# Patient Record
Sex: Female | Born: 1955 | Race: White | Hispanic: No | Marital: Married | Smoking: Never smoker
Health system: Southern US, Community
[De-identification: ages and names within clinical notes are randomized; demographics above are authoritative.]

## PROBLEM LIST (undated history)

## (undated) DIAGNOSIS — I341 Nonrheumatic mitral (valve) prolapse: Secondary | ICD-10-CM

## (undated) DIAGNOSIS — K219 Gastro-esophageal reflux disease without esophagitis: Secondary | ICD-10-CM

## (undated) DIAGNOSIS — H409 Unspecified glaucoma: Secondary | ICD-10-CM

## (undated) DIAGNOSIS — G629 Polyneuropathy, unspecified: Secondary | ICD-10-CM

## (undated) DIAGNOSIS — M810 Age-related osteoporosis without current pathological fracture: Secondary | ICD-10-CM

## (undated) DIAGNOSIS — E785 Hyperlipidemia, unspecified: Secondary | ICD-10-CM

## (undated) DIAGNOSIS — M419 Scoliosis, unspecified: Secondary | ICD-10-CM

## (undated) DIAGNOSIS — K589 Irritable bowel syndrome without diarrhea: Secondary | ICD-10-CM

## (undated) DIAGNOSIS — R011 Cardiac murmur, unspecified: Secondary | ICD-10-CM

## (undated) DIAGNOSIS — F419 Anxiety disorder, unspecified: Secondary | ICD-10-CM

## (undated) HISTORY — DX: Nonrheumatic mitral (valve) prolapse: I34.1

## (undated) HISTORY — DX: Age-related osteoporosis without current pathological fracture: M81.0

## (undated) HISTORY — DX: Scoliosis, unspecified: M41.9

## (undated) HISTORY — DX: Hyperlipidemia, unspecified: E78.5

## (undated) HISTORY — DX: Irritable bowel syndrome, unspecified: K58.9

## (undated) HISTORY — DX: Polyneuropathy, unspecified: G62.9

## (undated) HISTORY — DX: Unspecified glaucoma: H40.9

## (undated) HISTORY — PX: TONSILLECTOMY: SUR1361

---

## 1999-01-03 ENCOUNTER — Other Ambulatory Visit: Admission: RE | Admit: 1999-01-03 | Discharge: 1999-01-03 | Payer: Self-pay | Admitting: Obstetrics and Gynecology

## 2000-11-16 ENCOUNTER — Other Ambulatory Visit: Admission: RE | Admit: 2000-11-16 | Discharge: 2000-11-16 | Payer: Self-pay | Admitting: *Deleted

## 2000-11-30 ENCOUNTER — Encounter: Payer: Self-pay | Admitting: *Deleted

## 2000-11-30 ENCOUNTER — Ambulatory Visit (HOSPITAL_COMMUNITY): Admission: RE | Admit: 2000-11-30 | Discharge: 2000-11-30 | Payer: Self-pay | Admitting: *Deleted

## 2000-12-01 ENCOUNTER — Encounter: Payer: Self-pay | Admitting: *Deleted

## 2006-07-17 ENCOUNTER — Ambulatory Visit: Payer: Self-pay | Admitting: Internal Medicine

## 2006-07-29 ENCOUNTER — Ambulatory Visit: Payer: Self-pay | Admitting: Internal Medicine

## 2006-09-29 ENCOUNTER — Ambulatory Visit: Payer: Self-pay | Admitting: Internal Medicine

## 2007-03-11 DIAGNOSIS — K829 Disease of gallbladder, unspecified: Secondary | ICD-10-CM | POA: Insufficient documentation

## 2007-04-22 ENCOUNTER — Ambulatory Visit (HOSPITAL_COMMUNITY): Admission: RE | Admit: 2007-04-22 | Discharge: 2007-04-22 | Payer: Self-pay | Admitting: Internal Medicine

## 2007-04-23 ENCOUNTER — Ambulatory Visit: Payer: Self-pay | Admitting: Gastroenterology

## 2007-04-23 LAB — CONVERTED CEMR LAB
ALT: 15 units/L (ref 0–35)
AST: 17 units/L (ref 0–37)
Albumin: 4.2 g/dL (ref 3.5–5.2)
Alkaline Phosphatase: 46 units/L (ref 39–117)
BUN: 12 mg/dL (ref 6–23)
Basophils Absolute: 0.1 10*3/uL (ref 0.0–0.1)
Basophils Relative: 1 % (ref 0.0–1.0)
CO2: 32 meq/L (ref 19–32)
Calcium: 9.3 mg/dL (ref 8.4–10.5)
Chloride: 104 meq/L (ref 96–112)
Creatinine, Ser: 0.6 mg/dL (ref 0.4–1.2)
Eosinophils Absolute: 0.3 10*3/uL (ref 0.0–0.6)
Eosinophils Relative: 4.1 % (ref 0.0–5.0)
GFR calc Af Amer: 136 mL/min
GFR calc non Af Amer: 112 mL/min
Glucose, Bld: 118 mg/dL — ABNORMAL HIGH (ref 70–99)
HCT: 43.8 % (ref 36.0–46.0)
Hemoglobin: 14.9 g/dL (ref 12.0–15.0)
Lipase: 26 units/L (ref 11.0–59.0)
Lymphocytes Relative: 30 % (ref 12.0–46.0)
MCHC: 34 g/dL (ref 30.0–36.0)
MCV: 90.5 fL (ref 78.0–100.0)
Monocytes Absolute: 0.8 10*3/uL — ABNORMAL HIGH (ref 0.2–0.7)
Monocytes Relative: 9.5 % (ref 3.0–11.0)
Neutro Abs: 4.7 10*3/uL (ref 1.4–7.7)
Neutrophils Relative %: 55.4 % (ref 43.0–77.0)
Platelets: 268 10*3/uL (ref 150–400)
Potassium: 4.2 meq/L (ref 3.5–5.1)
RBC: 4.84 M/uL (ref 3.87–5.11)
RDW: 11.7 % (ref 11.5–14.6)
Sodium: 142 meq/L (ref 135–145)
Total Bilirubin: 0.6 mg/dL (ref 0.3–1.2)
Total Protein: 7.2 g/dL (ref 6.0–8.3)
WBC: 8.4 10*3/uL (ref 4.5–10.5)

## 2007-04-30 ENCOUNTER — Ambulatory Visit (HOSPITAL_COMMUNITY): Admission: RE | Admit: 2007-04-30 | Discharge: 2007-04-30 | Payer: Self-pay | Admitting: Gastroenterology

## 2007-05-03 ENCOUNTER — Ambulatory Visit: Payer: Self-pay | Admitting: Internal Medicine

## 2007-05-24 ENCOUNTER — Ambulatory Visit: Payer: Self-pay | Admitting: Internal Medicine

## 2007-05-26 DIAGNOSIS — M479 Spondylosis, unspecified: Secondary | ICD-10-CM | POA: Insufficient documentation

## 2007-05-26 DIAGNOSIS — K2900 Acute gastritis without bleeding: Secondary | ICD-10-CM | POA: Insufficient documentation

## 2007-05-26 DIAGNOSIS — D1803 Hemangioma of intra-abdominal structures: Secondary | ICD-10-CM | POA: Insufficient documentation

## 2007-06-17 ENCOUNTER — Ambulatory Visit: Payer: Self-pay | Admitting: Internal Medicine

## 2007-06-17 ENCOUNTER — Encounter: Payer: Self-pay | Admitting: Internal Medicine

## 2010-01-30 ENCOUNTER — Other Ambulatory Visit: Admission: RE | Admit: 2010-01-30 | Discharge: 2010-01-30 | Payer: Self-pay | Admitting: Family Medicine

## 2010-03-31 ENCOUNTER — Encounter: Payer: Self-pay | Admitting: Gastroenterology

## 2010-07-23 NOTE — Assessment & Plan Note (Signed)
[facility]                         GASTROENTEROLOGY OFFICE NOTE   NAME:Nevitt, MASEY SCHEIBER                       MRN:          [id number]  DATE:09/29/2006                            DOB:          [birth year]    NEW PATIENT EVALUATION:  Ms. Millar is a very nice 55 year old patient  of Dr. Seymour Bars, who had a screening colonoscopy with [place] on Jul 29, 2006  and was found to have mild diverticulosis of the left colon.  She at  that time was complaining of some epigastric or subxiphoid discomfort  and I asked her to come and see me if she continues to have problems.  It turned out that Ms. Riel has been having epigastric discomfort for  over a year and was fully evaluated by Dr. Kinnie Scales in July 2007, with  upper endoscopy showing essentially normal anatomy, only mild gastritis  which was attributed to use of NSAIDs; she was at that time taking  ibuprofen.  Ms. Nutting was put on Zegerid 40 mg daily.  She also was  asked not to take any ibuprofen.  She took the medicine for several  weeks, stopped taking ibuprofen and her symptoms subsided.  This spring  when she had an upper respiratory infection and subsequently some sinus  trouble, she resumed taking her ibuprofen and has been through at least  1 or 2 bottles.  When she wakes up in the morning, she experiences  subxiphoid discomfort anteriorly which does not radiate to the back.  She has no other symptoms such as dyspepsia; in fact, eating usually  makes the symptoms better.  She denies excessive stress.  There has been  no dysphagia or odynophagia.  Biopsies on the upper endoscopy did not  show evidence of H. pylori or Barrett's esophagus.   MEDICATIONS:  None.   PAST HISTORY:  1. Allergies.  2. Tonsillectomy at age of 46.  3. Borderline thyroid problems.  4. Gestational diabetes.   FAMILY HISTORY:  Negative for colon cancer.  Heart disease present in  father, uncle and grandmother.   SOCIAL HISTORY:   Married with 2 children.  Master's degree in [employer].  She does not smoke and does not drink.   REVIEW OF SYSTEMS:  Positive for contact lenses, allergies, swelling of  her feet, sleeping problems, night sweats, back pain and heart murmur.   PHYSICAL EXAMINATION:  Blood pressure 100/58, pulse 100 and weight 135  pounds.  She was healthy-appearing, in no distress.  She was about 15 pounds over  her usual weight.  LUNGS:  Clear to auscultation.  No rales or wheezes.  NECK:  Supple.  Thyroid was not enlarged.  Her voice was normal.  COR:  Quiet precordium, normal S1 and normal S2.  CHEST:  Pressure over the costochondral junction did not appear to cause  any pain.  ABDOMEN:  Soft with mild discomfort in the epigastrium and subxiphoid  area in the midline.  It does not radiate to left or right upper  quadrant.  Liver edge at costal margin.  No scars in the abdomen.  Lower  abdomen  was normal.  RECTAL:  Exam not done.   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  Location of the pain suggests  that she has some distal esophagitis or possibly small hiatal hernia.  Her gastroesophageal reflux which occurs at night may be causing morning  pain.  There is nothing to suggest nocturnal aspiration.  The use of  nonsteroidal anti-inflammatory drugs clearly aggravate her  symptomatology and the nonsteroidal anti-inflammatory drugs ought to be  avoided entirely.   PLAN:  I have discussed the etiology of her pain with the patient.  First of all, she will:  1. Stop taking ibuprofen and use Tylenol as needed.  2. Protonix 40 mg daily for about 4 weeks and take it continuously      until pain goes away.  If the pain does not go away, she needs to      come back.  3. Antireflux measures.  4. Patient asked if other organs could be involved in this  and I      mentioned the possibility of gallbladder disease or pancreatitis,      although her symptoms are not typical at all.  If her symptoms      continue, upper abdominal ultrasound would be the next step.  I      also advised her to lose about 10 pounds, which is going to help      her gastroesophageal reflux.     Hedwig Morton. Juanda Chance, MD  Electronically Signed    DMB/MedQ  DD: 09/29/2006  DT: 09/30/2006  Job #: [id number]   cc:   Genia Del, M.D.

## 2010-07-23 NOTE — Assessment & Plan Note (Signed)
[facility]                         GASTROENTEROLOGY OFFICE NOTE   NAME:[name], [name]                       MRN:          [id number]  DATE:05/24/2007                            DOB:          05/09/1955    [name] is a 55 year old white female with epigastric discomfort.  We  saw her with the same problem in the summer of 2008.  Then again, she  had exacerbation about a month ago when she saw [name] on an add-on basis.  The patient had increased her Protonix from 40 mg a day to b.i.d. dose,  and was given a trial on Bentyl.  Symptoms have somewhat improved, but  not significantly.  She is having almost constant discomfort in  subxiphoid area in the midline, radiating into interscapular area, but  most of the time remains anterior.  It bothers her at night.  It is  worse after a large meal.  HIDA scan done with half and half showed only  8.4% ejection fraction.  The half and half did not seem to reproduce her  symptoms.  Increasing her Protonix to 40 mg b.i.d. did not seem to help  any of her symptoms.  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  This  was confirmed on CT scan of the abdomen.  The patient has been under a  great deal of stress related to her husband's work as well as her own  work, and as well as her children.  Her weight has been stable.  She  does not smoke, does not drink alcohol.  She denies taking NSAIDs or  aspirin.   PHYSICAL EXAMINATION:  Blood pressure 100/62, pulse 72, and weight 137  pounds.  She was alert, oriented, in no distress.  LUNGS:  Clear to auscultation.  COR:  Normal S1, normal S2.  ABDOMEN:  Soft with tenderness in the midline subxiphoid area, somewhat  to the right of the midline, but not radiating to any of the upper  quadrants on the left or on the right or to the costovertebral angle.  Liver edge was at costal margin and it did not  appear to be tender.  Lower abdomen was normal.   LABORATORY DATA:  On April 22, 2002 were all normal with normal liver  function test, lipase and CBC.   IMPRESSION:  A 55 year old white female with persistent epigastric  discomfort.  Prior history of gastritis on endoscopy April 2007.  She  has a very abnormal-appearing gallbladder function, having an ejection  fraction of 8.4%, but her symptoms are not really typical of gallbladder  dysfunction.  I still feel we are most likely dealing with either non-  ulcerative dyspepsia or gastroesophageal reflux.   PLAN:  1. Upper endoscopy scheduled.  2. Add Carafate slurry 2 tsp t.i.d.  3. Stay on low-fat diet.  4. May decrease Protonix to 40 mg daily.     [name]. [name] Chance, MD  Electronically Signed    DMB/MedQ  DD: 05/24/2007  DT: 05/24/2007  Job #: [id number]

## 2010-07-23 NOTE — Assessment & Plan Note (Signed)
[facility]                         GASTROENTEROLOGY OFFICE NOTE   NAME:[name], [name] LAVIGNE                       MRN:          [id number]  DATE:04/23/2007                            DOB:          1955-12-18    PROBLEM:  Epigastric pain.   HISTORY:  The patient is a pleasant 55 year old white female known to  [name]. [name], M.D., who was last seen in our office in July of 2008.  She has had problems with persistent epigastric and subxiphoid  discomfort which has been present for at least the past year and a half.  She had undergone endoscopy in July of 2007 with [name],  M.D., which showed normal anatomy and mild gastritis.  H. Pylori was  negative.  At that time it was felt that she had gastritis related to  NSAIDS and was started on PPI therapy.  The patient says she has been  very vigilant about not taking any NSAIDS since that time and has  continued on chronic PPI therapy, currently on Protonix 40 mg daily.  She says she has become worried recently because despite taking the  medication, she continues to have pain and wants to be sure that there  is not some other problems causing her pain.  She has been tried on  Bentyl as well 10 mg twice daily which she is taking, but has not  noticed any improvement in her symptoms.  Her pain is not severe.  She  says it is tolerable, but she is worried.  She has occasional heartburn  and indigestion which she says is a distinctly different feeling from  this epigastric pain.  She describes her pain as a squeezing type  pressure in the epigastrium which is usually present everyday, worse in  the morning on arising and not as noticeable when she is busy during the  day.  She says it generally is exacerbated by eating a lot, but not by  any particular food.  Her appetite has been good.  She has actually  gained some weight recently, particularly she feels in her abdomen.  She  is not having any nausea  and vomiting.  Her bowel movements have been  normal.  She has not noted any melena or hematochezia.  Occasionally has  some problems with constipation.  Again her concern is that the pain  persists and is present on a daily basis.   CURRENT MEDICATIONS:  1. Protonix 40 mg daily.  2. Bentyl 10 mg b.i.d.   ALLERGIES:  No known drug allergies.   PHYSICAL EXAMINATION:  GENERAL:  A well-developed white female in no  acute distress.  VITAL SIGNS:  Weight 137.4, blood pressure 100/62, pulse 80.  HEART:  Regular rate and rhythm with S1 and S2.  No murmurs, rubs, or  gallops.  LUNGS:  Clear to A&P.  ABDOMEN:  Soft.  Bowel sounds are active.  She is tender in the  epigastrium.  There is no chest wall tenderness and no xiphoid or  sternal tenderness elicited.  NO palpable mass or hepatosplenomegaly.  No guarding or rebound.  Bowel sounds are present.  RECTAL:  Not done today.   IMPRESSION:  25. A 55 year old female with persistent epigastric pain x1-1/2 years.      Etiology not clear.  Symptoms possibly related to gastroesophageal      reflux disease or esophageal spasm.  2. Persistent gastropathy, rule out occult intra-abdominal lesion.      Rule out biliary dyskinesia.   PLAN:  1. Increase Protonix to 40 mg b.i.d. as a trial for the next 30 days.  2. Check CBC, CMET, and lipase.  3. Schedule [facility] HIDA scan and CT scan of the abdomen and pelvis.      Further workup pending findings of above.  She will follow up with      Dr. Juanda [name].      [name] Gip, PA-C  Electronically Signed      [name]. [name] Dar, MD, [facility]  Electronically Signed   AE/MedQ  DD: 04/23/2007  DT: 04/26/2007  Job #: [id number]   cc:   [name]. [name] Chance, MD

## 2012-04-12 ENCOUNTER — Other Ambulatory Visit: Payer: Self-pay | Admitting: Dermatology

## 2013-11-30 ENCOUNTER — Encounter (HOSPITAL_COMMUNITY): Payer: Self-pay | Admitting: Pharmacy Technician

## 2013-11-30 NOTE — Pre-Procedure Instructions (Signed)
[name]  11/30/2013   Your procedure is scheduled on:  Friday, December 09, 2013 at 10:30 AM.   Report to [facility] Entrance "A" Admitting Office at 8:30 AM.   Call this number if you have problems the morning of surgery: [phone]   Remember:   Do not eat food or drink liquids after midnight Thursday, 12/08/13.   Take these medicines the morning of surgery with A SIP OF WATER: You may use your eye drops   Do not wear jewelry, make-up or nail polish.  Do not wear lotions, powders, or perfumes. You may NOT wear deodorant.  Do not shave 48 hours prior to surgery.   Do not bring valuables to the hospital.  [facility] is not responsible                  for any belongings or valuables.               Contacts, dentures or bridgework may not be worn into surgery.  Leave suitcase in the car. After surgery it may be brought to your room.  For patients admitted to the hospital, discharge time is determined by your                treatment team.               Patients discharged the day of surgery will not be allowed to drive home.    Special Instructions: [facility] - Preparing for Surgery  Before surgery, you can play an important role.  Because skin is not sterile, your skin needs to be as free of germs as possible.  You can reduce the number of germs on you skin by washing with CHG (chlorahexidine gluconate) soap before surgery.  CHG is an antiseptic cleaner which kills germs and bonds with the skin to continue killing germs even after washing.  Please DO NOT use if you have an allergy to CHG or antibacterial soaps.  If your skin becomes reddened/irritated stop using the CHG and inform your nurse when you arrive at Short Stay.  Do not shave (including legs and underarms) for at least 48 hours prior to the first CHG shower.  You may shave your face.  Please follow these instructions carefully:   1.  Shower with CHG Soap the night before surgery and the                                 morning of Surgery.  2.  If you choose to wash your hair, wash your hair first as usual with your       normal shampoo.  3.  After you shampoo, rinse your hair and body thoroughly to remove the                      Shampoo.  4.  Use CHG as you would any other liquid soap.  You can apply chg directly       to the skin and wash gently with scrungie or a clean washcloth.  5.  Apply the CHG Soap to your body ONLY FROM THE NECK DOWN.        Do not use on open wounds or open sores.  Avoid contact with your eyes, ears, mouth and genitals (private parts).  Wash genitals (private parts) with your normal soap.  6.  Wash thoroughly, paying special  attention to the area where your surgery        will be performed.  7.  Thoroughly rinse your body with warm water from the neck down.  8.  DO NOT shower/wash with your normal soap after using and rinsing off       the CHG Soap.  9.  Pat yourself dry with a clean towel.            10.  Wear clean pajamas.            11.  Place clean sheets on your bed the night of your first shower and do not        sleep with pets.  Day of Surgery  Do not apply any lotions/deodorants the morning of surgery.  Please wear clean clothes to the hospital/surgery center.     Please read over the following fact sheets that you were given: Pain Booklet, Coughing and Deep Breathing and Surgical Site Infection Prevention

## 2013-12-01 ENCOUNTER — Encounter (HOSPITAL_COMMUNITY)
Admission: RE | Admit: 2013-12-01 | Discharge: 2013-12-01 | Disposition: A | Discharge status: Home / Self Care | Payer: 59 | Source: Ambulatory Visit | Attending: Orthopedic Surgery | Admitting: Orthopedic Surgery

## 2013-12-01 ENCOUNTER — Encounter (HOSPITAL_COMMUNITY): Payer: Self-pay

## 2013-12-01 DIAGNOSIS — X58XXXA Exposure to other specified factors, initial encounter: Secondary | ICD-10-CM | POA: Insufficient documentation

## 2013-12-01 DIAGNOSIS — M67919 Unspecified disorder of synovium and tendon, unspecified shoulder: Secondary | ICD-10-CM | POA: Insufficient documentation

## 2013-12-01 DIAGNOSIS — S43439A Superior glenoid labrum lesion of unspecified shoulder, initial encounter: Secondary | ICD-10-CM | POA: Insufficient documentation

## 2013-12-01 DIAGNOSIS — M719 Bursopathy, unspecified: Principal | ICD-10-CM | POA: Insufficient documentation

## 2013-12-01 DIAGNOSIS — Z01812 Encounter for preprocedural laboratory examination: Secondary | ICD-10-CM | POA: Insufficient documentation

## 2013-12-01 HISTORY — DX: Anxiety disorder, unspecified: F41.9

## 2013-12-01 HISTORY — DX: Gastro-esophageal reflux disease without esophagitis: K21.9

## 2013-12-01 LAB — CBC
HEMATOCRIT: 44.7 % (ref 36.0–46.0)
Hemoglobin: 15 g/dL (ref 12.0–15.0)
MCH: 30.1 pg (ref 26.0–34.0)
MCHC: 33.6 g/dL (ref 30.0–36.0)
MCV: 89.6 fL (ref 78.0–100.0)
Platelets: 294 10*3/uL (ref 150–400)
RBC: 4.99 MIL/uL (ref 3.87–5.11)
RDW: 12.5 % (ref 11.5–15.5)
WBC: 6.2 10*3/uL (ref 4.0–10.5)

## 2013-12-01 NOTE — Progress Notes (Signed)
DR. Veverly Fells OFFICE CALLED FOR ORDERS.

## 2013-12-06 NOTE — H&P (Signed)
  [name] is an 58 y.o. female.    Chief Complaint: right shoulder pain and weakness  HPI: Pt is a 58 y.o. female complaining of right shoulder pain for multiple months. Pain had continually increased since the beginning. X-rays in the clinic show rotator cuff tear right shoulder. Pt has tried various conservative treatments which have failed to alleviate their symptoms, including injections and therapy. Various options are discussed with the patient. Risks, benefits and expectations were discussed with the patient. Patient understand the risks, benefits and expectations and wishes to proceed with surgery.   PCP:  Marjorie Smolder, MD  D/C Plans:  Home   PMH: Past Medical History  Diagnosis Date  . Anxiety     very high  . GERD (gastroesophageal reflux disease)     PSH: Past Surgical History  Procedure Laterality Date  . Cesarean section    . Tonsillectomy      Social History:  reports that she has never smoked. She does not have any smokeless tobacco history on file. She reports that she drinks alcohol. She reports that she does not use illicit drugs.  Allergies:  No Known Allergies  Medications: No current facility-administered medications for this encounter.   Current Outpatient Prescriptions  Medication Sig Dispense Refill  . calcium carbonate (TUMS - DOSED IN MG ELEMENTAL CALCIUM) 500 MG chewable tablet Chew 1 tablet by mouth daily as needed for indigestion or heartburn.      . Cholecalciferol (VITAMIN D) 2000 UNITS CAPS Take 2,000 Units by mouth daily.      . cycloSPORINE (RESTASIS) 0.05 % ophthalmic emulsion Place 1 drop into both eyes 2 (two) times daily.      Marland Kitchen aspirin 81 MG chewable tablet Chew 81 mg by mouth daily.      . Omega-3 Fatty Acids (FISH OIL PO) Take 1 capsule by mouth daily.        No results found for this or any previous visit (from the past 48 hour(s)). No results found.  ROS: Pain with rom of the right upper extremity  Physical  Exam:  Alert and oriented 58 y.o. female in no acute distress Cranial nerves 2-12 intact Cervical spine: full rom with no tenderness, nv intact distally Chest: active breath sounds bilaterally, no wheeze rhonchi or rales Heart: regular rate and rhythm, no murmur Abd: non tender non distended with active bowel sounds Hip is stable with rom  Right shoulder with adequate rom Weakness and pain with ER and IR testing  nv intact distally No rashes or edema  Assessment/Plan Assessment: right shoulder rotator cuff tear  Plan: Patient will undergo a right shoulder rotator cuff repair by Dr. Veverly Fells at [facility]. Risks benefits and expectations were discussed with the patient. Patient understand risks, benefits and expectations and wishes to proceed.

## 2013-12-08 MED ORDER — CEFAZOLIN SODIUM-DEXTROSE 2-3 GM-% IV SOLR
2.0000 g | INTRAVENOUS | Status: AC
  Administered 2013-12-09: 2 g via INTRAVENOUS
  Filled 2013-12-08: qty 50

## 2013-12-09 ENCOUNTER — Encounter (HOSPITAL_COMMUNITY): Payer: 59 | Admitting: Anesthesiology

## 2013-12-09 ENCOUNTER — Encounter (HOSPITAL_COMMUNITY): Payer: Self-pay | Admitting: *Deleted

## 2013-12-09 ENCOUNTER — Ambulatory Visit (HOSPITAL_COMMUNITY)
Admission: RE | Admit: 2013-12-09 | Discharge: 2013-12-09 | Disposition: A | Discharge status: Home / Self Care | Payer: 59 | Source: Ambulatory Visit | Attending: Orthopedic Surgery | Admitting: Orthopedic Surgery

## 2013-12-09 ENCOUNTER — Encounter (HOSPITAL_COMMUNITY)
Admission: RE | Disposition: A | Discharge status: Home / Self Care | Payer: Self-pay | Source: Ambulatory Visit | Attending: Orthopedic Surgery

## 2013-12-09 ENCOUNTER — Ambulatory Visit (HOSPITAL_COMMUNITY): Payer: 59 | Admitting: Anesthesiology

## 2013-12-09 DIAGNOSIS — K219 Gastro-esophageal reflux disease without esophagitis: Secondary | ICD-10-CM | POA: Insufficient documentation

## 2013-12-09 DIAGNOSIS — S43431A Superior glenoid labrum lesion of right shoulder, initial encounter: Secondary | ICD-10-CM | POA: Diagnosis not present

## 2013-12-09 DIAGNOSIS — F419 Anxiety disorder, unspecified: Secondary | ICD-10-CM | POA: Insufficient documentation

## 2013-12-09 DIAGNOSIS — M75101 Unspecified rotator cuff tear or rupture of right shoulder, not specified as traumatic: Secondary | ICD-10-CM | POA: Insufficient documentation

## 2013-12-09 DIAGNOSIS — X58XXXA Exposure to other specified factors, initial encounter: Secondary | ICD-10-CM | POA: Insufficient documentation

## 2013-12-09 HISTORY — DX: Cardiac murmur, unspecified: R01.1

## 2013-12-09 HISTORY — PX: SHOULDER ARTHROSCOPY WITH SUBACROMIAL DECOMPRESSION AND OPEN ROTATOR C: SHX5688

## 2013-12-09 SURGERY — SHOULDER ARTHROSCOPY WITH SUBACROMIAL DECOMPRESSION AND OPEN ROTATOR CUFF REPAIR, OPEN BICEPS TENDON REPAIR
Anesthesia: General | Site: Shoulder | Laterality: Right

## 2013-12-09 MED ORDER — KETOROLAC TROMETHAMINE 30 MG/ML IJ SOLN
30.0000 mg | Freq: Once | INTRAMUSCULAR | Status: AC
  Administered 2013-12-09: 30 mg via INTRAVENOUS

## 2013-12-09 MED ORDER — SUCCINYLCHOLINE CHLORIDE 20 MG/ML IJ SOLN
INTRAMUSCULAR | Status: DC | PRN
  Administered 2013-12-09: 100 mg via INTRAVENOUS

## 2013-12-09 MED ORDER — OXYCODONE-ACETAMINOPHEN 5-325 MG PO TABS: 1.0000 | ORAL_TABLET | ORAL | Status: DC | PRN

## 2013-12-09 MED ORDER — LIDOCAINE HCL (CARDIAC) 20 MG/ML IV SOLN
INTRAVENOUS | Status: AC
  Filled 2013-12-09: qty 5

## 2013-12-09 MED ORDER — LIDOCAINE HCL (CARDIAC) 20 MG/ML IV SOLN
INTRAVENOUS | Status: DC | PRN
  Administered 2013-12-09: 30 mg via INTRAVENOUS

## 2013-12-09 MED ORDER — BUPIVACAINE-EPINEPHRINE 0.25% -1:200000 IJ SOLN
INTRAMUSCULAR | Status: DC | PRN
  Administered 2013-12-09: 10 mL

## 2013-12-09 MED ORDER — HYDROMORPHONE HCL 2 MG PO TABS: 2.0000 mg | ORAL_TABLET | ORAL | Status: DC | PRN

## 2013-12-09 MED ORDER — BUPIVACAINE-EPINEPHRINE (PF) 0.25% -1:200000 IJ SOLN
INTRAMUSCULAR | Status: AC
  Filled 2013-12-09: qty 30

## 2013-12-09 MED ORDER — METHOCARBAMOL 500 MG PO TABS
500.0000 mg | ORAL_TABLET | Freq: Once | ORAL | Status: AC
  Administered 2013-12-09: 500 mg via ORAL

## 2013-12-09 MED ORDER — BUPIVACAINE-EPINEPHRINE (PF) 0.5% -1:200000 IJ SOLN
INTRAMUSCULAR | Status: DC | PRN
  Administered 2013-12-09: 20 mL via PERINEURAL

## 2013-12-09 MED ORDER — ROCURONIUM BROMIDE 50 MG/5ML IV SOLN
INTRAVENOUS | Status: AC
  Filled 2013-12-09: qty 1

## 2013-12-09 MED ORDER — LACTATED RINGERS IV SOLN
INTRAVENOUS | Status: DC
  Administered 2013-12-09: 09:00:00 via INTRAVENOUS

## 2013-12-09 MED ORDER — FENTANYL CITRATE 0.05 MG/ML IJ SOLN
50.0000 ug | INTRAMUSCULAR | Status: DC | PRN
  Administered 2013-12-09: 75 ug via INTRAVENOUS

## 2013-12-09 MED ORDER — CHLORHEXIDINE GLUCONATE 4 % EX LIQD
60.0000 mL | Freq: Once | CUTANEOUS | Status: DC
  Filled 2013-12-09: qty 60

## 2013-12-09 MED ORDER — FENTANYL CITRATE 0.05 MG/ML IJ SOLN
INTRAMUSCULAR | Status: AC
  Filled 2013-12-09: qty 5

## 2013-12-09 MED ORDER — PROPOFOL 10 MG/ML IV BOLUS
INTRAVENOUS | Status: AC
  Filled 2013-12-09: qty 20

## 2013-12-09 MED ORDER — KETOROLAC TROMETHAMINE 30 MG/ML IJ SOLN
INTRAMUSCULAR | Status: AC
  Filled 2013-12-09: qty 1

## 2013-12-09 MED ORDER — OXYCODONE HCL 5 MG PO TABS
5.0000 mg | ORAL_TABLET | Freq: Once | ORAL | Status: AC | PRN
  Administered 2013-12-09: 5 mg via ORAL

## 2013-12-09 MED ORDER — FENTANYL CITRATE 0.05 MG/ML IJ SOLN
INTRAMUSCULAR | Status: AC
  Administered 2013-12-09: 75 ug via INTRAVENOUS
  Filled 2013-12-09: qty 2

## 2013-12-09 MED ORDER — OXYCODONE-ACETAMINOPHEN 5-325 MG PO TABS
ORAL_TABLET | ORAL | Status: AC
  Filled 2013-12-09: qty 1

## 2013-12-09 MED ORDER — OXYCODONE HCL 5 MG/5ML PO SOLN: 5.0000 mg | Freq: Once | ORAL | Status: AC | PRN

## 2013-12-09 MED ORDER — MIDAZOLAM HCL 2 MG/2ML IJ SOLN
INTRAMUSCULAR | Status: AC
  Administered 2013-12-09: 1.5 mg via INTRAVENOUS
  Filled 2013-12-09: qty 2

## 2013-12-09 MED ORDER — FENTANYL CITRATE 0.05 MG/ML IJ SOLN
INTRAMUSCULAR | Status: DC | PRN
  Administered 2013-12-09: 50 ug via INTRAVENOUS
  Administered 2013-12-09: 100 ug via INTRAVENOUS

## 2013-12-09 MED ORDER — PROPOFOL 10 MG/ML IV BOLUS
INTRAVENOUS | Status: DC | PRN
  Administered 2013-12-09: 160 mg via INTRAVENOUS

## 2013-12-09 MED ORDER — METHOCARBAMOL 500 MG PO TABS
ORAL_TABLET | ORAL | Status: AC
  Filled 2013-12-09: qty 1

## 2013-12-09 MED ORDER — MIDAZOLAM HCL 2 MG/2ML IJ SOLN
1.0000 mg | INTRAMUSCULAR | Status: DC | PRN
  Administered 2013-12-09: 1.5 mg via INTRAVENOUS

## 2013-12-09 MED ORDER — OXYCODONE HCL 5 MG PO TABS
ORAL_TABLET | ORAL | Status: DC
Start: 2013-12-09 — End: 2013-12-09
  Filled 2013-12-09: qty 1

## 2013-12-09 MED ORDER — PHENYLEPHRINE HCL 10 MG/ML IJ SOLN
10.0000 mg | INTRAMUSCULAR | Status: DC | PRN
  Administered 2013-12-09: 20 ug/min via INTRAVENOUS

## 2013-12-09 MED ORDER — PROMETHAZINE HCL 25 MG/ML IJ SOLN: 6.2500 mg | INTRAMUSCULAR | Status: DC | PRN

## 2013-12-09 MED ORDER — PROMETHAZINE HCL 25 MG PO TABS: 25.0000 mg | ORAL_TABLET | Freq: Four times a day (QID) | ORAL | Status: DC | PRN

## 2013-12-09 MED ORDER — SODIUM CHLORIDE 0.9 % IR SOLN
Status: DC | PRN
  Administered 2013-12-09: 6000 mL

## 2013-12-09 MED ORDER — ONDANSETRON HCL 4 MG/2ML IJ SOLN
INTRAMUSCULAR | Status: DC | PRN
  Administered 2013-12-09: 4 mg via INTRAVENOUS

## 2013-12-09 MED ORDER — ONDANSETRON HCL 4 MG/2ML IJ SOLN
INTRAMUSCULAR | Status: AC
  Filled 2013-12-09: qty 2

## 2013-12-09 MED ORDER — HYDROMORPHONE HCL 1 MG/ML IJ SOLN: 0.2500 mg | INTRAMUSCULAR | Status: DC | PRN

## 2013-12-09 MED ORDER — METHOCARBAMOL 500 MG PO TABS: 500.0000 mg | ORAL_TABLET | Freq: Three times a day (TID) | ORAL | Status: DC | PRN

## 2013-12-09 SURGICAL SUPPLY — 67 items
ANCH SUT 2 1.3X1 LD 1 STRN (Anchor) ×1 IMPLANT
ANCH SUT 360D 2 2 LD 2 STRN (Anchor) ×1 IMPLANT
ANCHOR ALL- SUT RC 2 SUT Y-K (Anchor) ×1 IMPLANT
ANCHOR ALL-SUT FLEX 1.3 Y-KNOT (Anchor) ×1 IMPLANT
ANCHOR ALL-SUT RC 2 SUT Y-K (Anchor) IMPLANT
BIT DRILL 1.3M DISPOSABLE (BIT) ×1 IMPLANT
BLADE SURG 11 STRL SS (BLADE) ×2 IMPLANT
BLADE W/14.0X25.5MM (BLADE) ×2 IMPLANT
BUR OVAL 4.0 (BURR) IMPLANT
CLSR STERI-STRIP ANTIMIC 1/2X4 (GAUZE/BANDAGES/DRESSINGS) ×1 IMPLANT
COVER SURGICAL LIGHT HANDLE (MISCELLANEOUS) ×2 IMPLANT
DRAPE INCISE IOBAN 66X45 STRL (DRAPES) ×2 IMPLANT
DRAPE STERI 35X30 U-POUCH (DRAPES) ×2 IMPLANT
DRAPE U-SHAPE 47X51 STRL (DRAPES) ×2 IMPLANT
DRSG ADAPTIC 3X8 NADH LF (GAUZE/BANDAGES/DRESSINGS) ×1 IMPLANT
DRSG EMULSION OIL 3X3 NADH (GAUZE/BANDAGES/DRESSINGS) ×4 IMPLANT
DRSG PAD ABDOMINAL 8X10 ST (GAUZE/BANDAGES/DRESSINGS) ×3 IMPLANT
DURAPREP 26ML APPLICATOR (WOUND CARE) ×2 IMPLANT
ELECT NDL TIP 2.8 STRL (NEEDLE) ×1 IMPLANT
ELECT NEEDLE TIP 2.8 STRL (NEEDLE) ×2 IMPLANT
ELECT REM PT RETURN 9FT ADLT (ELECTROSURGICAL) ×2
ELECTRODE REM PT RTRN 9FT ADLT (ELECTROSURGICAL) IMPLANT
GAUZE SPONGE 4X4 12PLY STRL (GAUZE/BANDAGES/DRESSINGS) ×2 IMPLANT
GLOVE BIOGEL PI ORTHO PRO 7.5 (GLOVE) ×1
GLOVE BIOGEL PI ORTHO PRO SZ8 (GLOVE) ×1
GLOVE ORTHO TXT STRL SZ7.5 (GLOVE) ×2 IMPLANT
GLOVE PI ORTHO PRO STRL 7.5 (GLOVE) ×1 IMPLANT
GLOVE PI ORTHO PRO STRL SZ8 (GLOVE) ×1 IMPLANT
GLOVE SURG ORTHO 8.5 STRL (GLOVE) ×2 IMPLANT
GOWN STRL REUS W/ TWL XL LVL3 (GOWN DISPOSABLE) ×4 IMPLANT
GOWN STRL REUS W/TWL XL LVL3 (GOWN DISPOSABLE) ×8
KIT BASIN OR (CUSTOM PROCEDURE TRAY) ×2 IMPLANT
KIT ROOM TURNOVER OR (KITS) ×2 IMPLANT
MANIFOLD NEPTUNE II (INSTRUMENTS) ×2 IMPLANT
NDL HYPO 25GX1X1/2 BEV (NEEDLE) ×1 IMPLANT
NDL SPNL 18GX3.5 QUINCKE PK (NEEDLE) ×1 IMPLANT
NDL SUT .5 MAYO 1.404X.05X (NEEDLE) ×1 IMPLANT
NDL SUT 6 .5 CRC .975X.05 MAYO (NEEDLE) ×1 IMPLANT
NEEDLE HYPO 25GX1X1/2 BEV (NEEDLE) ×2 IMPLANT
NEEDLE MAYO TAPER (NEEDLE) ×4
NEEDLE SPNL 18GX3.5 QUINCKE PK (NEEDLE) ×2 IMPLANT
NS IRRIG 1000ML POUR BTL (IV SOLUTION) ×2 IMPLANT
PACK SHOULDER (CUSTOM PROCEDURE TRAY) ×2 IMPLANT
PAD ARMBOARD 7.5X6 YLW CONV (MISCELLANEOUS) ×4 IMPLANT
RESECTOR FULL RADIUS 4.2MM (BLADE) ×2 IMPLANT
SET ARTHROSCOPY TUBING (MISCELLANEOUS) ×2
SET ARTHROSCOPY TUBING LN (MISCELLANEOUS) ×1 IMPLANT
SLING ARM LRG ADULT FOAM STRAP (SOFTGOODS) ×2 IMPLANT
SLING ARM MED ADULT FOAM STRAP (SOFTGOODS) IMPLANT
STRIP CLOSURE SKIN 1/2X4 (GAUZE/BANDAGES/DRESSINGS) ×2 IMPLANT
SUT BONE WAX W31G (SUTURE) ×2 IMPLANT
SUT FIBERWIRE #2 38 T-5 BLUE (SUTURE)
SUT MNCRL AB 3-0 PS2 18 (SUTURE) ×2 IMPLANT
SUT VIC AB 0 CT1 27 (SUTURE) ×2
SUT VIC AB 0 CT1 27XBRD ANBCTR (SUTURE) ×1 IMPLANT
SUT VIC AB 0 CT2 27 (SUTURE) IMPLANT
SUT VIC AB 2-0 CT1 27 (SUTURE) ×2
SUT VIC AB 2-0 CT1 TAPERPNT 27 (SUTURE) ×1 IMPLANT
SUT VICRYL 0 CT 1 36IN (SUTURE) ×2 IMPLANT
SUTURE FIBERWR #2 38 T-5 BLUE (SUTURE) IMPLANT
SYR CONTROL 10ML LL (SYRINGE) ×2 IMPLANT
TAPE CLOTH SURG 6X10 WHT LF (GAUZE/BANDAGES/DRESSINGS) ×1 IMPLANT
TOWEL OR 17X24 6PK STRL BLUE (TOWEL DISPOSABLE) ×2 IMPLANT
TOWEL OR 17X26 10 PK STRL BLUE (TOWEL DISPOSABLE) ×2 IMPLANT
TUBE CONNECTING 12X1/4 (SUCTIONS) ×2 IMPLANT
WAND HAND CNTRL MULTIVAC 90 (MISCELLANEOUS) ×2 IMPLANT
WATER STERILE IRR 1000ML POUR (IV SOLUTION) ×2 IMPLANT

## 2013-12-09 NOTE — Transfer of Care (Signed)
Immediate Anesthesia Transfer of Care Note  Patient: [name]  Procedure(s) Performed: Procedure(s): RIGHT SHOULDER ARTHROSCOPY WITH SUBACROMIAL DECOMPRESSION AND MINI OPEN ROTATOR CUFF REPAIR, AND BICEPS TENDODESIS (Right)  Patient Location: PACU  Anesthesia Type:General  Level of Consciousness: awake, alert  and oriented  Airway & Oxygen Therapy: Patient Spontanous Breathing and Patient connected to nasal cannula oxygen  Post-op Assessment: Report given to PACU RN, Post -op Vital signs reviewed and stable and Patient moving all extremities  Post vital signs: Reviewed and stable  Complications: No apparent anesthesia complications

## 2013-12-09 NOTE — Anesthesia Preprocedure Evaluation (Addendum)
Anesthesia Evaluation  Patient identified by MRN, date of birth, ID band Patient awake    Reviewed: Allergy & Precautions, H&P , NPO status , Patient's Chart, lab work & pertinent test results  History of Anesthesia Complications Negative for: history of anesthetic complications  Airway Mallampati: I      Dental  (+) Teeth Intact   Pulmonary neg pulmonary ROS,  breath sounds clear to auscultation        Cardiovascular Rhythm:Regular Rate:Normal     Neuro/Psych negative neurological ROS     GI/Hepatic GERD-  ,  Endo/Other    Renal/GU      Musculoskeletal  (+) Arthritis -,   Abdominal   Peds  Hematology   Anesthesia Other Findings   Reproductive/Obstetrics                          Anesthesia Physical Anesthesia Plan  ASA: I  Anesthesia Plan: General   Post-op Pain Management:    Induction: Intravenous  Airway Management Planned: Oral ETT  Additional Equipment:   Intra-op Plan:   Post-operative Plan: Extubation in OR  Informed Consent: I have reviewed the patients History and Physical, chart, labs and discussed the procedure including the risks, benefits and alternatives for the proposed anesthesia with the patient or authorized representative who has indicated his/her understanding and acceptance.   Dental advisory given  Plan Discussed with: CRNA and Surgeon  Anesthesia Plan Comments:        Anesthesia Quick Evaluation

## 2013-12-09 NOTE — Progress Notes (Signed)
Orthopedic Tech Progress Note Patient Details:  [name] September 21, 1955 [id number] Delivered to OR desk Ortho Devices Type of Ortho Device: Shoulder abduction pillow Ortho Device/Splint Interventions: Ordered   Asia R Thompson 12/09/2013, 1:20 PM

## 2013-12-09 NOTE — Op Note (Signed)
[name], [name]                ACCOUNT NO.:  [id number]  MEDICAL RECORD NO.:  [id number]  LOCATION:  [facility]                         FACILITY:  [facility]  PHYSICIAN:  Doran Heater. Veverly Fells, M.D. DATE OF BIRTH:  [birth year]  DATE OF PROCEDURE:  12/09/2013 DATE OF DISCHARGE:  12/09/2013                              OPERATIVE REPORT   PREOPERATIVE DIAGNOSIS:  Right shoulder rotator cuff tear and SLAP lesion.  POSTOPERATIVE DIAGNOSIS:  Right shoulder rotator cuff tear and SLAP lesion.  PROCEDURE PERFORMED:  Right shoulder arthroscopy with extensive intra- articular debridement of torn superior labrum, anterior to posterior arthroscopic biceps tenotomy, arthroscopic subacromial decompression with CA ligament release followed by mini open rotator cuff repair and biceps tenodesis in the groove.  ATTENDING SURGEON:  Doran Heater. Veverly Fells, MD.  ASSISTANT:  Abbott Pao. Dixon, PA-C, who was scrubbed the entire procedure and necessary for satisfactory completion of surgery.  ANESTHESIA:  General anesthesia was used plus interscalene block.  ESTIMATED BLOOD LOSS:  Minimal.  FLUID REPLACEMENT:  1200 mL crystalloids.  INSTRUMENT COUNTS:  Correct.  COMPLICATIONS:  There were no complications.  ANTIBIOTICS:  Perioperative antibiotics were given.  INDICATIONS:  The patient is a 58 year old female with worsening shoulder pain secondary to an MRI documented rotator cuff tear and SLAP lesion.  The patient has had progressive pain despite conservative management, desires operative treatment to restore function, eliminate pain to her shoulder.  Informed consent obtained.  DESCRIPTION OF PROCEDURE:  After an adequate level of anesthesia achieved, the patient was positioned in the modified beach-chair position.  Right shoulder was correctly identified and examined under anesthesia.  The patient had full passive range of motion.  She actually had hyper-external rotation with her elbow at her side.  The  patient had a negative sulcus, negative anterior-posterior drawer.  After sterile prep and drape of the shoulder and arm, time-out was called.  We entered the shoulder in standard portals including anterior, posterior, and lateral portals.  We identified significant tearing in the superior labral biceps junction.  We performed a biceps tenotomy and labral debridement back to stable labral rim.  Anterior-inferior and posterior- inferior labrum intact.  Articular cartilage in the shoulder normal. Subscapularis rolled edge normal.  Rotator cuff supraspinatus torn, basically with the very front edge of the supraspinatus for about 1 cm. We reversed the scope and looked posteriorly.  The teres minor and infraspinatus appeared normal.  We placed the scope in subacromial space.  Thorough bursectomy and acromioplasty was performed, creating a type 1 acromial shape with the butcher block technique utilizing a high- speed burr with a thorough decompression of rotator cuff outlet including release of the CA ligament.  Rotator cuff was clearly torn. We could see the tear from the bursal side.  We then concluded the arthroscopic portion of procedure.  We made a longitudinal incision, mini open type incision starting at the anterolateral border of the acromion, extending distally about 4 cm, dissection down to the subcutaneous tissues.  We identified the deltoid __________ between the anterior and lateral heads and divided that using needle-tip Bovie.  We placed our Arthrex retractor, identified the biceps tendon, delivered the tendon out of the soft  tissue sleeve, then prepared the floor of the biceps groove with a needle-tip Bovie and a Soil scientist.  We placed a single Y-Knot Flex anchor through the floor of the biceps groove and brought that up in a whipstitch fashion through the tendon mid tension with the elbow at 90 degrees.  We had nice low-profile biceps tenodesis. We then went ahead and  oversewed with 0 Vicryl suture figure-of-eight x2 into the pec major tendon just to reinforce the repair.  We left the sutures attached to the biceps tenodesis anchor just in case we needed to augment for the rotator cuff.  We then inspected the rotator cuff tear.  This was a interesting tear pattern.  There seem to be basically a tear that started at the rotator interval and extended posterior medially back into the supraspinatus.  Fortunately, the tendon mobilized easily back into an anatomic position without any __________.  We prepared the greater tuberosity with a rongeur and curette such that we got the bleeding bone.  We placed #2 hi-fi suture x3 into the free edge of the tendon and used that through drill holes out in the bone for the lateral part of the footprint.  We used a single Y-Knot RC anchor at the articular margin, double ligated with #2 hi-fi suture in a mattress fashion for the medial part of the footprint.  We tied all our sutures down.  We had nice anatomic repair, not under any tension.  We ranged her shoulder fully, no impingement.  Interestingly, the patient's hyper- external rotation actually became nice at about 70 degrees with a good end point, or likely the patient had not only the rotator cuff tear but also some rotator interval insufficiency as a cause for all that external rotation.  We then went ahead and checked her subscap, glided nicely, there did not seem to be any significant tethering of that rotator interval, just a tightening of that.  The motion through a neutral arc was very smooth and did not seem to be tethered or caught. We ranged the shoulder.  We thoroughly irrigated the subdeltoid interval, repaired the deltoid anatomically with 0 Vicryl suture, followed by 2-0 Vicryl subcutaneous closure and 4-0 Monocryl for skin and portals.  Steri-Strips applied followed by sterile dressing.  The patient tolerated the surgery well.     Doran Heater.  Veverly Fells, M.D.     SRN/MEDQ  D:  12/09/2013  T:  12/09/2013  Job:  [id number]

## 2013-12-09 NOTE — Discharge Instructions (Signed)
What to eat:  For your first meals, you should eat lightly; only small meals initially.  If you do not have nausea, you may eat larger meals.  Avoid spicy, greasy and heavy food.    General Anesthesia, Adult, Care After  Refer to this sheet in the next few weeks. These instructions provide you with information on caring for yourself after your procedure. Your health care provider may also give you more specific instructions. Your treatment has been planned according to current medical practices, but problems sometimes occur. Call your health care provider if you have any problems or questions after your procedure.  WHAT TO EXPECT AFTER THE PROCEDURE  After the procedure, it is typical to experience:  Sleepiness.  Nausea and vomiting. HOME CARE INSTRUCTIONS  For the first 24 hours after general anesthesia:  Have a responsible person with you.  Do not drive a car. If you are alone, do not take public transportation.  Do not drink alcohol.  Do not take medicine that has not been prescribed by your health care provider.  Do not sign important papers or make important decisions.  You may resume a normal diet and activities as directed by your health care provider.  Change bandages (dressings) as directed.  If you have questions or problems that seem related to general anesthesia, call the hospital and ask for the anesthetist or anesthesiologist on call. SEEK MEDICAL CARE IF:  You have nausea and vomiting that continue the day after anesthesia.  You develop a rash. SEEK IMMEDIATE MEDICAL CARE IF:  You have difficulty breathing.  You have chest pain.  You have any allergic problems. Document Released: 06/02/2000 Document Revised: 10/27/2012 Document Reviewed: 09/09/2012  [facility] Patient Information 2014 [place], [place].   SHOULDER INSTRUCTIONS:  Ice the shoulder constantly.  Use the sling out of the house, but please remove in the house and hug a pillow.  Ok to use the arm and hand for  light activities.  No heavy push, pull, or lift.  Keep the incision clean and dry and covered until next Wednesday, then ok to get wet in the shower. Change bandage to Band Aids on Monday before PT.  Start exercises today.  Do them every hour while awake.  Lap slides,  Door hinge exercises(rotation)  Hug self then hitchhike with thumb pointed out moving the hand away from the body while keep elbow against the pillow.  Pendulum exercises - dangle the arm and swing the hand in circles.  Ok to move the wrist and elbow as much as you would like.  Follow up in two weeks in the office  [phone]

## 2013-12-09 NOTE — Brief Op Note (Signed)
12/09/2013  12:52 PM  PATIENT:  [name]  58 y.o. female  PRE-OPERATIVE DIAGNOSIS:  Right Shoulder rotator cuff tear and SLAP lesion  POST-OPERATIVE DIAGNOSIS:  Right Shoulder rotator cuff tear and SLAP lesion  PROCEDURE:  Procedure(s): RIGHT SHOULDER ARTHROSCOPY WITH SUBACROMIAL DECOMPRESSION AND MINI OPEN ROTATOR CUFF REPAIR, AND BICEPS TENDODESIS (Right)  SURGEON:  Surgeon(s) and Role:    * Augustin Schooling, MD - Primary  PHYSICIAN ASSISTANT:   ASSISTANTS: Ventura Bruns, PA-C   ANESTHESIA:   regional and general  EBL:  Total I/O In: 1000 [I.V.:1000] Out: 25 [Blood:25]  BLOOD ADMINISTERED:none  DRAINS: none   LOCAL MEDICATIONS USED:  MARCAINE     SPECIMEN:  No Specimen  DISPOSITION OF SPECIMEN:  N/A  COUNTS:  YES  TOURNIQUET:  * No tourniquets in log *  DICTATION: .Other Dictation: Dictation Number [id number]  PLAN OF CARE: Discharge to home after PACU  PATIENT DISPOSITION:  PACU - hemodynamically stable.   Delay start of Pharmacological VTE agent (>24hrs) due to surgical blood loss or risk of bleeding: not applicable

## 2013-12-09 NOTE — Interval H&P Note (Signed)
History and Physical Interval Note:  12/09/2013 10:11 AM  [name]  has presented today for surgery, with the diagnosis of Right Shoulder rotator cuff tear and SLAP  The various methods of treatment have been discussed with the patient and family. After consideration of risks, benefits and other options for treatment, the patient has consented to  Procedure(s): RIGHT SHOULDER ARTHROSCOPY WITH SUBACROMIAL DECOMPRESSION AND MINI OPEN ROTATOR CUFF REPAIR, AND BICEPS TENDODESIS (Right) as a surgical intervention .  The patient's history has been reviewed, patient examined, no change in status, stable for surgery.  I have reviewed the patient's chart and labs.  Questions were answered to the patient's satisfaction.     Tiwan Schnitker,STEVEN R

## 2013-12-09 NOTE — Anesthesia Procedure Notes (Addendum)
Anesthesia Regional Block:  Interscalene brachial plexus block  Pre-Anesthetic Checklist: ,, timeout performed, Correct Patient, Correct Site, Correct Laterality, Correct Procedure, Correct Position, site marked, Risks and benefits discussed, at surgeon's request and post-op pain management  Laterality: Upper and Right  Prep: chloraprep and alcohol swabs       Needles:  Injection technique: Single-shot  Needle Type: Stimulator Needle - 80     Needle Length: 9cm 9 cm Needle Gauge: 22 and 22 G  Needle insertion depth: 4 cm   Additional Needles:  Procedures: ultrasound guided (picture in chart) and nerve stimulator Interscalene brachial plexus block  Nerve Stimulator or Paresthesia:  Response: Twitch elicited, 0.5 mA, 0.3 ms,   Additional Responses:   Narrative:  Start time: 12/09/2013 10:30 AM End time: 12/09/2013 10:45 AM Injection made incrementally with aspirations every 5 mL.  Performed by: Personally  Anesthesiologist: Bartolo Darter, MD  Additional Notes: Block assessed prior to start of surgery   Procedure Name: Intubation Date/Time: 12/09/2013 10:48 AM Performed by: Kyung Rudd Pre-anesthesia Checklist: Patient identified, Emergency Drugs available, Suction available, Patient being monitored and Timeout performed Patient Re-evaluated:Patient Re-evaluated prior to inductionOxygen Delivery Method: Circle system utilized Preoxygenation: Pre-oxygenation with 100% oxygen Intubation Type: IV induction Ventilation: Mask ventilation without difficulty Laryngoscope Size: Mac and 3 Grade View: Grade I Tube type: Oral Tube size: 7.0 mm Number of attempts: 1 Airway Equipment and Method: Stylet Placement Confirmation: ETT inserted through vocal cords under direct vision,  positive ETCO2 and breath sounds checked- equal and bilateral Secured at: 21 cm Tube secured with: Tape Dental Injury: Teeth and Oropharynx as per pre-operative assessment

## 2013-12-12 ENCOUNTER — Encounter (HOSPITAL_COMMUNITY): Payer: Self-pay | Admitting: Orthopedic Surgery

## 2013-12-14 NOTE — Anesthesia Postprocedure Evaluation (Signed)
  Anesthesia Post-op Note  Patient: [name]  Procedure(s) Performed: Procedure(s): RIGHT SHOULDER ARTHROSCOPY WITH SUBACROMIAL DECOMPRESSION AND MINI OPEN ROTATOR CUFF REPAIR, AND BICEPS TENDODESIS (Right)  Patient Location: PACU  Anesthesia Type:GA combined with regional for post-op pain  Level of Consciousness: awake and alert   Airway and Oxygen Therapy: Patient Spontanous Breathing  Post-op Pain: mild  Post-op Assessment: Post-op Vital signs reviewed  Post-op Vital Signs: stable  Last Vitals:  Filed Vitals:   12/09/13 1347  BP: 124/63  Pulse: 97  Temp:   Resp: 15    Complications: No apparent anesthesia complications

## 2013-12-16 NOTE — Anesthesia Postprocedure Evaluation (Signed)
  Anesthesia Post-op Note  Patient: [name]  Procedure(s) Performed: Procedure(s): RIGHT SHOULDER ARTHROSCOPY WITH SUBACROMIAL DECOMPRESSION AND MINI OPEN ROTATOR CUFF REPAIR, AND BICEPS TENDODESIS (Right)  Patient Location: PACU  Anesthesia Type:GA combined with regional for post-op pain  Level of Consciousness: awake and alert   Airway and Oxygen Therapy: Patient Spontanous Breathing  Post-op Pain: mild  Post-op Assessment: Post-op Vital signs reviewed and Patient's Cardiovascular Status Stable  Post-op Vital Signs: stable  Last Vitals:  Filed Vitals:   12/09/13 1347  BP: 124/63  Pulse: 97  Temp:   Resp: 15    Complications: No apparent anesthesia complications

## 2013-12-16 NOTE — Addendum Note (Signed)
Addendum created 12/16/13 1539 by Rica Koyanagi, MD   Modules edited: Notes Section   Notes Section:  File: [id number]

## 2014-03-10 DIAGNOSIS — G629 Polyneuropathy, unspecified: Secondary | ICD-10-CM

## 2014-03-10 HISTORY — DX: Polyneuropathy, unspecified: G62.9

## 2014-08-29 ENCOUNTER — Encounter: Payer: Self-pay | Admitting: Neurology

## 2014-08-29 ENCOUNTER — Ambulatory Visit (INDEPENDENT_AMBULATORY_CARE_PROVIDER_SITE_OTHER): Payer: 59 | Admitting: Neurology

## 2014-08-29 VITALS — BP 116/75 | HR 81 | Ht 64.5 in | Wt 139.0 lb

## 2014-08-29 DIAGNOSIS — R202 Paresthesia of skin: Secondary | ICD-10-CM | POA: Diagnosis not present

## 2014-08-29 NOTE — Progress Notes (Signed)
PATIENT: [name] DOB: April 04, 1955  Chief Complaint  Patient presents with  . Numbness    Says numbness/tingling started in right thumb only in April 2016.  The numbness has now progressed to other areas: hands, fingers, right big toe/bottom of foot, right side of face and tongue.  Symptoms are mild and intermittent.  She has not tried any medications.  She has recently had normal B12 and thyroid labs.  Diabetes has been ruled out.  She has not had any further testing.    HISTORICAL  [name] is a 59 years old right-handed female, seen referred by her primary care physician Dr. Darcus Austin, for intermittent numbness tingling involving different areas, right worse than left.  She had a history of scoliosis, with chronic back pain, had right rotator cuff surgery April 2015, she tends to sleep holding her right wrist flexion  Since April 2016, she reported frequent episodes of vocal up in the middle of the night, felt numbness in her right hand, initially only involving right thumb, later she noticed numbness involving right first 3 fingers, getting better by change position, wrist splint has been really helpful  A month later, she also noticed intermittent numbness involving her right toe, sometimes extending into the ball of her right foot, she had a few episode of woke up notice right tongue numbness, short lasting,  No visual changes, she denies gait difficulty, she also complains of frequent awakening at night time,dry mouth, fatigue, sleepiness during the day  REVIEW OF SYSTEMS: Full 14 system review of systems performed and notable only for murmur, joint pain, skin sensitivity, numbness, not enough sleep, sleepiness  ALLERGIES: No Known Allergies  HOME MEDICATIONS: Current Outpatient Prescriptions  Medication Sig Dispense Refill  . aspirin 81 MG chewable tablet Chew 81 mg by mouth daily.    . calcium carbonate (TUMS - DOSED IN MG ELEMENTAL CALCIUM) 500 MG chewable  tablet Chew 1 tablet by mouth daily as needed for indigestion or heartburn.    . Cholecalciferol (VITAMIN D) 2000 UNITS CAPS Take 2,000 Units by mouth daily.    . cycloSPORINE (RESTASIS) 0.05 % ophthalmic emulsion Place 1 drop into both eyes 2 (two) times daily.    . Omega-3 Fatty Acids (FISH OIL PO) Take 1 capsule by mouth daily.    Marland Kitchen VAGIFEM 10 MCG TABS vaginal tablet INSERT 1 TABLET VAGINALLY TWICE A WEEK  3     PAST MEDICAL HISTORY: Past Medical History  Diagnosis Date  . Anxiety     very high  . GERD (gastroesophageal reflux disease)   . Heart murmur     Pt saw Cardiologist when she was in her 60s, not since.  . Numbness and tingling     PAST SURGICAL HISTORY: Past Surgical History  Procedure Laterality Date  . Cesarean section    . Tonsillectomy    . Shoulder arthroscopy with subacromial decompression and open rotator c Right 12/09/2013    Procedure: RIGHT SHOULDER ARTHROSCOPY WITH SUBACROMIAL DECOMPRESSION AND MINI OPEN ROTATOR CUFF REPAIR, AND BICEPS TENDODESIS;  Surgeon: Augustin Schooling, MD;  Location: [facility];  Service: Orthopedics;  Laterality: Right;    FAMILY HISTORY: Family History  Problem Relation Age of Onset  . Arthritis Mother   . Hyperlipidemia Mother   . Skin cancer Father   . Depression Father   . Parkinson's disease Father     SOCIAL HISTORY:  History   Social History  . Marital Status: Married  Spouse Name: N/A  . Number of Children: 2  . Years of Education: Masters   Occupational History  . Management/Librarian    Social History Main Topics  . Smoking status: Never Smoker   . Smokeless tobacco: Never Used  . Alcohol Use: Yes     Comment: rarely  . Drug Use: No  . Sexual Activity: Not on file   Other Topics Concern  . Not on file   Social History Narrative   Lives at home with her husband.   Right-handed.   6-8 cups caffeine per day.     PHYSICAL EXAM   Filed Vitals:   08/29/14 0810  BP: 116/75  Pulse: 81  Height: 5'  4.5" (1.638 m)  Weight: 139 lb (63.05 kg)    Not recorded      Body mass index is 23.5 kg/(m^2).  PHYSICAL EXAMNIATION:  Gen: NAD, conversant, well nourised, obese, well groomed                     Cardiovascular: Regular rate rhythm, no peripheral edema, warm, nontender. Eyes: Conjunctivae clear without exudates or hemorrhage Neck: Supple, no carotid bruise. Pulmonary: Clear to auscultation bilaterally   NEUROLOGICAL EXAM:  MENTAL STATUS: Speech:    Speech is normal; fluent and spontaneous with normal comprehension.  Cognition:    The patient is oriented to person, place, and time;     recent and remote memory intact;     language fluent;     normal attention, concentration,     fund of knowledge.  CRANIAL NERVES: CN II: Visual fields are full to confrontation. Fundoscopic exam is normal with sharp discs and no vascular changes. Venous pulsations are present bilaterally. Pupils are 4 mm and briskly reactive to light.  CN III, IV, VI: extraocular movement are normal. No ptosis. CN V: Facial sensation is intact to pinprick in all 3 divisions bilaterally. Corneal responses are intact.  CN VII: Face is symmetric with normal eye closure and smile. CN VIII: Hearing is normal to rubbing fingers CN IX, X: Palate elevates symmetrically. Phonation is normal. Small oropharyngeal  CN XI: Head turning and shoulder shrug are intact CN XII: Tongue is midline with normal movements and no atrophy.  MOTOR: There is no pronator drift of out-stretched arms. Muscle bulk and tone are normal. Muscle strength is normal.  REFLEXES: Reflexes are 2+ and symmetric at the biceps, triceps, knees, and ankles. Plantar responses are flexor.  SENSORY: Light touch, pinprick, position sense, and vibration sense are intact in fingers and toes.  COORDINATION: Rapid alternating movements and fine finger movements are intact. There is no dysmetria on finger-to-nose and heel-knee-shin. There are no abnormal  or extraneous movements.   GAIT/STANCE: Posture is normal. Gait is steady with normal steps, base, arm swing, and turning. Heel and toe walking are normal. Tandem gait is normal.  Romberg is absent.   DIAGNOSTIC DATA (LABS, IMAGING, TESTING) - I reviewed patient records, labs, notes, testing and imaging myself where available.  Lab Results  Component Value Date   WBC 6.2 12/01/2013   HGB 15.0 12/01/2013   HCT 44.7 12/01/2013   MCV 89.6 12/01/2013   PLT 294 12/01/2013      Component Value Date/Time   NA 142 04/23/2007 1431   K 4.2 04/23/2007 1431   CL 104 04/23/2007 1431   CO2 32 04/23/2007 1431   GLUCOSE 118* 04/23/2007 1431   BUN 12 04/23/2007 1431   CREATININE 0.6 04/23/2007 1431  CALCIUM 9.3 04/23/2007 1431   PROT 7.2 04/23/2007 1431   ALBUMIN 4.2 04/23/2007 1431   AST 17 04/23/2007 1431   ALT 15 04/23/2007 1431   ALKPHOS 46 04/23/2007 1431   BILITOT 0.6 04/23/2007 1431   GFRNONAA 112 04/23/2007 1431   GFRAA 136 04/23/2007 1431    ASSESSMENT AND PLAN  Guynell JOSETTA WIGAL is a 59 y.o. female presented with intermittent numbness involving right first 3 fingers,  right tongue, right foot,  1, differentiation diagnosis includes left hemisphere pathology, versus Right carpal tunnel syndromes 2. MRI of brain without contrast 3. EMG nerve conduction study 4. She also complained of frequent awakening at nighttime, dry mouth, excessive daytime sleepiness, fatigue, she has small oropharyngeal, at high risk for obstructive sleep apnea, possible refer her sleep study   Marcial Pacas, M.D. Ph.D.  [facility] [address], [place], [place] [address] Ph: [phone] Fax: [phone]

## 2014-09-08 DIAGNOSIS — R202 Paresthesia of skin: Secondary | ICD-10-CM | POA: Diagnosis not present

## 2014-09-13 ENCOUNTER — Other Ambulatory Visit: Payer: Self-pay | Admitting: Diagnostic Neuroimaging

## 2014-09-13 DIAGNOSIS — R202 Paresthesia of skin: Secondary | ICD-10-CM

## 2014-09-14 ENCOUNTER — Telehealth: Payer: Self-pay | Admitting: Neurology

## 2014-09-14 NOTE — Telephone Encounter (Signed)
Patient aware of results and will keep her follow up appt.

## 2014-09-14 NOTE — Telephone Encounter (Signed)
[name]:  Please let patient know, MRI brain showed no significant abnormalities on MRI, will review detail at her next visit  Equivocal MRI brain (without) demonstrating: 1. Minimal periventricular and subcortical foci of non-specific gliosis. 2. No acute findings.

## 2014-09-18 ENCOUNTER — Ambulatory Visit (INDEPENDENT_AMBULATORY_CARE_PROVIDER_SITE_OTHER): Payer: 59 | Admitting: Neurology

## 2014-09-18 DIAGNOSIS — R202 Paresthesia of skin: Secondary | ICD-10-CM

## 2014-09-18 DIAGNOSIS — G4710 Hypersomnia, unspecified: Secondary | ICD-10-CM

## 2014-09-18 NOTE — Progress Notes (Addendum)
PATIENT: [name] DOB: Jul 30, 1955  No chief complaint on file.   HISTORICAL  [name] JASILYN HOLDERMAN is a 59 years old right-handed female, seen referred by her primary care physician Dr. Darcus Austin, for intermittent numbness tingling involving different areas, right worse than left.  She had a history of scoliosis, with chronic back pain, had right rotator cuff surgery April 2015, she tends to sleep holding her right wrist flexion  Since April 2016, she reported frequent episodes of vocal up in the middle of the night, felt numbness in her right hand, initially only involving right thumb, later she noticed numbness involving right first 3 fingers, getting better by change position, wrist splint has been really helpful  A month later, she also noticed intermittent numbness involving her right toe, sometimes extending into the ball of her right foot, she had a few episode of woke up notice right tongue numbness, short lasting,  No visual changes, she denies gait difficulty, she also complains of frequent awakening at night time,dry mouth, fatigue, sleepiness during the day  UPDATE September 18 2014: She came in for electrodiagnostic study today, which is normal. There was no evidence of right upper extremity neuropathy, or right cervical radiculopathy.  We also reviewed MRI of the brain, nonspecific subcortical gliosis, no significant abnormality,  She continue complains of excessive daytime sleepiness, ESS score is 14, FSS score is 23 today, she has no significant snoring,  but complains of choking episode if she lying on her back  REVIEW OF SYSTEMS: Full 14 system review of systems performed and notable only for as above  ALLERGIES: No Known Allergies  HOME MEDICATIONS: Current Outpatient Prescriptions  Medication Sig Dispense Refill  . aspirin 81 MG chewable tablet Chew 81 mg by mouth daily.    . calcium carbonate (TUMS - DOSED IN MG ELEMENTAL CALCIUM) 500 MG chewable tablet Chew 1  tablet by mouth daily as needed for indigestion or heartburn.    . Cholecalciferol (VITAMIN D) 2000 UNITS CAPS Take 2,000 Units by mouth daily.    . cycloSPORINE (RESTASIS) 0.05 % ophthalmic emulsion Place 1 drop into both eyes 2 (two) times daily.    . Omega-3 Fatty Acids (FISH OIL PO) Take 1 capsule by mouth daily.    Marland Kitchen VAGIFEM 10 MCG TABS vaginal tablet INSERT 1 TABLET VAGINALLY TWICE A WEEK  3     PAST MEDICAL HISTORY: Past Medical History  Diagnosis Date  . Anxiety     very high  . GERD (gastroesophageal reflux disease)   . Heart murmur     Pt saw Cardiologist when she was in her 68s, not since.  . Numbness and tingling     PAST SURGICAL HISTORY: Past Surgical History  Procedure Laterality Date  . Cesarean section    . Tonsillectomy    . Shoulder arthroscopy with subacromial decompression and open rotator c Right 12/09/2013    Procedure: RIGHT SHOULDER ARTHROSCOPY WITH SUBACROMIAL DECOMPRESSION AND MINI OPEN ROTATOR CUFF REPAIR, AND BICEPS TENDODESIS;  Surgeon: Augustin Schooling, MD;  Location: [place];  Service: Orthopedics;  Laterality: Right;    FAMILY HISTORY: Family History  Problem Relation Age of Onset  . Arthritis Mother   . Hyperlipidemia Mother   . Skin cancer Father   . Depression Father   . Parkinson's disease Father     SOCIAL HISTORY:  History   Social History  . Marital Status: Married    Spouse Name: N/A  . Number of Children: 2  .  Years of Education: Masters   Occupational History  . Management/Librarian    Social History Main Topics  . Smoking status: Never Smoker   . Smokeless tobacco: Never Used  . Alcohol Use: Yes     Comment: rarely  . Drug Use: No  . Sexual Activity: Not on file   Other Topics Concern  . Not on file   Social History Narrative   Lives at home with her husband.   Right-handed.   6-8 cups caffeine per day.     PHYSICAL EXAM   There were no vitals filed for this visit.  Not recorded      There is no weight  on file to calculate BMI.  PHYSICAL EXAMNIATION:  Gen: NAD, conversant, well nourised, obese, well groomed                     Cardiovascular: Regular rate rhythm, no peripheral edema, warm, nontender. Eyes: Conjunctivae clear without exudates or hemorrhage Neck: Supple, no carotid bruise. Pulmonary: Clear to auscultation bilaterally   NEUROLOGICAL EXAM:  MENTAL STATUS: Speech:    Speech is normal; fluent and spontaneous with normal comprehension.  Cognition:    The patient is oriented to person, place, and time;     recent and remote memory intact;     language fluent;     normal attention, concentration,     fund of knowledge.  CRANIAL NERVES: CN II: Visual fields are full to confrontation.  pupils were equal round reactive to light  CN III, IV, VI: extraocular movement are normal. No ptosis. CN V: Facial sensation is intact to pinprick in all 3 divisions bilaterally. Corneal responses are intact.  CN VII: Face is symmetric with normal eye closure and smile. CN VIII: Hearing is normal to rubbing fingers CN IX, X: Palate elevates symmetrically. Phonation is normal. Small oropharyngeal  CN XI: Head turning and shoulder shrug are intact CN XII: Tongue is midline with normal movements and no atrophy.  MOTOR: There is no pronator drift of out-stretched arms. Muscle bulk and tone are normal. Muscle strength is normal.  REFLEXES: Reflexes are 2+ and symmetric at the biceps, triceps, knees, and ankles. Plantar responses are flexor.  SENSORY: Light touch, pinprick, position sense, and vibration sense are intact in fingers and toes.  COORDINATION: Rapid alternating movements and fine finger movements are intact. There is no dysmetria on finger-to-nose and heel-knee-shin. There are no abnormal or extraneous movements.   GAIT/STANCE: Posture is normal. Gait is steady with normal steps, base, arm swing, and turning.  Romberg is absent.   DIAGNOSTIC DATA (LABS, IMAGING,  TESTING) - I reviewed patient records, labs, notes, testing and imaging myself where available.  Lab Results  Component Value Date   WBC 6.2 12/01/2013   HGB 15.0 12/01/2013   HCT 44.7 12/01/2013   MCV 89.6 12/01/2013   PLT 294 12/01/2013      Component Value Date/Time   NA 142 04/23/2007 1431   K 4.2 04/23/2007 1431   CL 104 04/23/2007 1431   CO2 32 04/23/2007 1431   GLUCOSE 118* 04/23/2007 1431   BUN 12 04/23/2007 1431   CREATININE 0.6 04/23/2007 1431   CALCIUM 9.3 04/23/2007 1431   PROT 7.2 04/23/2007 1431   ALBUMIN 4.2 04/23/2007 1431   AST 17 04/23/2007 1431   ALT 15 04/23/2007 1431   ALKPHOS 46 04/23/2007 1431   BILITOT 0.6 04/23/2007 1431   GFRNONAA 112 04/23/2007 1431   GFRAA 136 04/23/2007 1431  ASSESSMENT AND PLAN  Anmarie GENEVA BARRERO is a 59 y.o. female presented with intermittent numbness involving right first 3 fingers,  right tongue, right foot,  Extensive evaluations failed to demonstrate etiology, in specific, there was no evidence of central nervous system etiology, no evidence of right cervical radiculopathy, or right upper extremity neuropathy   Potential differentiation diagnosis includes transient compression of median nerve, ulnar nerve, superficial peroneal nerve due to her posturing She also complains of excessive daytime sleepiness, today's ESS score is 14, she has narrow oropharyngeal, suggestive of obstructive sleep apnea, she wants to hold off sleep study at this point, I have advised her moderate exercise, sleep on her side  Continue to observe her symptoms, return to clinic for new issues.   Marcial Pacas, M.D. Ph.D.  [facility] [address], [place], [place] [address] Ph: [phone] Fax: [phone]

## 2014-09-18 NOTE — Procedures (Signed)
   NCS (NERVE CONDUCTION STUDY) WITH EMG (ELECTROMYOGRAPHY) REPORT   STUDY DATE: September 18 2014  PATIENT NAME: [name] DOB: 1955/07/02 MRN: [id number]    TECHNOLOGIST: Laretta Alstrom ELECTROMYOGRAPHER: Marcial Pacas M.D.  CLINICAL INFORMATION: 59 year old female, with intermittent bilateral upper, and lower extremity paresthesia.  FINDINGS: NERVE CONDUCTION STUDY: Right peroneal sensory response was normal. Right peroneal to EDB, tibial motor responses were normal.  Right median, ulnar sensory and motor responses were normal.  NEEDLE ELECTROMYOGRAPHY: Selected needle examination was performed at right upper extremity muscles, and right cervical paraspinal muscles.  Needle examination of right abductor pollicis brevis, pronator teres, biceps, triceps, deltoid was normal.  There was no spontaneous activity at right cervical paraspinal muscles, right C5, C6  IMPRESSION:  This is a normal study. There was no electrodiagnostic evidence of right upper extremity neuropathy, in specific, there was no evidence of right median neuropathy. There was no evidence of right cervical radiculopathy.    INTERPRETING PHYSICIAN:   Marcial Pacas M.D. Ph.D. [facility] [address] [place], [place] [address] [phone]

## 2014-09-18 NOTE — Addendum Note (Signed)
Addended by: Marcial Pacas on: 09/18/2014 06:32 PM   Modules accepted: Level of Service

## 2014-09-21 ENCOUNTER — Ambulatory Visit
Admission: RE | Admit: 2014-09-21 | Discharge: 2014-09-21 | Disposition: A | Discharge status: Home / Self Care | Payer: 59 | Source: Ambulatory Visit | Attending: Family Medicine | Admitting: Family Medicine

## 2014-09-21 ENCOUNTER — Other Ambulatory Visit: Payer: Self-pay | Admitting: Family Medicine

## 2014-09-21 DIAGNOSIS — S99922A Unspecified injury of left foot, initial encounter: Secondary | ICD-10-CM

## 2015-04-16 ENCOUNTER — Other Ambulatory Visit: Payer: Self-pay | Admitting: Family Medicine

## 2015-04-16 ENCOUNTER — Ambulatory Visit
Admission: RE | Admit: 2015-04-16 | Discharge: 2015-04-16 | Disposition: A | Discharge status: Home / Self Care | Payer: 59 | Source: Ambulatory Visit | Attending: Family Medicine | Admitting: Family Medicine

## 2015-04-16 DIAGNOSIS — R059 Cough, unspecified: Secondary | ICD-10-CM

## 2015-04-16 DIAGNOSIS — R05 Cough: Secondary | ICD-10-CM

## 2015-05-17 ENCOUNTER — Encounter: Payer: Self-pay | Admitting: Internal Medicine

## 2016-03-14 DIAGNOSIS — I358 Other nonrheumatic aortic valve disorders: Secondary | ICD-10-CM | POA: Diagnosis not present

## 2016-03-20 DIAGNOSIS — I350 Nonrheumatic aortic (valve) stenosis: Secondary | ICD-10-CM | POA: Diagnosis not present

## 2016-04-09 DIAGNOSIS — I350 Nonrheumatic aortic (valve) stenosis: Secondary | ICD-10-CM | POA: Diagnosis not present

## 2016-04-11 DIAGNOSIS — M545 Low back pain: Secondary | ICD-10-CM | POA: Diagnosis not present

## 2016-04-18 DIAGNOSIS — I350 Nonrheumatic aortic (valve) stenosis: Secondary | ICD-10-CM | POA: Diagnosis not present

## 2016-04-21 DIAGNOSIS — M545 Low back pain: Secondary | ICD-10-CM | POA: Diagnosis not present

## 2016-04-22 DIAGNOSIS — R9439 Abnormal result of other cardiovascular function study: Secondary | ICD-10-CM | POA: Diagnosis not present

## 2016-04-22 DIAGNOSIS — I2510 Atherosclerotic heart disease of native coronary artery without angina pectoris: Secondary | ICD-10-CM | POA: Diagnosis not present

## 2016-04-22 DIAGNOSIS — I350 Nonrheumatic aortic (valve) stenosis: Secondary | ICD-10-CM | POA: Diagnosis not present

## 2016-04-24 DIAGNOSIS — S22080A Wedge compression fracture of T11-T12 vertebra, initial encounter for closed fracture: Secondary | ICD-10-CM | POA: Diagnosis not present

## 2016-04-24 DIAGNOSIS — M545 Low back pain: Secondary | ICD-10-CM | POA: Diagnosis not present

## 2016-04-30 DIAGNOSIS — Z0289 Encounter for other administrative examinations: Secondary | ICD-10-CM

## 2016-05-01 DIAGNOSIS — Z0000 Encounter for general adult medical examination without abnormal findings: Secondary | ICD-10-CM | POA: Diagnosis not present

## 2016-05-01 DIAGNOSIS — Z13220 Encounter for screening for lipoid disorders: Secondary | ICD-10-CM | POA: Diagnosis not present

## 2016-05-01 DIAGNOSIS — Z1231 Encounter for screening mammogram for malignant neoplasm of breast: Secondary | ICD-10-CM | POA: Diagnosis not present

## 2016-05-08 DIAGNOSIS — S22080D Wedge compression fracture of T11-T12 vertebra, subsequent encounter for fracture with routine healing: Secondary | ICD-10-CM | POA: Diagnosis not present

## 2016-05-30 DIAGNOSIS — S22080D Wedge compression fracture of T11-T12 vertebra, subsequent encounter for fracture with routine healing: Secondary | ICD-10-CM | POA: Diagnosis not present

## 2016-06-05 ENCOUNTER — Encounter: Payer: Self-pay | Admitting: Gastroenterology

## 2016-06-09 DIAGNOSIS — M8588 Other specified disorders of bone density and structure, other site: Secondary | ICD-10-CM | POA: Diagnosis not present

## 2016-06-09 DIAGNOSIS — M810 Age-related osteoporosis without current pathological fracture: Secondary | ICD-10-CM | POA: Diagnosis not present

## 2016-06-09 LAB — HM DEXA SCAN

## 2016-06-10 DIAGNOSIS — S22080D Wedge compression fracture of T11-T12 vertebra, subsequent encounter for fracture with routine healing: Secondary | ICD-10-CM | POA: Diagnosis not present

## 2016-06-10 DIAGNOSIS — M792 Neuralgia and neuritis, unspecified: Secondary | ICD-10-CM | POA: Diagnosis not present

## 2016-06-12 ENCOUNTER — Encounter: Payer: Self-pay | Admitting: Neurology

## 2016-06-12 ENCOUNTER — Encounter (INDEPENDENT_AMBULATORY_CARE_PROVIDER_SITE_OTHER): Payer: Self-pay

## 2016-06-12 ENCOUNTER — Ambulatory Visit (INDEPENDENT_AMBULATORY_CARE_PROVIDER_SITE_OTHER): Payer: Commercial Managed Care - HMO | Admitting: Neurology

## 2016-06-12 DIAGNOSIS — R202 Paresthesia of skin: Secondary | ICD-10-CM | POA: Insufficient documentation

## 2016-06-12 NOTE — Progress Notes (Signed)
PATIENT: [name] DOB: [birth year]  Chief Complaint  Patient presents with  . Follow-up    shock like sensations in right leg, painful, doesn't happen all the time  . PCP    Dr. Darcus Austin    HISTORICAL  [name] is a 61 years old right-handed female, seen referred by her primary care physician Dr. Darcus Austin, for intermittent numbness tingling involving different areas, right worse than left.  She had a history of scoliosis, with chronic back pain, had right rotator cuff surgery in April 2015, she tends to sleep holding her right wrist flexion  Since April 2016, she reported frequent episodes of waking up in the middle of the night, felt numbness in her right hand, initially only involving right thumb, later she noticed numbness involving right first 3 fingers, getting better by change position, wrist splint has been really helpful  A month later, she also noticed intermittent numbness involving her right toe, sometimes extending into the ball of her right foot, she had a few episode of woke up notice right tongue numbness, short lasting,  No visual changes, she denies gait difficulty, she also complains of frequent awakening at night time,dry mouth, fatigue, sleepiness during the day  UPDATE June 12 2016; She was initially evaluated in 2016 for intermittent numbness involving right first 3 fingers,  right tongue, right foot,  Extensive evaluations failed to demonstrate etiology, in specific, there was no evidence of central nervous system etiology, no evidence of right cervical radiculopathy, or right upper extremity neuropathy   Her symptoms has much improved after she adjusting her hand position during sleep, she has mild low back pain, but since 2017, she was bothered by this intermittent set onset [place] of shooting sensation at right lateral leg, felt like somebody was tasering her, each episode only lasts a few seconds, there was no persistent weakness or sensory  loss,  Most severe episode was on April third, she had 4 episodes in one hour, each episode lasts about 15 seconds, severe sharp pain, in between episodes, she denies sensory loss, weakness, no low back pain, no gait abnormality.  MRI of the brain without contrast on September 13 2014 that was normal,  REVIEW OF SYSTEMS: Full 14 system review of systems performed and notable only for numbness, back pain  ALLERGIES: No Known Allergies  HOME MEDICATIONS: Current Outpatient Prescriptions  Medication Sig Dispense Refill  . aspirin 81 MG chewable tablet Chew 81 mg by mouth daily.    . calcium carbonate (TUMS - DOSED IN MG ELEMENTAL CALCIUM) 500 MG chewable tablet Chew 1 tablet by mouth daily as needed for indigestion or heartburn.    . Cholecalciferol (VITAMIN D) 2000 UNITS CAPS Take 2,000 Units by mouth daily.    . cycloSPORINE (RESTASIS) 0.05 % ophthalmic emulsion Place 1 drop into both eyes 2 (two) times daily.    . Omega-3 Fatty Acids (FISH OIL PO) Take 1 capsule by mouth daily.    Marland Kitchen VAGIFEM 10 MCG TABS vaginal tablet INSERT 1 TABLET VAGINALLY TWICE A WEEK  3     PAST MEDICAL HISTORY: Past Medical History:  Diagnosis Date  . Anxiety    very high  . GERD (gastroesophageal reflux disease)   . Heart murmur    Pt saw Cardiologist when she was in her 73s, not since.  . Numbness and tingling     PAST SURGICAL HISTORY: Past Surgical History:  Procedure Laterality Date  . CESAREAN SECTION    .  SHOULDER ARTHROSCOPY WITH SUBACROMIAL DECOMPRESSION AND OPEN ROTATOR C Right 12/09/2013   Procedure: RIGHT SHOULDER ARTHROSCOPY WITH SUBACROMIAL DECOMPRESSION AND MINI OPEN ROTATOR CUFF REPAIR, AND BICEPS TENDODESIS;  Surgeon: Augustin Schooling, MD;  Location: [place];  Service: Orthopedics;  Laterality: Right;  . TONSILLECTOMY      FAMILY HISTORY: Family History  Problem Relation Age of Onset  . Arthritis Mother   . Hyperlipidemia Mother   . Skin cancer Father   . Depression Father   .  Parkinson's disease Father     SOCIAL HISTORY:  Social History   Social History  . Marital status: Married    Spouse name: N/A  . Number of children: 2  . Years of education: Masters   Occupational History  . Management/Librarian    Social History Main Topics  . Smoking status: Never Smoker  . Smokeless tobacco: Never Used  . Alcohol use Yes     Comment: rarely  . Drug use: No  . Sexual activity: Not on file   Other Topics Concern  . Not on file   Social History Narrative   Lives at home with her husband.   Right-handed.   6-8 cups caffeine per day.     PHYSICAL EXAM   Vitals:   06/12/16 1301  BP: 132/81  Pulse: 93  Resp: 20  Weight: 132 lb (59.9 kg)  Height: 5\' 4"  (1.626 m)    Not recorded      Body mass index is 22.66 kg/m.  PHYSICAL EXAMNIATION:  Gen: NAD, conversant, well nourised, obese, well groomed                     Cardiovascular: Regular rate rhythm, no peripheral edema, warm, nontender. Eyes: Conjunctivae clear without exudates or hemorrhage Neck: Supple, no carotid bruise. Pulmonary: Clear to auscultation bilaterally   NEUROLOGICAL EXAM:  MENTAL STATUS: Speech:    Speech is normal; fluent and spontaneous with normal comprehension.  Cognition:    The patient is oriented to person, place, and time;     recent and remote memory intact;     language fluent;     normal attention, concentration,     fund of knowledge.  CRANIAL NERVES: CN II: Visual fields are full to confrontation. Fundoscopic exam is normal with sharp discs and no vascular changes. Venous pulsations are present bilaterally. Pupils are 4 mm and briskly reactive to light.  CN III, IV, VI: extraocular movement are normal. No ptosis. CN V: Facial sensation is intact to pinprick in all 3 divisions bilaterally. Corneal responses are intact.  CN VII: Face is symmetric with normal eye closure and smile. CN VIII: Hearing is normal to rubbing fingers CN IX, X: Palate elevates  symmetrically. Phonation is normal. Small oropharyngeal  CN XI: Head turning and shoulder shrug are intact CN XII: Tongue is midline with normal movements and no atrophy.  MOTOR: There is no pronator drift of out-stretched arms. Muscle bulk and tone are normal. Muscle strength is normal.  REFLEXES: Reflexes are 2+ and symmetric at the biceps, triceps, knees, and ankles. Plantar responses are flexor.  SENSORY: Light touch, pinprick, position sense, and vibration sense are intact in fingers and toes.  COORDINATION: Rapid alternating movements and fine finger movements are intact. There is no dysmetria on finger-to-nose and heel-knee-shin. There are no abnormal or extraneous movements.   GAIT/STANCE: Posture is normal. Gait is steady with normal steps, base, arm swing, and turning. Heel and toe walking are normal. Tandem  gait is normal.  Romberg is absent.   DIAGNOSTIC DATA (LABS, IMAGING, TESTING) - I reviewed patient records, labs, notes, testing and imaging myself where available.  Lab Results  Component Value Date   WBC 6.2 12/01/2013   HGB 15.0 12/01/2013   HCT 44.7 12/01/2013   MCV 89.6 12/01/2013   PLT 294 12/01/2013      Component Value Date/Time   NA 142 04/23/2007 1431   K 4.2 04/23/2007 1431   CL 104 04/23/2007 1431   CO2 32 04/23/2007 1431   GLUCOSE 118 (H) 04/23/2007 1431   BUN 12 04/23/2007 1431   CREATININE 0.6 04/23/2007 1431   CALCIUM 9.3 04/23/2007 1431   PROT 7.2 04/23/2007 1431   ALBUMIN 4.2 04/23/2007 1431   AST 17 04/23/2007 1431   ALT 15 04/23/2007 1431   ALKPHOS 46 04/23/2007 1431   BILITOT 0.6 04/23/2007 1431   GFRNONAA 112 04/23/2007 1431   GFRAA 136 04/23/2007 1431    ASSESSMENT AND PLAN  Claris EVANN KOELZER is a 61 y.o. female  Transient right lateral leg radiating pain  In the territory of right superficial peroneal nerve,  Differentiation diagnosis include right lateral knee compression,  I have advised her hyperextended her leg during  episode, avoiding compression of right lateral knee  Marcial Pacas, M.D. Ph.D.  [facility] [address], [place], [place] [address] Ph: [phone] Fax: [phone]

## 2016-06-25 DIAGNOSIS — M810 Age-related osteoporosis without current pathological fracture: Secondary | ICD-10-CM | POA: Diagnosis not present

## 2016-07-02 ENCOUNTER — Telehealth: Payer: Self-pay | Admitting: Neurology

## 2016-07-02 ENCOUNTER — Encounter: Payer: Self-pay | Admitting: Neurology

## 2016-07-02 DIAGNOSIS — M79604 Pain in right leg: Secondary | ICD-10-CM

## 2016-07-02 DIAGNOSIS — R202 Paresthesia of skin: Secondary | ICD-10-CM

## 2016-07-02 MED ORDER — GABAPENTIN 100 MG PO CAPS: 100.0000 mg | ORAL_CAPSULE | Freq: Three times a day (TID) | ORAL | 5 refills | Status: DC

## 2016-07-02 NOTE — Addendum Note (Signed)
Addended by: Desmond Lope on: 07/02/2016 05:47 PM   Modules accepted: Orders

## 2016-07-02 NOTE — Telephone Encounter (Signed)
Patient agreeable to try gabapentin.  She is still having significant pain despite taking great care to rest her leg.  She would like to proceed w/ MRI.  Order placed in Epic and she is aware to expect a call for scheduling.

## 2016-07-02 NOTE — Addendum Note (Signed)
Addended by: Desmond Lope on: 07/02/2016 05:43 PM   Modules accepted: Orders

## 2016-07-02 NOTE — Telephone Encounter (Signed)
Please call patient, I may call in gabapentin 100 mg 3 times a day for her, to see if the medicine can prevent her from having another episode, radiating neuropathic pain along right peroneal  nerve,  I may also offer MRI of right leg or [place] of right leg to rule out focal compression along her right peroneal nerve.  It all depends on how bothersome is her symptoms.  She should try to avoid excessive right knee flexion, pressure at right fibular head.

## 2016-07-04 ENCOUNTER — Telehealth: Payer: Self-pay | Admitting: Neurology

## 2016-07-04 ENCOUNTER — Other Ambulatory Visit: Payer: Self-pay | Admitting: Neurology

## 2016-07-04 MED ORDER — GABAPENTIN 300 MG PO CAPS: 300.0000 mg | ORAL_CAPSULE | Freq: Three times a day (TID) | ORAL | 5 refills | Status: DC

## 2016-07-04 NOTE — Telephone Encounter (Signed)
Patient called c/o increased pain in the peroneal nerve. Instricted her to increase gabapentin and called in prescription for 300mg  tid thanks

## 2016-07-07 NOTE — Telephone Encounter (Signed)
May check on her symptoms again in 2-3 days, other options are cymbalta, nortriptyline, Lyrica ect

## 2016-07-08 DIAGNOSIS — M5136 Other intervertebral disc degeneration, lumbar region: Secondary | ICD-10-CM | POA: Diagnosis not present

## 2016-07-08 DIAGNOSIS — S22080D Wedge compression fracture of T11-T12 vertebra, subsequent encounter for fracture with routine healing: Secondary | ICD-10-CM | POA: Diagnosis not present

## 2016-07-08 NOTE — Telephone Encounter (Signed)
Pt returned RN's call °

## 2016-07-08 NOTE — Telephone Encounter (Signed)
Left message for a return call

## 2016-07-08 NOTE — Telephone Encounter (Signed)
Spoke to patient - states for now gabapentin 300mg , TID is working well to control her symptoms.  She is concerned about her upcoming 10-day trip to [place].  Per vo by Dr. Krista Blue, she can take an extra doses of gabapentin, if needed but the patient is hesitant to do this while on her trip.  If symptoms worsen, she may try an additional dose at bedtime and Dr. Krista Blue has suggested bridging the medication with Aleve during the day to lessen the chance of side effects while on vacation.  She is agreeable to this plan.

## 2016-07-11 NOTE — Telephone Encounter (Signed)
Pt called said she severe pain a few nights ago and is wanting to know if she could increase 100mg  right before bedtime. Pt is aware clinic closes at noon today

## 2016-07-11 NOTE — Telephone Encounter (Addendum)
Spoke to patient and reviewed her gabapentin doses as discussed previously.  She verbalized understanding.

## 2016-07-23 ENCOUNTER — Ambulatory Visit
Admission: RE | Admit: 2016-07-23 | Discharge: 2016-07-23 | Disposition: A | Discharge status: Home / Self Care | Payer: 59 | Source: Ambulatory Visit | Attending: Neurology | Admitting: Neurology

## 2016-07-23 DIAGNOSIS — M79604 Pain in right leg: Secondary | ICD-10-CM

## 2016-07-23 DIAGNOSIS — M79661 Pain in right lower leg: Secondary | ICD-10-CM | POA: Diagnosis not present

## 2016-07-23 DIAGNOSIS — R202 Paresthesia of skin: Secondary | ICD-10-CM

## 2016-07-24 ENCOUNTER — Ambulatory Visit (INDEPENDENT_AMBULATORY_CARE_PROVIDER_SITE_OTHER): Payer: Commercial Managed Care - HMO | Admitting: Neurology

## 2016-07-24 ENCOUNTER — Encounter: Payer: Self-pay | Admitting: Neurology

## 2016-07-24 VITALS — BP 119/79 | HR 75 | Resp 14 | Ht 64.0 in | Wt 134.0 lb

## 2016-07-24 DIAGNOSIS — M79604 Pain in right leg: Secondary | ICD-10-CM

## 2016-07-24 DIAGNOSIS — R202 Paresthesia of skin: Secondary | ICD-10-CM | POA: Diagnosis not present

## 2016-07-24 DIAGNOSIS — M7989 Other specified soft tissue disorders: Secondary | ICD-10-CM | POA: Diagnosis not present

## 2016-07-24 MED ORDER — GABAPENTIN 300 MG PO CAPS
300.0000 mg | ORAL_CAPSULE | Freq: Three times a day (TID) | ORAL | 11 refills | Status: DC
Start: 2016-07-24 — End: 2017-01-26

## 2016-07-24 NOTE — Progress Notes (Signed)
PATIENT: [name] DOB: 1955/12/19  Chief Complaint  Patient presents with  . Follow-up    Rm 4. Patient states that she has had an increase in R leg pain. Reports that the Gabapentin has helped.     HISTORICAL  [name] is a 61 years old right-handed female, seen referred by her primary care physician Dr. Darcus Austin, for intermittent numbness tingling involving different areas, right worse than left.  She had a history of scoliosis, with chronic back pain, had right rotator cuff surgery in April 2015, she tends to sleep holding her right wrist flexion  Since April 2016, she reported frequent episodes of waking up in the middle of the night, felt numbness in her right hand, initially only involving right thumb, later she noticed numbness involving right first 3 fingers, getting better by change position, wrist splint has been really helpful  A month later, she also noticed intermittent numbness involving her right toe, sometimes extending into the ball of her right foot, she had a few episode of woke up notice right tongue numbness, short lasting,  No visual changes, she denies gait difficulty, she also complains of frequent awakening at night time,dry mouth, fatigue, sleepiness during the day  UPDATE June 12 2016; She was initially evaluated in 2016 for intermittent numbness involving right first 3 fingers,  right tongue, right foot,  Extensive evaluations failed to demonstrate etiology, in specific, there was no evidence of central nervous system etiology, no evidence of right cervical radiculopathy, or right upper extremity neuropathy   Her symptoms has much improved after she adjusting her hand position during sleep, she has mild low back pain, but since 2017, she was bothered by this intermittent set onset jolt of shooting sensation at right lateral leg, felt like somebody was tasering her, each episode only lasts a few seconds, there was no persistent weakness or  sensory loss,  Most severe episode was on April third, she had 4 episodes in one hour, each episode lasts about 15 seconds, severe sharp pain, in between episodes, she denies sensory loss, weakness, no low back pain, no gait abnormality.  MRI of the brain without contrast on September 13 2014 that was normal,  UPDATE Jul 24 2016: She was able to enjoy her [place] trip, taking gabapentin 300 mg 3 times a day which has helped her symptoms, she continue compliance of recurrent unpredictable set onset severe short pain at her right lateral leg, short lasting, no limitation in her functional afterwards, but during the painful spells," as if I have been tortured", which has caused significant psychological fear in her.  I have personally reviewed MRI of right lower extremity there was no significant abnormality noted.  REVIEW OF SYSTEMS: Full 14 system review of systems performed and notable only for as above  ALLERGIES: No Known Allergies  HOME MEDICATIONS: Current Outpatient Prescriptions  Medication Sig Dispense Refill  . aspirin 81 MG chewable tablet Chew 81 mg by mouth daily.    . calcium carbonate (TUMS - DOSED IN MG ELEMENTAL CALCIUM) 500 MG chewable tablet Chew 1 tablet by mouth daily as needed for indigestion or heartburn.    . Cholecalciferol (VITAMIN D) 2000 UNITS CAPS Take 2,000 Units by mouth daily.    . cycloSPORINE (RESTASIS) 0.05 % ophthalmic emulsion Place 1 drop into both eyes 2 (two) times daily.    . Omega-3 Fatty Acids (FISH OIL PO) Take 1 capsule by mouth daily.    Marland Kitchen VAGIFEM 10 MCG  TABS vaginal tablet INSERT 1 TABLET VAGINALLY TWICE A WEEK  3     PAST MEDICAL HISTORY: Past Medical History:  Diagnosis Date  . Anxiety    very high  . GERD (gastroesophageal reflux disease)   . Heart murmur    Pt saw Cardiologist when she was in her 34s, not since.  . Numbness and tingling     PAST SURGICAL HISTORY: Past Surgical History:  Procedure Laterality Date  . CESAREAN SECTION     . SHOULDER ARTHROSCOPY WITH SUBACROMIAL DECOMPRESSION AND OPEN ROTATOR C Right 12/09/2013   Procedure: RIGHT SHOULDER ARTHROSCOPY WITH SUBACROMIAL DECOMPRESSION AND MINI OPEN ROTATOR CUFF REPAIR, AND BICEPS TENDODESIS;  Surgeon: Augustin Schooling, MD;  Location: [facility];  Service: Orthopedics;  Laterality: Right;  . TONSILLECTOMY      FAMILY HISTORY: Family History  Problem Relation Age of Onset  . Arthritis Mother   . Hyperlipidemia Mother   . Skin cancer Father   . Depression Father   . Parkinson's disease Father     SOCIAL HISTORY:  Social History   Social History  . Marital status: Married    Spouse name: N/A  . Number of children: 2  . Years of education: Masters   Occupational History  . Management/Librarian    Social History Main Topics  . Smoking status: Never Smoker  . Smokeless tobacco: Never Used  . Alcohol use Yes     Comment: rarely  . Drug use: No  . Sexual activity: Not on file   Other Topics Concern  . Not on file   Social History Narrative   Lives at home with her husband.   Right-handed.   6-8 cups caffeine per day.     PHYSICAL EXAM   Vitals:   07/24/16 0716  BP: 119/79  Pulse: 75  Resp: 14  Weight: 134 lb (60.8 kg)  Height: 5\' 4"  (1.626 m)    Not recorded      Body mass index is 23 kg/m.  PHYSICAL EXAMNIATION:  Gen: NAD, conversant, well nourised, obese, well groomed                     Cardiovascular: Regular rate rhythm, no peripheral edema, warm, nontender. Eyes: Conjunctivae clear without exudates or hemorrhage Neck: Supple, no carotid bruise. Pulmonary: Clear to auscultation bilaterally   NEUROLOGICAL EXAM:  MENTAL STATUS: Speech:    Speech is normal; fluent and spontaneous with normal comprehension.  Cognition:    The patient is oriented to person, place, and time;     recent and remote memory intact;     language fluent;     normal attention, concentration,     fund of knowledge.  CRANIAL NERVES: CN II:  Visual fields are full to confrontation. Fundoscopic exam is normal with sharp discs and no vascular changes. Venous pulsations are present bilaterally. Pupils are 4 mm and briskly reactive to light.  CN III, IV, VI: extraocular movement are normal. No ptosis. CN V: Facial sensation is intact to pinprick in all 3 divisions bilaterally. Corneal responses are intact.  CN VII: Face is symmetric with normal eye closure and smile. CN VIII: Hearing is normal to rubbing fingers CN IX, X: Palate elevates symmetrically. Phonation is normal. Small oropharyngeal  CN XI: Head turning and shoulder shrug are intact CN XII: Tongue is midline with normal movements and no atrophy.  MOTOR: There is no pronator drift of out-stretched arms. Muscle bulk and tone are normal. Muscle strength is  normal.  REFLEXES: Reflexes are 2+ and symmetric at the biceps, triceps, knees, and ankles. Plantar responses are flexor.  SENSORY: Light touch, pinprick, position sense, and vibration sense are intact in fingers and toes.  COORDINATION: Rapid alternating movements and fine finger movements are intact. There is no dysmetria on finger-to-nose and heel-knee-shin. There are no abnormal or extraneous movements.   GAIT/STANCE: Posture is normal. Gait is steady with normal steps, base, arm swing, and turning. Heel and toe walking are normal. Tandem gait is normal.  Romberg is absent.   DIAGNOSTIC DATA (LABS, IMAGING, TESTING) - I reviewed patient records, labs, notes, testing and imaging myself where available.  Lab Results  Component Value Date   WBC 6.2 12/01/2013   HGB 15.0 12/01/2013   HCT 44.7 12/01/2013   MCV 89.6 12/01/2013   PLT 294 12/01/2013      Component Value Date/Time   NA 142 04/23/2007 1431   K 4.2 04/23/2007 1431   CL 104 04/23/2007 1431   CO2 32 04/23/2007 1431   GLUCOSE 118 (H) 04/23/2007 1431   BUN 12 04/23/2007 1431   CREATININE 0.6 04/23/2007 1431   CALCIUM 9.3 04/23/2007 1431   PROT  7.2 04/23/2007 1431   ALBUMIN 4.2 04/23/2007 1431   AST 17 04/23/2007 1431   ALT 15 04/23/2007 1431   ALKPHOS 46 04/23/2007 1431   BILITOT 0.6 04/23/2007 1431   GFRNONAA 112 04/23/2007 1431   GFRAA 136 04/23/2007 1431    ASSESSMENT AND PLAN  [name] is a 61 y.o. female  Transient right lateral leg radiating pain  In the territory of right superficial peroneal nerve,  She has increased her right lower extremity swelling, proceed with Doppler study of the right lower extremity to rule out DVT  We have personally reviewed MRI of right lower extremity there was no significant asymmetry or abnormality noted.  Continue gabapentin 300 mg 3 times a day  May even consider Cymbalta 60 mg daily  Marcial Pacas, M.D. Ph.D.  [facility] [address] [place], [place] [address] Ph: [phone] Fax: [phone]

## 2016-07-25 LAB — ANA W/REFLEX: ANA: NEGATIVE

## 2016-07-25 LAB — C-REACTIVE PROTEIN: CRP: 1.5 mg/L (ref 0.0–4.9)

## 2016-07-25 LAB — SEDIMENTATION RATE: SED RATE: 2 mm/h (ref 0–40)

## 2016-07-25 LAB — VITAMIN B12: VITAMIN B 12: 799 pg/mL (ref 232–1245)

## 2016-07-29 DIAGNOSIS — A084 Viral intestinal infection, unspecified: Secondary | ICD-10-CM | POA: Diagnosis not present

## 2016-08-06 DIAGNOSIS — H401122 Primary open-angle glaucoma, left eye, moderate stage: Secondary | ICD-10-CM | POA: Diagnosis not present

## 2016-08-06 DIAGNOSIS — H04123 Dry eye syndrome of bilateral lacrimal glands: Secondary | ICD-10-CM | POA: Diagnosis not present

## 2016-08-06 DIAGNOSIS — H40011 Open angle with borderline findings, low risk, right eye: Secondary | ICD-10-CM | POA: Diagnosis not present

## 2016-09-11 DIAGNOSIS — J0100 Acute maxillary sinusitis, unspecified: Secondary | ICD-10-CM | POA: Diagnosis not present

## 2016-09-25 DIAGNOSIS — R05 Cough: Secondary | ICD-10-CM | POA: Diagnosis not present

## 2016-09-26 ENCOUNTER — Emergency Department (HOSPITAL_COMMUNITY): Payer: 59

## 2016-09-26 ENCOUNTER — Emergency Department (HOSPITAL_COMMUNITY)
Admission: EM | Admit: 2016-09-26 | Discharge: 2016-09-26 | Disposition: A | Discharge status: Home / Self Care | Payer: 59 | Attending: Emergency Medicine | Admitting: Emergency Medicine

## 2016-09-26 DIAGNOSIS — R42 Dizziness and giddiness: Secondary | ICD-10-CM

## 2016-09-26 DIAGNOSIS — R112 Nausea with vomiting, unspecified: Secondary | ICD-10-CM | POA: Diagnosis not present

## 2016-09-26 DIAGNOSIS — Z79899 Other long term (current) drug therapy: Secondary | ICD-10-CM | POA: Diagnosis not present

## 2016-09-26 DIAGNOSIS — Z7982 Long term (current) use of aspirin: Secondary | ICD-10-CM | POA: Diagnosis not present

## 2016-09-26 LAB — LIPASE, BLOOD: Lipase: 21 U/L (ref 11–51)

## 2016-09-26 LAB — COMPREHENSIVE METABOLIC PANEL
ALBUMIN: 4.2 g/dL (ref 3.5–5.0)
ALK PHOS: 50 U/L (ref 38–126)
ALT: 17 U/L (ref 14–54)
AST: 21 U/L (ref 15–41)
Anion gap: 7 (ref 5–15)
BUN: 12 mg/dL (ref 6–20)
CALCIUM: 8.4 mg/dL — AB (ref 8.9–10.3)
CO2: 29 mmol/L (ref 22–32)
CREATININE: 0.6 mg/dL (ref 0.44–1.00)
Chloride: 104 mmol/L (ref 101–111)
GFR calc Af Amer: >60 mL/min (ref >60)
GFR calc non Af Amer: >60 mL/min (ref >60)
GLUCOSE: 127 mg/dL — AB (ref 65–99)
Potassium: 3.6 mmol/L (ref 3.5–5.1)
SODIUM: 140 mmol/L (ref 135–145)
Total Bilirubin: 0.3 mg/dL (ref 0.3–1.2)
Total Protein: 6.9 g/dL (ref 6.5–8.1)

## 2016-09-26 LAB — CBC
HCT: 43.4 % (ref 36.0–46.0)
HEMOGLOBIN: 14.6 g/dL (ref 12.0–15.0)
MCH: 29.6 pg (ref 26.0–34.0)
MCHC: 33.6 g/dL (ref 30.0–36.0)
MCV: 88 fL (ref 78.0–100.0)
PLATELETS: 277 10*3/uL (ref 150–400)
RBC: 4.93 MIL/uL (ref 3.87–5.11)
RDW: 12.8 % (ref 11.5–15.5)
WBC: 9.1 10*3/uL (ref 4.0–10.5)

## 2016-09-26 LAB — URINALYSIS, ROUTINE W REFLEX MICROSCOPIC
BILIRUBIN URINE: NEGATIVE
GLUCOSE, UA: NEGATIVE mg/dL
HGB URINE DIPSTICK: NEGATIVE
Ketones, ur: NEGATIVE mg/dL
Leukocytes, UA: NEGATIVE
Nitrite: NEGATIVE
Protein, ur: NEGATIVE mg/dL
SPECIFIC GRAVITY, URINE: 1.014 (ref 1.005–1.030)
pH: 6 (ref 5.0–8.0)

## 2016-09-26 MED ORDER — GABAPENTIN 300 MG PO CAPS
300.0000 mg | ORAL_CAPSULE | Freq: Once | ORAL | Status: AC
  Administered 2016-09-26: 300 mg via ORAL
  Filled 2016-09-26: qty 1

## 2016-09-26 MED ORDER — MECLIZINE HCL 25 MG PO TABS: 25.0000 mg | ORAL_TABLET | Freq: Three times a day (TID) | ORAL | 0 refills | Status: DC | PRN

## 2016-09-26 MED ORDER — MECLIZINE HCL 25 MG PO TABS
25.0000 mg | ORAL_TABLET | Freq: Once | ORAL | Status: AC
  Administered 2016-09-26: 25 mg via ORAL
  Filled 2016-09-26: qty 1

## 2016-09-26 MED ORDER — ONDANSETRON HCL 4 MG/2ML IJ SOLN
4.0000 mg | Freq: Once | INTRAMUSCULAR | Status: AC | PRN
  Administered 2016-09-26: 4 mg via INTRAVENOUS
  Filled 2016-09-26: qty 2

## 2016-09-26 MED ORDER — DIAZEPAM 5 MG PO TABS: ORAL_TABLET | ORAL | 0 refills | Status: DC

## 2016-09-26 MED ORDER — LORAZEPAM 2 MG/ML IJ SOLN
1.0000 mg | Freq: Once | INTRAMUSCULAR | Status: AC
  Administered 2016-09-26: 1 mg via INTRAVENOUS
  Filled 2016-09-26: qty 1

## 2016-09-26 MED ORDER — ONDANSETRON 4 MG PO TBDP: 4.0000 mg | ORAL_TABLET | ORAL | 0 refills | Status: DC | PRN

## 2016-09-26 NOTE — Discharge Instructions (Signed)
See your doctor for recheck next week.

## 2016-09-26 NOTE — ED Notes (Signed)
ED Provider at bedside. 

## 2016-09-26 NOTE — ED Provider Notes (Signed)
[facility] DEPT Provider Note   CSN: [id number] Arrival date & time: 09/26/16  1779     History   Chief Complaint Chief Complaint  Patient presents with  . Dizziness  . Emesis    HPI [name] is a 61 y.o. female.  HPI Reports that she developed dizziness this morning. She rolled over in bed and had a spinning quality. It was severe. Her husband had to assist her in walking to the bathroom. Patient was staggering and would have fallen without assistance although did not fall and did not have associated injury. She reports nausea with associated dry heaves. No associated headache. No associated visual change. Patient has prior history of lower extremity neuropathic pains. She denies any new or different weakness numbness tingling of extremities. She denies prior history of vertigo. Patient has had a diagnosis of URI for about the past 10 days. She has taken a course of amoxicillin. She was told she had fluid behind her ears. Patient denies any associated fever. She reports she did have some cough and some congestion in association with this. After recheck with her PCP yesterday her ears were deemed to be cleared of fluid and she was started on Zyrtec. Past Medical History:  Diagnosis Date  . Anxiety    very high  . GERD (gastroesophageal reflux disease)   . Heart murmur    Pt saw Cardiologist when she was in her 64s, not since.  . Numbness and tingling     Patient Active Problem List   Diagnosis Date Noted  . Right leg pain 07/24/2016  . Right leg swelling 07/24/2016  . Paresthesia 06/12/2016  . 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 GASTRITIS, ACUTE 05/26/2007  . 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 GALLBLADDER DISEASE 03/11/2007    Past Surgical History:  Procedure Laterality Date  . CESAREAN SECTION    . SHOULDER ARTHROSCOPY WITH SUBACROMIAL DECOMPRESSION AND OPEN ROTATOR C Right 12/09/2013   Procedure: RIGHT SHOULDER ARTHROSCOPY WITH SUBACROMIAL  DECOMPRESSION AND MINI OPEN ROTATOR CUFF REPAIR, AND BICEPS TENDODESIS;  Surgeon: Augustin Schooling, MD;  Location: [facility];  Service: Orthopedics;  Laterality: Right;  . TONSILLECTOMY      OB History    No data available       Home Medications    Prior to Admission medications   Medication Sig Start Date End Date Taking? Authorizing Provider  alendronate (FOSAMAX) 70 MG tablet  07/20/16   [provider]  aspirin 81 MG chewable tablet Chew 81 mg by mouth daily.    [provider]  b complex vitamins capsule Take 1 capsule by mouth daily.    [provider]  Cholecalciferol (VITAMIN D) 2000 UNITS CAPS Take 2,000 Units by mouth daily.    [provider]  cycloSPORINE (RESTASIS) 0.05 % ophthalmic emulsion Place 1 drop into both eyes 2 (two) times daily.    [provider]  diazepam (VALIUM) 5 MG tablet Half tablet every 8 hours as needed for vertigo 09/26/16   Charlesetta Shanks, MD  gabapentin (NEURONTIN) 300 MG capsule Take 1 capsule (300 mg total) by mouth 3 (three) times daily. 07/24/16   Marcial Pacas, MD  latanoprost (XALATAN) 0.005 % ophthalmic solution Place 1 drop into both eyes at bedtime.    [provider]  Magnesium 250 MG TABS Take 1 tablet by mouth daily.     [provider]  meclizine (ANTIVERT) 25 MG tablet Take 1 tablet (25 mg total) by mouth 3 (  three) times daily as needed for dizziness. 09/26/16   Charlesetta Shanks, MD  Omega-3 Fatty Acids (FISH OIL PO) Take 1 capsule by mouth daily.    [provider]  ondansetron (ZOFRAN ODT) 4 MG disintegrating tablet Take 1 tablet (4 mg total) by mouth every 4 (four) hours as needed for nausea or vomiting. 09/26/16   Charlesetta Shanks, MD  ranitidine (ZANTAC) 150 MG tablet Take 150 mg by mouth daily.    [provider]    Family History Family History  Problem Relation Age of Onset  . Arthritis Mother   . Hyperlipidemia Mother   . Skin cancer Father   . Depression  Father   . Parkinson's disease Father     Social History Social History  Substance Use Topics  . Smoking status: Never Smoker  . Smokeless tobacco: Never Used  . Alcohol use Yes     Comment: rarely     Allergies   Patient has no known allergies.   Review of Systems Review of Systems 10 Systems reviewed and are negative for acute change except as noted in the HPI.   Physical Exam Updated Vital Signs BP 115/75   Pulse 75   Resp 15   SpO2 99%   Physical Exam  Constitutional: She is oriented to person, place, and time. She appears well-developed and well-nourished. No distress.  HENT:  Head: Normocephalic and atraumatic.  Bilateral TMs normal. Ear canals are clear. Posterior oropharynx widely patent no erythema no exudate. Dental condition very good.  Eyes: Pupils are equal, round, and reactive to light. Conjunctivae and EOM are normal.  No nystagmus  Neck: Neck supple.  Cardiovascular: Normal rate and regular rhythm.   No murmur heard. Pulmonary/Chest: Effort normal and breath sounds normal. No respiratory distress.  Abdominal: Soft. There is no tenderness.  Musculoskeletal: Normal range of motion. She exhibits no edema or tenderness.  Neurological: She is alert and oriented to person, place, and time. No cranial nerve deficit. She exhibits normal muscle tone. Coordination normal.  Bilateral finger-nose exam normal. Bilateral heel shin exam normal. 5\5 upper and lower motor strength.  Skin: Skin is warm and dry.  Psychiatric: She has a normal mood and affect.  Nursing note and vitals reviewed.    ED Treatments / Results  Labs (all labs ordered are listed, but only abnormal results are displayed) Labs Reviewed  COMPREHENSIVE METABOLIC PANEL - Abnormal; Notable for the following:       Result Value   Glucose, Bld 127 (*)    Calcium 8.4 (*)    All other components within normal limits  URINALYSIS, ROUTINE W REFLEX MICROSCOPIC - Abnormal; Notable for the  following:    APPearance HAZY (*)    All other components within normal limits  LIPASE, BLOOD  CBC    EKG  EKG Interpretation None       Radiology Mr Brain Wo Contrast (neuro Protocol)  Result Date: 09/26/2016 CLINICAL DATA:  Vertigo and ataxia. Dizziness with movement. Possible inner ear infection with recent antibiotics. EXAM: MRI HEAD WITHOUT CONTRAST TECHNIQUE: Multiplanar, multiecho pulse sequences of the brain and surrounding structures were obtained without intravenous contrast. COMPARISON:  None. FINDINGS: Brain: No acute infarction, hemorrhage, hydrocephalus, extra-axial collection or mass lesion. No findings in the CP angle cisterns or brainstem to explain dizziness. Minimal FLAIR hyperintensity around the lateral ventricles. No demyelinating pattern. Grossly symmetric signal in the labyrinth. Vascular: Major flow voids are preserved, including in the vertebrobasilar system. Skull and upper cervical spine:  Negative Sinuses/Orbits: Negative.  No mastoiditis or middle ear fluid. IMPRESSION: Negative study.  No explanation for symptoms. Electronically Signed   By: Monte Fantasia M.D.   On: 09/26/2016 10:34    Procedures Procedures (including critical care time)  Medications Ordered in ED Medications  ondansetron (ZOFRAN) injection 4 mg (4 mg Intravenous Given 09/26/16 0817)  meclizine (ANTIVERT) tablet 25 mg (25 mg Oral Given 09/26/16 0901)  gabapentin (NEURONTIN) capsule 300 mg (300 mg Oral Given 09/26/16 0901)  LORazepam (ATIVAN) injection 1 mg (1 mg Intravenous Given 09/26/16 0943)     Initial Impression / Assessment and Plan / ED Course  I have reviewed the triage vital signs and the nursing notes.  Pertinent labs & imaging results that were available during my care of the patient were reviewed by me and considered in my medical decision making (see chart for details).     Final Clinical Impressions(s) / ED Diagnoses   Final diagnoses:  Vertigo   MRI negative for  cerebellar infarct. Patient does not have associated headache. Neurologic examination is normal. Patient has responded well to Antivert and Zofran. At this time, most consistent with benign positional vertigo. Patient instructed on follow-up and return precautions. New Prescriptions New Prescriptions   DIAZEPAM (VALIUM) 5 MG TABLET    Half tablet every 8 hours as needed for vertigo   MECLIZINE (ANTIVERT) 25 MG TABLET    Take 1 tablet (25 mg total) by mouth 3 (three) times daily as needed for dizziness.   ONDANSETRON (ZOFRAN ODT) 4 MG DISINTEGRATING TABLET    Take 1 tablet (4 mg total) by mouth every 4 (four) hours as needed for nausea or vomiting.     Charlesetta Shanks, MD 09/26/16 1054

## 2016-09-26 NOTE — ED Notes (Signed)
Patient transported to MRI 

## 2016-09-26 NOTE — ED Notes (Signed)
Patient returned from MRI.

## 2016-09-26 NOTE — ED Triage Notes (Addendum)
Pt states that she has had a virus x 10 days, finished antibiotics for inner ear (possible)infection but woke up this morning dizzy (worse w/ movement) and N/V. Neuro intact.  Alert and oriented.

## 2016-10-08 DIAGNOSIS — L814 Other melanin hyperpigmentation: Secondary | ICD-10-CM | POA: Diagnosis not present

## 2016-10-08 DIAGNOSIS — D225 Melanocytic nevi of trunk: Secondary | ICD-10-CM | POA: Diagnosis not present

## 2016-10-08 DIAGNOSIS — D1801 Hemangioma of skin and subcutaneous tissue: Secondary | ICD-10-CM | POA: Diagnosis not present

## 2016-10-08 DIAGNOSIS — L820 Inflamed seborrheic keratosis: Secondary | ICD-10-CM | POA: Diagnosis not present

## 2016-11-26 DIAGNOSIS — Z23 Encounter for immunization: Secondary | ICD-10-CM | POA: Diagnosis not present

## 2016-11-27 DIAGNOSIS — M79604 Pain in right leg: Secondary | ICD-10-CM | POA: Diagnosis not present

## 2016-12-08 DIAGNOSIS — M4125 Other idiopathic scoliosis, thoracolumbar region: Secondary | ICD-10-CM | POA: Diagnosis not present

## 2016-12-08 DIAGNOSIS — M5136 Other intervertebral disc degeneration, lumbar region: Secondary | ICD-10-CM | POA: Diagnosis not present

## 2016-12-10 ENCOUNTER — Telehealth: Payer: Self-pay | Admitting: *Deleted

## 2016-12-10 ENCOUNTER — Ambulatory Visit: Payer: Commercial Managed Care - HMO | Admitting: Neurology

## 2016-12-10 ENCOUNTER — Ambulatory Visit: Payer: 59 | Attending: Orthopedic Surgery | Admitting: Physical Therapy

## 2016-12-10 ENCOUNTER — Encounter: Payer: Self-pay | Admitting: Physical Therapy

## 2016-12-10 DIAGNOSIS — M5441 Lumbago with sciatica, right side: Secondary | ICD-10-CM | POA: Diagnosis present

## 2016-12-10 NOTE — Telephone Encounter (Signed)
Pt no showed appt today

## 2016-12-10 NOTE — Therapy (Signed)
[facility] [address], [place], [place], [address] Phone: [phone]   Fax:  [phone]  Physical Therapy Evaluation  Patient Details  Name: [name] MRN: [id number] Date of Birth: [birth year] Referring Provider: Dr. Latanya Maudlin  Encounter Date: 12/10/2016      PT End of Session - 12/10/16 1217    Visit Number 1   Date for PT Re-Evaluation 01/07/17   PT Start Time 1025  came late   PT Stop Time 1100   PT Time Calculation (min) 35 min   Activity Tolerance Patient tolerated treatment well   Behavior During Therapy [facility] for tasks assessed/performed      Past Medical History:  Diagnosis Date  . Anxiety    very high  . GERD (gastroesophageal reflux disease)   . Heart murmur    Pt saw Cardiologist when she was in her 65s, not since.  . Neuropathy 2016   in right leg  . Numbness and tingling   . Osteoporosis     Past Surgical History:  Procedure Laterality Date  . CESAREAN SECTION    . SHOULDER ARTHROSCOPY WITH SUBACROMIAL DECOMPRESSION AND OPEN ROTATOR C Right 12/09/2013   Procedure: RIGHT SHOULDER ARTHROSCOPY WITH SUBACROMIAL DECOMPRESSION AND MINI OPEN ROTATOR CUFF REPAIR, AND BICEPS TENDODESIS;  Surgeon: Augustin Schooling, MD;  Location: [place];  Service: Orthopedics;  Laterality: Right;  . TONSILLECTOMY      There were no vitals filed for this visit.       Subjective Assessment - 12/10/16 1029    Subjective Patient reports chronic back pain and has had physical therapy that has helped her in the past.  Patient fell in 04/2016 when she had a compression fracture of T12. Patient is shorter from it.    Limitations Sitting;Standing   How long can you sit comfortably? sometimes bothers her   How long can you stand comfortably? sometimes hurts her   Patient Stated Goals learn sleeping postion; learning ways to manage back pain;    Currently in Pain? Yes   Pain Score 5    Pain Location Back   Pain  Orientation Lower;Mid   Pain Descriptors / Indicators Numbness;Sharp;Aching   Pain Type Chronic pain   Pain Radiating Towards numbness on left T5-T8; nerve pain in right lower leg   Pain Onset More than a month ago   Pain Frequency Intermittent   Aggravating Factors  getting up in the morning; sleeping; bending forward; laying flat   Pain Relieving Factors leaning back on foam cushion; lay in fetal position            [facility] PT Assessment - 12/10/16 0001      Assessment   Medical Diagnosis M41.25 Other Idiopathic scoliosis, thoracolumbar region; M51.36 Degenerative Lumbar disc   Referring Provider Dr. Latanya Maudlin   Onset Date/Surgical Date 06/10/16   Prior Therapy yes     Precautions   Precautions Other (comment)   Precaution Comments osteoporosis, no bending right knee fully      Restrictions   Weight Bearing Restrictions No     Balance Screen   Has the patient fallen in the past 6 months No   Has the patient had a decrease in activity level because of a fear of falling?  No   Is the patient reluctant to leave their home because of a fear of falling?  No     Home Ecologist residence  Prior Function   Level of Independence Independent   Vocation Part time employment  one day per week    Vocation Requirements sitting   Leisure gym 1 time per week; elliptical; bicycle     Cognition   Overall Cognitive Status Within Functional Limits for tasks assessed     Posture/Postural Control   Posture/Postural Control Postural limitations   Postural Limitations Forward head;Rounded Shoulders   Posture Comments scoliosis; left ilium his higher than the right     ROM / Strength   AROM / PROM / Strength AROM;Strength     AROM   Lumbar Extension decreased by 25%   Lumbar - Left Side Bend decreased by 25%     Strength   Right Hip ABduction 4/5   Left Hip Extension 4/5   Left Hip ABduction 3+/5     Palpation   Palpation comment  tenderness located in bil. lumbar paraspinals            Objective measurements completed on examination: See above findings.                  PT Education - 12/10/16 1217    Education provided Yes   Education Details walking for 30 min per day for exercise   Person(s) Educated Patient   Methods Explanation   Comprehension Verbalized understanding             PT Long Term Goals - 12/10/16 1050      PT LONG TERM GOAL #1   Title understand correct ways to sleep to not strain her back   Time 4   Period Weeks   Status New   Target Date 01/07/17     PT LONG TERM GOAL #2   Title understand how to perform correct body mechanics with daily tasks to not strain back and right knee   Time 4   Period Weeks   Target Date 01/07/17     PT LONG TERM GOAL #3   Title independent with HEP for back strengthening for at home and at the gym    Time 4   Period Weeks   Status New   Target Date 01/07/17     PT LONG TERM GOAL #4   Title independent with stretching program that will not flare up her back   Time 4   Period Weeks   Status New   Target Date 01/07/17     PT LONG TERM GOAL #5   Title understand information on osteoporosis, postures to avoid, ways to manage   Time 4   Period Weeks   Status New   Target Date 01/07/17                Plan - 12/10/16 1218    Clinical Impression Statement Patient is a 61 year old female with lumbar pain and scoliosis.  Patient had a compression fracture T12  04/2016 when she fell. Patient reports pain level 5/10 intermittently with numbness on Left side of T5-T8 and nerve pain in right lower leg.  Patient has increased pain with bending forward, laying flat, in the morning and during sleep.  Patient has decreased lumbar mobility with left sidebending and extension.  Patient has weakness in her core and hips.  Patient is not educated on how to protect her back  and wants education. Patient will benefit from skilled  therapy to reduce pain, education on correct ways to protect spine and exercise.    History and Personal Factors  relevant to plan of care: osteoporosis; Scoliosis; nueropathy in right leg; MD said to not fully flex right knee due to nerve compression   Clinical Presentation Evolving   Clinical Presentation due to: pain is evolving due to making it difficulty for her to do activities, not able to exercise; not able to lift items   Clinical Decision Making Moderate   Rehab Potential Excellent   Clinical Impairments Affecting Rehab Potential osteoporosis; Scoliosis; nueropathy in right leg; MD said to not fully flex right knee due to nerve compression   PT Frequency 2x / week   PT Duration 4 weeks   PT Treatment/Interventions Cryotherapy;Electrical Stimulation;Moist Heat;Therapeutic activities;Ultrasound;Therapeutic exercise;Neuromuscular re-education;Patient/family education;Manual techniques;Dry needling;Energy conservation   PT Next Visit Plan body mechanics; back and core strength; scoliosis stretching   PT Home Exercise Plan osteo do and do not; posture and body mechanics   Consulted and Agree with Plan of Care Patient      Patient will benefit from skilled therapeutic intervention in order to improve the following deficits and impairments:  Pain, Decreased strength, Decreased mobility, Decreased activity tolerance  Visit Diagnosis: Acute bilateral low back pain with right-sided sciatica     Problem List Patient Active Problem List   Diagnosis Date Noted  . Right leg pain 07/24/2016  . Right leg swelling 07/24/2016  . Paresthesia 06/12/2016  . 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 GASTRITIS, ACUTE 05/26/2007  . 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 GALLBLADDER DISEASE 03/11/2007    Earlie Counts, PT 12/10/16 12:43 PM   [facility] [address], [place], [place], [address] Phone: [phone]   Fax:   [phone]  Name: [name] MRN: [id number] Date of Birth: [birth year]

## 2016-12-11 ENCOUNTER — Encounter: Payer: Self-pay | Admitting: Neurology

## 2016-12-11 ENCOUNTER — Telehealth: Payer: Self-pay | Admitting: *Deleted

## 2016-12-11 NOTE — Telephone Encounter (Signed)
Having jolting pain in her right leg.  Has an appt. On November 19 but would like to address this before then.  Please call.

## 2016-12-12 NOTE — Telephone Encounter (Signed)
Spoke to patient - states her orthopaedic doctor placed her on twelve days of Prednisone.  She is currently on day 4 and starting to feel better.  She would like to keep her 01/26/17 appt but will call us back if she feels that an earlier visit is required.  She apologized for missing her follow up on 12/30/16 - she had the wrong date written down.

## 2016-12-15 ENCOUNTER — Ambulatory Visit: Payer: 59 | Admitting: Physical Therapy

## 2016-12-15 ENCOUNTER — Encounter: Payer: Self-pay | Admitting: Physical Therapy

## 2016-12-15 DIAGNOSIS — M5441 Lumbago with sciatica, right side: Secondary | ICD-10-CM | POA: Diagnosis not present

## 2016-12-15 NOTE — Addendum Note (Signed)
Addended by: Earlie Counts F on: 12/15/2016 03:42 PM   Modules accepted: Orders

## 2016-12-15 NOTE — Patient Instructions (Addendum)
Sleeping on Back    Place pillow under knees. A pillow with cervical support and a roll around waist are also helpful.  Place a towel roll under lumbar spine Copyright  VHI. All rights reserved.    Sleeping on Side    Place pillow between knees and feet. Use cervical support under neck and a roll around waist as needed.   Copyright  VHI. All rights reserved.  Lay on left side with pillow rolled up on left waist to open up the right back. Lay for 1 min 1 time per day.   Getting Into and Out of Bed    Sit on edge of bed, feet on floor. Lie down sideways. Roll over onto back. Keep back straight. Use hand and elbow to lower and raise trunk. Move shoulders and hips at same rate to avoid twisting the back. To get out of bed, reverse movement.   Copyright  VHI. All rights reserved.    Reaching    DO: Use "Golfer's Reach" when getting item from refrigerator, washing machine, dryer, dishwasher, etc. DO: Kneel to get item from low area. DON'T: Bend your back to get item from low area.  Copyright  VHI. All rights reserved.  Eating    Sit, protecting natural arch in low back. Bring food to mouth, not mouth to food. Do not lean on elbows or arms.  Copyright  VHI. All rights reserved.  Bucket    Pretend pelvis is a bucket; top of bucket at waistline. Pretend a tail is attached to end of tailbone. Rib cage still, alternately tuck tail under; stick tail out (tilts "bucket" backward and forward). Hold pelvis in mid-position _5__ seconds. Relax. Repeat frequently during the day. Do when standing and waiting or on a aline.  Copyright  VHI. All rights reserved.  Waist Lengthener    Feel top of pelvis (hipbones) all the way around and bottom of ribs all the way around. Lengthen space between them by lifting ribs up and away from pelvis. Repeat frequently during the day.  Copyright  VHI. All rights reserved.  Waist Lengthener    Feel top of pelvis (hipbones) all the  way around and bottom of ribs all the way around. Lengthen space between them by lifting ribs up and away from pelvis. Repeat frequently during the day. When standing and waiting or when in the kitchen.  Copyright  VHI. All rights reserved.  Lifting Principles  .Maintain proper posture and head alignment. .Slide object as close as possible before lifting. .Move obstacles out of the way. .Test before lifting; ask for help if too heavy. .Tighten stomach muscles without holding breath. .Use smooth movements; do not jerk. .Use legs to do the work, and pivot with feet. .Distribute the work load symmetrically and close to the center of trunk. .Push instead of pull whenever possible.  Copyright  VHI. All rights reserved.  Deep Squat    Squat and lift with both arms held against upper trunk. Tighten stomach muscles without holding breath. Use smooth movements to avoid jerking.  Copyright  VHI. All rights reserved.                                             DO's and DON'T's   Avoid and/or Minimize positions of forward bending ( flexion)  Side bending and rotation of the trunk  Especially when movements occur  together   When your back aches:   Don't sit down   Lie down on your back with a small pillow under your head and one under your knees or as outlined by our therapist. Or, lie in the 90/90 position ( on the floor with your feet and legs on the sofa with knees and hips bent to 90 degrees)  Tying or putting on your shoes:   Don't bend over to tie your shoes or put on socks.  Instead, bring one foot up, cross it over the opposite knee and bend forward (hinge) at the hips to so the task.  Keep your back straight.  If you cannot do this safely, then you need to use long handled assistive devices such as a shoehorn and sock puller.  Exercising:  Don't engage in ballistic types of exercise routines such as high-impact aerobics or jumping rope  Don't do exercises in the gym  that bring you forward (abdominal crunches, sit-ups, touching your  toes, knee-to-chest, straight leg raising.)  Follow a regular exercise program that includes a variety of different weight-bearing activities, such as low-impact aerobics, T' ai chi or walking as your physical therapist advises  Do exercises that emphasize return to normal body alignment and strengthening of the muscles that keep your back straight, as outlined in this program or by your therapist  Household tasks:  Don't reach unnecessarily or twist your trunk when mopping, sweeping, vacuuming, raking, making beds, weeding gardens, getting objects ou of cupboards, etc.  Keep your broom, mop, vacuum, or rake close to you and mover your whole body as you move them. Walk over to the area on which you are working. Arrange kitchen, bathroom, and bedroom shelves so that frequently used items may be reached without excessive bending, twisting, and reaching.  Use a sturdy stool if necessary.  Don't bend from the waist to pick up something up  Off the floor, out of the trunk of your car, or to brush your teeth, wash your face, etc.   Bend at the knees, keeping back straight as possible. Use a reacher if necessary.   Prevention of fracture is the so-called "BOTTOm -Line" in the management of OSTEOPOROSIS. Do not take unnecessary chances in movement. Once a compression fracture occurs, the process is very difficult to control; one fracture is frequently followed by many more.     Osteoporosis   What is Osteoporosis?  - A silent disease in which the skeleton is weakened by decreased bone density. - Characterized by low bone mass, deterioration of bone, and increased risk of fracture postmenopausal (primary) or the result of an identifiable condition/event (secondary) - Commonly found in the wrists, spine, and hips; these are high-risk stress areas and very susceptible to fractures.  The Facts: - There are 1.5 million  fractures/year o 500,000 spine; 250,000 hip with over 60,000 nursing home admissions secondary to hip fracture; and 200,000 wrist - After hip fracture, only 50% of people able to walk independently prior to the fracture return to independent ambulation. - Bone mass: Peaks at age 41-30, and begins declining at age 45-50.   Osteoporosis is defined by the [facility]) as:  NOF/WHO Criteria for Interpreting Results of Bone Density Assessment  Results Diagnosis  Within 1 standard deviation (SD) of young adult mean Normal  Between 1 and -2.5 SD below mean, repeat in 2 years Low bone mass (osteopenia)  Greater than -2.5 SD below mean Osteoporosis  Greater than -2.5 SD below mean  and one or more fragility fractures exist Severe Osteoporosis  *Results can be affected by positioning of the body in the DEXA scan, presence of current or old fractures, arthritis, extraneous calcifications.    Osteoporosis is not just a women's disease!  - 30-40% of women will develop osteoporosis - 5-15% of males will develop osteoporosis   What are the risk factors?  1. Female 2. Thin, small frame 3. Caucasian, Asian race 4. Early menopause (<38 years old)/amenorrhea/delayed puberty 63. Old age 39. Family history (fractures, stooped posture)\ 7. Low calcium diet 8. Sedentary lifestyle 9. Alcohol, Caffeine, Smoking 10. Malnutrition, GI Disease 11. Prolonged use of Glucocorticoids (Prednisone), Meds to treat asthma, arthritis, cancers, thyroid, and anti-seizure meds.  How do I know for sure?  Get a BONE DENSITY TEST!  This measures bone loss and it's painless, non-invasive, and only takes 5-10 minutes!  What can I do about it?  ? Decrease your risk factors (alcohol, caffeine, smoking) ? Helpful medications (see next page) ? Adequate Calcium and Vitamin D intake ? Get active! o Proper posture - Sit and stand tall! No slouching or twisting o Weight-Bearing Exercise - walking, stair  climbing, elliptical; NO jogging or high-impact exercise. o Resistive Exercise - Cybex weight equipment, Nautilus, dumbbells, therabands  **Be sure to maintain proper alignment when lifting any weight!!  **When using equipment, avoid abdominal exercises which involve "crunching" or curling or twisting the trunk, biceps machines, cross-country machines, moving handlebars, or ANY MACHINE WITH ROTATION OR FORWARD BENDING!!!           Approved Pharmacologic Management of Osteoporosis  Agent Approved for prevention Approved for treatment BMD increased spine/hip Fracture reduction  Estrogen/Hormone Therapy (Estrace, Estratab, Ogen, Premarin, Vivell, Prempro, Femhert, Orthoest) Yes Yes 3-6% 35% spine and hip  Bisphosphonates  (Fosamax, Actonel, Boniva) Yes Yes 3-8% 35-50% spine and non-spine  Calcitonin (Miacalcin, Calcimar, Fortical) No Yes 0-3% None stated  Raloxifene (Evista) Yes Yes 2-3% 30-55%  Parathyroid Hormone (Forteo) No Yes, only in those at high risk for fracture None stated 53-65%     Recommended Daily Calcium Intakes   Population Group NIH/NOF* (mg elemental calcium)  Children 1-10 years [phone]  Children 11-24 years 44-1500  Men and Women 25-64 years At least 1200  Pregnant/Lactating At least 1200  Postmenopausal women with hormone replacement therapy At least 1200  Postmenopausal women without   hormone replacement therapy At least 1200  Men and women 65 + At least 1200  *In 1987, 1990, 1994, and 2000, the NIH held consensus conferences on osteoporosis and calcium.  This column shows the most recent recommendations regarding calcium intak for preventing and managing osteoporosis.          Calcium Content of Selected Foods  Dairy Foods Calcium Content (mg) Non-Dairy Foods Calcium Content (mg)  Buttermilk, 1 cup 300 Calcium-fortified juice, 1 cup 300  Milk, 1 cup 300 Salmon, canned with Bones, 2 oz 100  Lactaid milk, 1 cup 300-500 Oysters,  raw 13-19 medium 226  Soy milk, 1 cup 200-300 Sardines, canned with bones, 3 oz 372  Yogurt (plain, lowfat) 1 cup 250-300 Shrimp, canned 3 oz 98  Frozen yogurt (fruit) 1 cup 200-600 Collard greens, cooked 1 cup 357  Cheddar, mozzarella, or [place] cheese, 1oz 205 Broccoli, cooked 1 cup 78  Cottage cheese (lowfat) 4 oz 200 Soybeans, cooked 1 cup 131  Part-skim ricotta cheese, 4oz 335 Tofu, 4oz* *  Vanilla ice cream, 1 cup 120-300    *Calcium content of tofu varies  depending on processing method; check nutritional label on package for precise calcium content.     Suggested Guidelines for Calcium Supplement Use:  ? Calcium is absorbed most efficiently if taken in small amounts throughout the day.  Always divide the daily dose into smaller amounts if the total daily dose is 500mg  or more per day.  The body cannot use more than 500mg  Calcium at any one time. ? The use of manufactured supplements is encouraged.  Calcium as bone meal or dolomite may contain lead or other heavy metals as contaminants. ? Calcium supplements should not be taken with high fiber meals or with bulk forming laxatives. ? If calcium carbonate is used as the supplement form, it should be taken with meals to assure that stomach acid production is present to facilitate optimal dissolution and absorption of calcium.  This is important if atrophic gastritis with hypo- or achlorhydria is present, which it is in 20-50% of older individuals. ? It is important to drink plenty of fluids while using the supplement to help reduce problems with side effects like constipation or bloating.  If these symptoms become a problem, switching to another form of supplement may be the answer. ? Another alternative is calcium-fortified foods, including fruit juices, cereals, and breads.  These foods are now marketed with added calcium and may be less likely to cause side effects. ? Those with personal or family histories of kidney stones  should be monitored to assure that hypercalcuria does not occur. CALCIUM INTAKE QUIZ  Dairy products are the primary source of calcium for most people.  For a quick estimate of your daily calcium intake, complete the following steps:  1. Use the chart below to determine your daily intake of calcium from diary foods. Servings of dairy per day 1 2 3 4 5 6 7 8   Milligrams (mg) of calcium: 250 [phone] 1250 1500 1750 2000   2.  Enter your total daily calcium intake from dairy foods:     _____mg  3.  Add 350 mg, which is the average for all other dietary sources:                 +            350 mg  4.  The sum of your total daily calcium intake:               ______mg  5.  Enter the recommended calcium intake for your age from the chart below;         ______mg  6.  Enter your daily intake from step 4 above and subtract:                             -        _______mg  7.  The result is how much additional calcium you need:                                          ______mg      Recommended Daily Calcium Intake  Population Calcium (mg)  Children 1-10 years [phone]  Children 11-24 years 49-1500  Men and women 25-64 At least 1200  Pregnant/Lactating At least 1200  Postmenopausal women with hormone replacement therapy At least 1200  Postmenopausal women without hormone replacement therapy At least 1200  Men and women 65+ At least [place]   1. Remove throw rugs and make certain carpet edges are securely fastened to the floor.  2. Reduce clutter, especially in traffic areas of the home.   3. Install/maintain sturdy handrails at stairs.  4. Increase wattage of lighting in hallways, bathrooms, kitchens, stairwells, and entrances to home.  5. Use night-lights near bed, in hallways, and in bathroom to improve night safety.  6. Install safety handrails in shower, tub, and around toilet.  Bathtubs and shower stalls should have non-skid  surfaces.  7. When you must reach for something high, use a safety step stool, one with wide steps and a friction surface to stand on.  A type equipped with a high handrail is preferred.  8. If a cane or other walking aid has been recommended, use it to help increase your stability.  9. Wear supportive, cushioned, low-heeled shoes.  Avoid "scuffs" (backless bedroom slippers) and high heels.  10. Avoid rushing to answer a phone, doorbell, or anything else!  A portable phone that you can take from room to room with you is a good idea for security and safety.  11. Exercise regularly and stay active!!    [place] www.NOF.org   Exercise for Osteoporosis; A Safe and Effective Way to Build Bone Density and Muscle Strength By: Burnard Hawthorne, M.A. [place] [address] [place], [place] [address] Phone # [phone] Fax [phone]

## 2016-12-15 NOTE — Therapy (Signed)
[facility] [address] [place], [place], [address] Phone: [phone]   Fax:  [phone]  Physical Therapy Treatment  Patient Details  Name: [name] MRN: [id number] Date of Birth: 1955-03-11 Referring Provider: Dr. Latanya Maudlin  Encounter Date: 12/15/2016      PT End of Session - 12/15/16 1530    Visit Number 2   Date for PT Re-Evaluation 01/07/17   Authorization Type UHC   Authorization - Visit Number 2   Authorization - Number of Visits 60   PT Start Time 7628   PT Stop Time 1526   PT Time Calculation (min) 41 min   Activity Tolerance Patient tolerated treatment well   Behavior During Therapy [name] for tasks assessed/performed      Past Medical History:  Diagnosis Date  . Anxiety    very high  . GERD (gastroesophageal reflux disease)   . Heart murmur    Pt saw Cardiologist when she was in her 3s, not since.  . Neuropathy 2016   in right leg  . Numbness and tingling   . Osteoporosis     Past Surgical History:  Procedure Laterality Date  . CESAREAN SECTION    . SHOULDER ARTHROSCOPY WITH SUBACROMIAL DECOMPRESSION AND OPEN ROTATOR C Right 12/09/2013   Procedure: RIGHT SHOULDER ARTHROSCOPY WITH SUBACROMIAL DECOMPRESSION AND MINI OPEN ROTATOR CUFF REPAIR, AND BICEPS TENDODESIS;  Surgeon: Augustin Schooling, MD;  Location: [place];  Service: Orthopedics;  Laterality: Right;  . TONSILLECTOMY      There were no vitals filed for this visit.      Subjective Assessment - 12/15/16 1449    Subjective I felt good after the therapy.  I have been doing my 30 minutes of walking.    Limitations Sitting;Standing   How long can you sit comfortably? sometimes bothers her   How long can you stand comfortably? sometimes hurts her   Patient Stated Goals learn sleeping postion; learning ways to manage back pain;    Currently in Pain? Yes   Pain Score 5    Pain Location Back   Pain Orientation Lower;Mid   Pain  Descriptors / Indicators Numbness;Aching   Pain Type Chronic pain   Pain Onset More than a month ago   Pain Frequency Intermittent   Aggravating Factors  getting up in the morning; sleeping; bending forward; laying flat   Pain Relieving Factors leaning back on foam cushion; lay in fetal postion   Multiple Pain Sites No                                 PT Education - 12/15/16 1529    Education provided Yes   Education Details information on osteoporosis, body mechanics, posture and lifting   Person(s) Educated Patient   Methods Explanation;Demonstration;Verbal cues;Handout   Comprehension Verbalized understanding;Returned demonstration             PT Long Term Goals - 12/10/16 1050      PT LONG TERM GOAL #1   Title understand correct ways to sleep to not strain her back   Time 4   Period Weeks   Status New   Target Date 01/07/17     PT LONG TERM GOAL #2   Title understand how to perform correct body mechanics with daily tasks to not strain back and right knee   Time 4   Period Weeks   Target  Date 01/07/17     PT LONG TERM GOAL #3   Title independent with HEP for back strengthening for at home and at the gym    Time 4   Period Weeks   Status New   Target Date 01/07/17     PT LONG TERM GOAL #4   Title independent with stretching program that will not flare up her back   Time 4   Period Weeks   Status New   Target Date 01/07/17     PT LONG TERM GOAL #5   Title understand information on osteoporosis, postures to avoid, ways to manage   Time 4   Period Weeks   Status New   Target Date 01/07/17               Plan - 12/15/16 1531    Clinical Impression Statement Patient understands correct body mechanics with lifting, eating, sleeping,  to protect her back.  Patient understands postures to avoid with osteoporosis.  Patient will benefit from skilled therapy to reduce pain, education on correct ways to protect her spine and exercise.     Rehab Potential Excellent   Clinical Impairments Affecting Rehab Potential osteoporosis; Scoliosis; nueropathy in right leg; MD said to not fully flex right knee due to nerve compression   PT Frequency 2x / week   PT Duration 4 weeks   PT Treatment/Interventions Cryotherapy;Electrical Stimulation;Moist Heat;Therapeutic activities;Ultrasound;Therapeutic exercise;Neuromuscular re-education;Patient/family education;Manual techniques;Dry needling;Energy conservation   PT Next Visit Plan back and core strength, scoliosis stretching   PT Home Exercise Plan progress as needed   Consulted and Agree with Plan of Care Patient      Patient will benefit from skilled therapeutic intervention in order to improve the following deficits and impairments:  Pain, Decreased strength, Decreased mobility, Decreased activity tolerance  Visit Diagnosis: Acute bilateral low back pain with right-sided sciatica     Problem List Patient Active Problem List   Diagnosis Date Noted  . Right leg pain 07/24/2016  . Right leg swelling 07/24/2016  . Paresthesia 06/12/2016  . 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 GASTRITIS, ACUTE 05/26/2007  . 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 GALLBLADDER DISEASE 03/11/2007    Earlie Counts, PT 12/15/16 3:38 PM   [facility] [address], [place], [place], [address] Phone: [phone]   Fax:  [phone]  Name: [name] MRN: [id number] Date of Birth: [birth year]

## 2016-12-18 ENCOUNTER — Encounter: Payer: Self-pay | Admitting: Physical Therapy

## 2016-12-18 ENCOUNTER — Ambulatory Visit: Payer: 59 | Admitting: Physical Therapy

## 2016-12-18 DIAGNOSIS — M5441 Lumbago with sciatica, right side: Secondary | ICD-10-CM | POA: Diagnosis not present

## 2016-12-18 NOTE — Patient Instructions (Addendum)
Abdominal Bracing With Pelvic Floor (Hook-Lying)    With neutral spine, tighten pelvic floor and abdominals. Hold 5 sec. Repeat __5_ times. Do __1_ times a day.   Copyright  VHI. All rights reserved.    Bracing With Knee Fallout (Hook-Lying)    With neutral spine, tighten pelvic floor and abdominals and hold. Alternating legs, drop knee out to side. Keep opposite hip still. Repeat _10__ times. Do _1__ times a day.   Copyright  VHI. All rights reserved.   Strengthening: Hip Abduction (Side-Lying)    Tighten muscles on front of left thigh, then lift leg __12__ inches from surface, keeping knee locked.  Repeat _10___ times per set. Do __1__ sets per session. Do _1___ sessions per day.  http://orth.exer.us/622   Copyright  VHI. All rights reserved.  Arm / Leg Lift: Opposite (Prone)    Lift right leg and opposite arm __2__ inches from floor, keeping knee locked. Repeat _10___ times per set. Do __1__ sets per session. Do _1___ sessions per day.  http://orth.exer.us/100   Copyright  VHI. All rights reserved.   Side Pull: Double Arm    On back, knees bent, feet flat. Arms perpendicular to body, shoulder level, elbows straight but relaxed. Pull arms out to sides, elbows straight. Resistance band comes across collarbones, hands toward floor. Hold momentarily. Slowly return to starting position. Repeat _10__ times. Band color __yellow___    Copyright  VHI. All rights reserved.  Over Head Pull: Wide Grip    On back, knees bent, feet flat, band across thighs, elbows straight but relaxed. Pull hands apart (start). Keeping elbows straight, bring arms up and over head, hands toward floor. Keep steady pull on band. Hold momentarily. Return slowly, keeping pull steady, back to start. Repeat _10__ times. Band color _yellow_____    Copyright  VHI. All rights reserved.   Sash    On back, knees bent, feet flat, left hand on left hip, right hand above left. Pull right arm  DIAGONALLY (hip to shoulder) across chest. Bring right arm along head toward floor. Hold momentarily. Slowly return to starting position. Repeat 10___ times. Do with left arm. Band color _yellow_____   Copyright  VHI. All rights reserved.  [facility] [address] [place], [place] [address] Phone # [phone] Fax [phone]

## 2016-12-18 NOTE — Therapy (Signed)
[facility] [address] [place], [place], [address] Phone: [phone]   Fax:  [phone]  Physical Therapy Treatment  Patient Details  Name: [name] MRN: [id number] Date of Birth: [birth year] Referring Provider: Dr. Latanya Maudlin  Encounter Date: 12/18/2016      PT End of Session - 12/18/16 1405    Visit Number 3   Date for PT Re-Evaluation 01/07/17   Authorization Type UHC   Authorization - Visit Number 3   Authorization - Number of Visits 60   PT Start Time 1400   PT Stop Time 1440   PT Time Calculation (min) 40 min   Activity Tolerance Patient tolerated treatment well   Behavior During Therapy [facility] for tasks assessed/performed      Past Medical History:  Diagnosis Date  . Anxiety    very high  . GERD (gastroesophageal reflux disease)   . Heart murmur    Pt saw Cardiologist when she was in her 60s, not since.  . Neuropathy 2016   in right leg  . Numbness and tingling   . Osteoporosis     Past Surgical History:  Procedure Laterality Date  . CESAREAN SECTION    . SHOULDER ARTHROSCOPY WITH SUBACROMIAL DECOMPRESSION AND OPEN ROTATOR C Right 12/09/2013   Procedure: RIGHT SHOULDER ARTHROSCOPY WITH SUBACROMIAL DECOMPRESSION AND MINI OPEN ROTATOR CUFF REPAIR, AND BICEPS TENDODESIS;  Surgeon: Augustin Schooling, MD;  Location: [place];  Service: Orthopedics;  Laterality: Right;  . TONSILLECTOMY      There were no vitals filed for this visit.      Subjective Assessment - 12/18/16 1401    Subjective I am feeling okay.  I still have pain.    Limitations Sitting;Standing   How long can you sit comfortably? sometimes bothers her   How long can you stand comfortably? sometimes hurts her   Patient Stated Goals learn sleeping postion; learning ways to manage back pain;    Currently in Pain? Yes   Pain Score 6    Pain Location Back   Pain Orientation Lower;Mid   Pain Descriptors / Indicators  Aching;Numbness   Pain Type Chronic pain   Pain Radiating Towards a numbness on left T5-T8   Pain Onset More than a month ago   Pain Frequency Intermittent   Aggravating Factors  getting up in the morning sleeping; bending forward; laying flat   Pain Relieving Factors leaning back on foam cushion; lay in fetal position   Multiple Pain Sites No                         [facility] Adult PT Treatment/Exercise - 12/18/16 0001      Lumbar Exercises: Aerobic   Stationary Bike nustep 6 min; seat #7; arm #10                PT Education - 12/18/16 1439    Education provided Yes   Education Details core strength and back strength   Person(s) Educated Patient   Methods Explanation;Demonstration;Verbal cues   Comprehension Verbalized understanding;Returned demonstration             PT Long Term Goals - 12/18/16 1442      PT LONG TERM GOAL #1   Title understand correct ways to sleep to not strain her back   Period Weeks   Status Achieved     PT LONG TERM GOAL #2   Title understand how to perform  correct body mechanics with daily tasks to not strain back and right knee   Time 4   Period Weeks   Status Achieved     PT LONG TERM GOAL #3   Title independent with HEP for back strengthening for at home and at the gym    Baseline still learning   Time 4   Period Weeks   Status On-going     PT LONG TERM GOAL #4   Title independent with stretching program that will not flare up her back   Baseline still learning   Time 4   Period Weeks   Status On-going     PT LONG TERM GOAL #5   Title understand information on osteoporosis, postures to avoid, ways to manage   Baseline still learning   Time 4   Period Weeks   Status On-going               Plan - 12/18/16 1400    Clinical Impression Statement Patient has back pain with transitional movement.  Patient is learning core exercises and stretches to improve stability with daily tasks.  Patient will  benefit from skilled therapy to reduce apin, education on correct ways to protect her spine and exercise.    Rehab Potential Excellent   Clinical Impairments Affecting Rehab Potential osteoporosis; Scoliosis; nueropathy in right leg; MD said to not fully flex right knee due to nerve compression   PT Frequency 2x / week   PT Duration 4 weeks   PT Treatment/Interventions Cryotherapy;Electrical Stimulation;Moist Heat;Therapeutic activities;Ultrasound;Therapeutic exercise;Neuromuscular re-education;Patient/family education;Manual techniques;Dry needling;Energy conservation   PT Next Visit Plan soft tissue work to elongate back muscles with stretches   PT Home Exercise Plan progress as needed   Consulted and Agree with Plan of Care Patient      Patient will benefit from skilled therapeutic intervention in order to improve the following deficits and impairments:  Pain, Decreased strength, Decreased mobility, Decreased activity tolerance  Visit Diagnosis: Acute bilateral low back pain with right-sided sciatica     Problem List Patient Active Problem List   Diagnosis Date Noted  . Right leg pain 07/24/2016  . Right leg swelling 07/24/2016  . Paresthesia 06/12/2016  . 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 GASTRITIS, ACUTE 05/26/2007  . 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 GALLBLADDER DISEASE 03/11/2007    Earlie Counts, PT 12/18/16 2:44 PM    [facility] [address] [place], [place], [address] Phone: [phone]   Fax:  [phone]  Name: [name] MRN: [id number] Date of Birth: 06-07-1955

## 2016-12-22 ENCOUNTER — Encounter: Payer: 59 | Admitting: Physical Therapy

## 2016-12-24 ENCOUNTER — Encounter: Payer: 59 | Admitting: Physical Therapy

## 2016-12-29 ENCOUNTER — Ambulatory Visit: Payer: 59 | Admitting: Physical Therapy

## 2016-12-29 ENCOUNTER — Encounter: Payer: Self-pay | Admitting: Physical Therapy

## 2016-12-29 DIAGNOSIS — M5441 Lumbago with sciatica, right side: Secondary | ICD-10-CM | POA: Diagnosis not present

## 2016-12-29 NOTE — Therapy (Signed)
[facility] [address], [place], [place], [address] Phone: [phone]   Fax:  [phone]  Physical Therapy Treatment  Patient Details  Name: [name] MRN: [id number] Date of Birth: [birth year] Referring Provider: Dr. Latanya Maudlin  Encounter Date: 12/29/2016      PT End of Session - 12/29/16 1525    Visit Number 4   Date for PT Re-Evaluation 02/04/17   Authorization Type UHC   Authorization - Visit Number 4   Authorization - Number of Visits 60   PT Start Time 5361   PT Stop Time 1525   PT Time Calculation (min) 40 min   Activity Tolerance Patient tolerated treatment well   Behavior During Therapy [facility] for tasks assessed/performed      Past Medical History:  Diagnosis Date  . Anxiety    very high  . GERD (gastroesophageal reflux disease)   . Heart murmur    Pt saw Cardiologist when she was in her 44s, not since.  . Neuropathy 2016   in right leg  . Numbness and tingling   . Osteoporosis     Past Surgical History:  Procedure Laterality Date  . CESAREAN SECTION    . SHOULDER ARTHROSCOPY WITH SUBACROMIAL DECOMPRESSION AND OPEN ROTATOR C Right 12/09/2013   Procedure: RIGHT SHOULDER ARTHROSCOPY WITH SUBACROMIAL DECOMPRESSION AND MINI OPEN ROTATOR CUFF REPAIR, AND BICEPS TENDODESIS;  Surgeon: Augustin Schooling, MD;  Location: [place];  Service: Orthopedics;  Laterality: Right;  . TONSILLECTOMY      There were no vitals filed for this visit.      Subjective Assessment - 12/29/16 1447    Subjective In the morning and after exercise I have pain. lift alternate shoulder and leg in prone hurts.    Limitations Sitting;Standing   How long can you sit comfortably? sometimes bothers her   How long can you stand comfortably? sometimes hurts her   Patient Stated Goals learn sleeping postion; learning ways to manage back pain;    Currently in Pain? Yes   Pain Score 5    Pain Location Back   Pain Orientation  Lower;Mid   Pain Descriptors / Indicators Aching;Numbness   Pain Type Chronic pain   Pain Radiating Towards a numbness on left T5-T8   Pain Frequency Intermittent   Aggravating Factors  getting up in the morning sleeping; bending forward; laying flat   Pain Relieving Factors leaning back on foam cushion; lay in fetal position            [facility] PT Assessment - 12/29/16 0001      Assessment   Medical Diagnosis M41.25 Other Idiopathic scoliosis, thoracolumbar region; M51.36 Degenerative Lumbar disc   Referring Provider Dr. Latanya Maudlin   Onset Date/Surgical Date 06/10/16   Prior Therapy yes     Precautions   Precautions Other (comment)   Precaution Comments osteoporosis, no bending right knee fully      Restrictions   Weight Bearing Restrictions No     Balance Screen   Has the patient fallen in the past 6 months No   Has the patient had a decrease in activity level because of a fear of falling?  No   Is the patient reluctant to leave their home because of a fear of falling?  No     Home Ecologist residence     Prior Function   Level of Independence Independent   Vocation Part time employment  one  day per week    Vocation Requirements sitting   Leisure gym 1 time per week; elliptical; bicycle     Cognition   Overall Cognitive Status Within Functional Limits for tasks assessed     Posture/Postural Control   Posture/Postural Control Postural limitations   Postural Limitations Forward head;Rounded Shoulders   Posture Comments scoliosis; left ilium his higher than the right     AROM   Lumbar Extension decreased by 25%   Lumbar - Left Side Bend decreased by 25%     Strength   Right Hip ABduction 4+/5   Left Hip Extension 4+/5   Left Hip ABduction 4-/5                     [facility] Adult PT Treatment/Exercise - 12/29/16 0001      Manual Therapy   Manual Therapy Soft tissue mobilization   Soft tissue mobilization thoracic  and lumbar paraspinals in prone                PT Education - 12/29/16 1617    Education provided Yes   Education Details increased reps for past HEP; changed prone lift extremities to lean on counter; wall squats and advanced abdominal exercise   Person(s) Educated Patient   Methods Explanation;Demonstration;Verbal cues;Handout   Comprehension Verbalized understanding;Returned demonstration             PT Long Term Goals - 12/29/16 1614      PT LONG TERM GOAL #1   Title understand correct ways to sleep to not strain her back   Time 4   Period Weeks   Status Achieved     PT LONG TERM GOAL #2   Title understand how to perform correct body mechanics with daily tasks to not strain back and right knee   Time 4   Period Weeks   Status Achieved     PT LONG TERM GOAL #3   Title independent with HEP for back strengthening for at home and at the gym    Baseline still learning   Time 4   Period Weeks   Status On-going     PT LONG TERM GOAL #4   Title independent with stretching program that will not flare up her back   Baseline still learning   Time 4   Period Weeks   Status On-going     PT LONG TERM GOAL #5   Title understand information on osteoporosis, postures to avoid, ways to manage   Baseline still learning   Time 4   Period Weeks   Status On-going     Additional Long Term Goals   Additional Long Term Goals Yes     PT LONG TERM GOAL #6   Title back pain with transitional movements decreased to minimal due to her being able to stabilize her core correctly   Time 4   Period Weeks   Status New               Plan - 12/29/16 1453    Clinical Impression Statement Patient has learned HEP that will exercise her core, abdominals, hips with abdominal bracing.  Patient has pain with transitional movements in laying down and going sit to stand. Patient has increased hip strength with left weaker than right.  Patient continues to have pain in the L5-S1  junction and T10-L1 area. Patient has good education on osteoporosis and how to protect their spine. Patient has tightness in thoracic lumbar muscles. Patient  will benefit from skilled therapy to increase core stabilization and reduce pain with transitional movements.        Rehab Potential Excellent   Clinical Impairments Affecting Rehab Potential osteoporosis; Scoliosis; nueropathy in right leg; MD said to not fully flex right knee due to nerve compression   PT Frequency 1x / week   PT Duration 4 weeks   PT Treatment/Interventions Cryotherapy;Electrical Stimulation;Moist Heat;Therapeutic activities;Ultrasound;Therapeutic exercise;Neuromuscular re-education;Patient/family education;Manual techniques;Dry needling;Energy conservation   PT Next Visit Plan soft tissue work to elongate back muscles with stretches; work transitional movements and standing exercise   PT Home Exercise Plan progress as needed   Recommended Other Services MD signed initial summary; sent MD renewal note   Consulted and Agree with Plan of Care Patient      Patient will benefit from skilled therapeutic intervention in order to improve the following deficits and impairments:  Pain, Decreased strength, Decreased mobility, Decreased activity tolerance  Visit Diagnosis: Acute bilateral low back pain with right-sided sciatica - Plan: PT plan of care cert/re-cert     Problem List Patient Active Problem List   Diagnosis Date Noted  . Right leg pain 07/24/2016  . Right leg swelling 07/24/2016  . Paresthesia 06/12/2016  . 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 GASTRITIS, ACUTE 05/26/2007  . 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 GALLBLADDER DISEASE 03/11/2007    Earlie Counts, PT 12/29/16 4:21 PM   [facility] [address] [place], [place], [address] Phone: [phone]   Fax:  [phone]  Name: [name] MRN: [id number] Date of Birth:  [birth year]

## 2016-12-29 NOTE — Patient Instructions (Addendum)
   Go on your forearms. Lift one arm and the opposing leg, keeping your core position steady and no arching. As you switch to the opposite arm and leg, keep your core steady and do not arch the back as you switch. Keep your body weight forward so that your shoulders are over your wrists, not behind them. 10x times each way.   Abdominal Bracing With Pelvic Floor (Hook-Lying)    With neutral spine, tighten pelvic floor and abdominals. Hold 5 sec. Repeat __5_ times. Do __1_ times a day.   Copyright  VHI. All rights reserved.    Bracing With Knee Fallout (Hook-Lying)    With neutral spine, tighten pelvic floor and abdominals and hold. Alternating legs, drop knee out to side. Keep opposite hip still. Repeat _10__ times. Do _1__ times a day.    Bracing With Leg March (Hook-Lying)    With neutral spine, tighten pelvic floor and abdominals and hold. Alternating legs, lift foot _12__ inches and return to floor. Repeat __15_ times. Do _1__ times a day. Go slowly so really working on control.   Copyright  VHI. All rights reserved.   Bracing With Heel Slides (Supine)    With neutral spine, tighten pelvic floor and abdominals and hold. Alternating legs, slide heel to bottom. Repeat _10__ times. Do _1__ times a day.   Copyright  VHI. All rights reserved.  Strengthening: Hip Abduction (Side-Lying)    Tighten muscles on front of left thigh, then lift leg __12__ inches from surface, keeping knee locked. Lead with heel.  Repeat _10___ times per set. Do __2__ sets per session. Do _1___ sessions per day.  http://orth.exer.us/622   Side Pull: Double Arm    On back, knees bent, feet flat. Arms perpendicular to body, shoulder level, elbows straight but relaxed. Pull arms out to sides, elbows straight. Resistance band comes across collarbones, hands toward floor. Hold momentarily. Slowly return to starting position. Repeat _10 x 2__ times. Band color __yellow___    Copyright  VHI.  All rights reserved.  Over Head Pull: Wide Grip    On back, knees bent, feet flat, band across thighs, elbows straight but relaxed. Pull hands apart (start). Keeping elbows straight, bring arms up and over head, hands toward floor. Keep steady pull on band. Hold momentarily. Return slowly, keeping pull steady, back to start. Repeat _10x2__ times. Band color _yellow_____    Copyright  VHI. All rights reserved.   Sash    On back, knees bent, feet flat, left hand on left hip, right hand above left. Pull right arm DIAGONALLY (hip to shoulder) across chest. Bring right arm along head toward floor. Hold momentarily. Slowly return to starting position. Repeat 10_x2__ times. Do with left arm. Band color _yellow_____   Copyright  VHI. All rights reserved.   Wall Slide    Stand with back to wall, feet 12 - 18 inches away, knees and feet apart, toes pointed slightly outward. Slide down wall by bending knees. Keep knees in alignment with toes. Hold maximum position __5_ seconds. Return to start by pushing with knees. No repeat. 10x   Copyright  VHI. All rights reserved.  [facility] [name] [place], [place] [address] Phone # [phone] Fax [phone]

## 2016-12-31 ENCOUNTER — Encounter: Payer: 59 | Admitting: Physical Therapy

## 2017-01-07 ENCOUNTER — Encounter: Payer: Self-pay | Admitting: Physical Therapy

## 2017-01-07 ENCOUNTER — Ambulatory Visit: Payer: 59 | Admitting: Physical Therapy

## 2017-01-07 DIAGNOSIS — M5441 Lumbago with sciatica, right side: Secondary | ICD-10-CM | POA: Diagnosis not present

## 2017-01-07 NOTE — Therapy (Signed)
[facility] [address], [place], [place], [address] Phone: [phone]   Fax:  [phone]  Physical Therapy Treatment  Patient Details  Name: [name] MRN: [id number] Date of Birth: 1955/08/03 Referring Provider: Dr. Latanya Maudlin  Encounter Date: 01/07/2017      PT End of Session - 01/07/17 1537    Visit Number 4   Date for PT Re-Evaluation 02/04/17   Authorization Type UHC   Authorization - Visit Number 5   Authorization - Number of Visits 55   PT Start Time 3664   PT Stop Time 1615   PT Time Calculation (min) 40 min   Activity Tolerance Patient tolerated treatment well   Behavior During Therapy [facility] for tasks assessed/performed      Past Medical History:  Diagnosis Date  . Anxiety    very high  . GERD (gastroesophageal reflux disease)   . Heart murmur    Pt saw Cardiologist when she was in her 61s, not since.  . Neuropathy 2016   in right leg  . Numbness and tingling   . Osteoporosis     Past Surgical History:  Procedure Laterality Date  . CESAREAN SECTION    . SHOULDER ARTHROSCOPY WITH SUBACROMIAL DECOMPRESSION AND OPEN ROTATOR C Right 12/09/2013   Procedure: RIGHT SHOULDER ARTHROSCOPY WITH SUBACROMIAL DECOMPRESSION AND MINI OPEN ROTATOR CUFF REPAIR, AND BICEPS TENDODESIS;  Surgeon: Augustin Schooling, MD;  Location: [place];  Service: Orthopedics;  Laterality: Right;  . TONSILLECTOMY      There were no vitals filed for this visit.      Subjective Assessment - 01/07/17 1535    Subjective I may have less pain at times.    Limitations Sitting;Standing   How long can you sit comfortably? sometimes bothers her   How long can you stand comfortably? sometimes hurts her   Patient Stated Goals learn sleeping postion; learning ways to manage back pain;    Currently in Pain? Yes   Pain Score 4    Pain Location Back   Pain Orientation Lower;Mid   Pain Descriptors / Indicators Aching;Numbness    Pain Type Chronic pain   Pain Onset More than a month ago   Pain Frequency Intermittent   Aggravating Factors  getting up in the morning sleeping; bending forward; laying flat   Pain Relieving Factors leaning back on foam cusion; lay in fetal position   Multiple Pain Sites No                         [facility] Adult PT Treatment/Exercise - 01/07/17 0001      Lumbar Exercises: Aerobic   Stationary Bike nustep 6 min; seat #7; arm #10 level 3     Knee/Hip Exercises: Stretches   Gastroc Stretch 1 rep;Left;Right;30 seconds   Gastroc Stretch Limitations lean against wall                PT Education - 01/07/17 1631    Education provided Yes   Education Details information on spinomed brace, using walking sticks to elongate spine, weighted vest to improve bone density; back strengthening and stretching in standing   Person(s) Educated Patient   Methods Explanation;Demonstration;Verbal cues;Handout   Comprehension Returned demonstration;Verbalized understanding             PT Long Term Goals - 01/07/17 1625      PT LONG TERM GOAL #3   Title independent with HEP for back  strengthening for at home and at the gym    Time 4   Period Weeks   Status On-going     PT LONG TERM GOAL #4   Title independent with stretching program that will not flare up her back   Baseline still learning   Time 4   Period Weeks   Status On-going     PT LONG TERM GOAL #5   Title understand information on osteoporosis, postures to avoid, ways to manage   Baseline still learning   Time 4   Period Weeks   Status On-going     PT LONG TERM GOAL #6   Title back pain with transitional movements decreased to minimal due to her being able to stabilize her core correctly   Time 4   Period Weeks   Status On-going               Plan - 01/07/17 1536    Clinical Impression Statement Patient was educated on ways to progress her exercise program to work on bone strength and  postural strength. Patient understands how a brace could be beneficial. Patient will benefit from skilled therapy to increase core stabilization and reduce pain with transitional movments.    Rehab Potential Excellent   Clinical Impairments Affecting Rehab Potential osteoporosis; Scoliosis; nueropathy in right leg; MD said to not fully flex right knee due to nerve compression   PT Frequency 1x / week   PT Duration 4 weeks   PT Treatment/Interventions Cryotherapy;Electrical Stimulation;Moist Heat;Therapeutic activities;Ultrasound;Therapeutic exercise;Neuromuscular re-education;Patient/family education;Manual techniques;Dry needling;Energy conservation   PT Next Visit Plan soft tissue work to elongate back muscles with stretches; work transitional movements and standing exercise; continue or not   PT Home Exercise Plan progress as needed   Recommended Other Services MD signed the renewal summary   Consulted and Agree with Plan of Care Patient      Patient will benefit from skilled therapeutic intervention in order to improve the following deficits and impairments:  Pain, Decreased strength, Decreased mobility, Decreased activity tolerance  Visit Diagnosis: Acute bilateral low back pain with right-sided sciatica     Problem List Patient Active Problem List   Diagnosis Date Noted  . Right leg pain 07/24/2016  . Right leg swelling 07/24/2016  . Paresthesia 06/12/2016  . 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 GASTRITIS, ACUTE 05/26/2007  . 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 GALLBLADDER DISEASE 03/11/2007    Earlie Counts, PT 01/07/17 4:32 PM    [facility] [address], [place], [place], [address] Phone: [phone]   Fax:  [phone]  Name: [name] MRN: [id number] Date of Birth: 1955-11-22

## 2017-01-07 NOTE — Patient Instructions (Addendum)
Corner Push-A-Way    Face corner. Hands on walls, shoulder height, fingers pointed up. Keep body straight (like a board), elbows up; lean into corner. Keep heels on floor. Hold _1__ seconds. Push away with arms. Repeat __15_ times.  Copyright  VHI. All rights reserved.  Calf Stretch    Hands on wall, shoulder height, slightly wider than shoulders, fingers up. Left foot ahead of right. Body straight (like a board); lean into wall by bending front knee. Keep right heel on floor and foot straight ahead. Hold _30__ seconds. Push away with arms. Repeat _2__ times. Do on other leg.  Copyright  VHI. All rights reserved.  Hip / Back Re-Aligner    Stand in good body alignment. Hands on waist, thumbs forward. Squeeze buttocks; press hips forward over knees. Hold __1_ seconds. Relax hips. Do not bend knees. Repeat _5__ times. Do whenever sitting for a long time or doing a lot of activities in front of you.   Copyright  VHI. All rights reserved.  Off the Wall    Face wall, toes touching wall. Slide fingers up wall so arms are as close to head as possible, elbows straight. Head and body still.  Lift both arms off wall. Hold _1__ seconds. Relax both arms. Do not arch lower back. Repeat entire sequence __10_ times.  Copyright  VHI. All rights reserved.  Shoulder Blade Squeeze    Stand in good body alignment. Interlace fingers behind sacrum. Squeeze backbone with shoulder blades. Keep body still, move only shoulder blades. Keep hands close to body. Hold _3__ seconds. Relax shoulders. Do not shrug shoulders. Repeat _2__ times. Do when sitting in a car; sitting to do paperwork; when slumped over for awhile Copyright  VHI. All rights reserved.  Tandem Stance    Right foot in front of left, heel touching toe both feet "straight ahead". Stand on Foot Triangle of Support with both feet. Balance in this position __30_ seconds. Do with left foot in front of right.2 times each way.    Copyright  VHI. All rights reserved.  Balance: One-Leg    Stand on left leg on Foot Triangle of Support. Use hand supports. Let go with left hand, then right hand. Support as little as possible but as much as needed. Stand _10__ seconds. Repeat, standing on right leg. 2 times each side.   Copyright  VHI. All rights reserved.  Walk Taller    Pretend that you are 2-4 inches taller than you think or have been told you are. "Attach" a string, rope, golden thread, or steel cable to the top of your head to help pull you up. Pretend that you are a puppet being held up by this string.   Copyright  VHI. All rights reserved.  Walking with weighted vest that is no more than 8-12 pounds that distributed throughout your trunk and adjustable.   Walking sticks.   Spinomed brace - [employer] is a Customer service manager to have it   [facility] [address] [place], [place] [address] Phone # [phone] Fax [phone]

## 2017-01-15 ENCOUNTER — Ambulatory Visit: Payer: 59 | Attending: Orthopedic Surgery | Admitting: Physical Therapy

## 2017-01-15 ENCOUNTER — Encounter: Payer: Self-pay | Admitting: Physical Therapy

## 2017-01-15 DIAGNOSIS — M5441 Lumbago with sciatica, right side: Secondary | ICD-10-CM | POA: Diagnosis not present

## 2017-01-15 NOTE — Patient Instructions (Addendum)
Off the Wall    Face wall, toes touching wall. Slide fingers up wall so one arm are as close to head as possible, elbows straight. Head and body still.  Lift both arms off wall. Hold _1__ seconds. Relax both arms.Alternate arms.  Do not arch lower back. Repeat entire sequence __10_ times.  Copyright  VHI. All rights reserved.  Tandem Stance    Right foot in front of left, heel touching toe both feet "straight ahead". Stand on Foot Triangle of Support with both feet. Balance in this position __30_ seconds. Do with left foot in front of right.2 times each way.  Progress with closing eyes or with arm movements Progression is to walk a line on tile then progress then walk on carpet Copyright  VHI. All rights reserved.   Balance: One-Leg    Stand on left leg on Foot Triangle of Support. Use hand supports. Let go with left hand, then right hand. Support as little as possible but as much as needed. Stand _10__ seconds. Repeat, standing on right leg. 2 times each side.  Progress is to close eyes, change surface you are standing on, arm movements Copyright  VHI. All rights reserved.   Arm band exercises use the red to progress and use in standing/sitting.  Only do the movements for the exercise where there is no pain.  Wall pushups- do with arms out to side then keep elbows close to trunk. Progress wall pushup by bringing you feet further from the wall.   Home TENS unit.   TENS 7000 2nd Edition Digital TENS Unit with     Can order from [facility].  You will need electrodes to place the TENS unit on.   Put on for 45 min. Can use again after 45 min.    [facility] [address] [place], [place] [address] Phone # [phone] Fax [phone]

## 2017-01-15 NOTE — Therapy (Signed)
[facility] [address], [place], [place], [address] Phone: [phone]   Fax:  [phone]  Physical Therapy Treatment  Patient Details  Name: [name] MRN: [id number] Date of Birth: Dec 19, 1955 Referring Provider: Dr. Latanya Maudlin   Encounter Date: 01/15/2017  PT End of Session - 01/15/17 1537    Visit Number  6    Date for PT Re-Evaluation  02/04/17    Authorization Type  UHC    Authorization - Visit Number  6    Authorization - Number of Visits  60    PT Start Time  0947    PT Stop Time  1615    PT Time Calculation (min)  45 min    Activity Tolerance  Patient tolerated treatment well    Behavior During Therapy  [facility] for tasks assessed/performed       Past Medical History:  Diagnosis Date  . Anxiety    very high  . GERD (gastroesophageal reflux disease)   . Heart murmur    Pt saw Cardiologist when she was in her 75s, not since.  . Neuropathy 2016   in right leg  . Numbness and tingling   . Osteoporosis     Past Surgical History:  Procedure Laterality Date  . CESAREAN SECTION    . TONSILLECTOMY      There were no vitals filed for this visit.  Subjective Assessment - 01/15/17 1533    Subjective  I am feeling better.  My pain is down to a 2/10. I will like to have the Spinomed brace to help me with the back pain.     Limitations  Sitting;Standing    How long can you sit comfortably?  sometimes bothers her    How long can you stand comfortably?  sometimes hurts her    Patient Stated Goals  learn sleeping postion; learning ways to manage back pain;     Currently in Pain?  Yes    Pain Score  2     Pain Location  Back    Pain Orientation  Lower;Mid    Pain Descriptors / Indicators  Aching;Numbness    Pain Type  Chronic pain    Pain Radiating Towards  a numbness on left T5-T8    Pain Onset  More than a month ago    Pain Frequency  Intermittent    Aggravating Factors   gitting up in the  morning,     Pain Relieving Factors  leaning back on foam cushion; lay in fetal position    Multiple Pain Sites  No         [facility] PT Assessment - 01/15/17 0001      Assessment   Medical Diagnosis  M41.25 Other Idiopathic scoliosis, thoracolumbar region; M51.36 Degenerative Lumbar disc    Onset Date/Surgical Date  06/10/16    Prior Therapy  yes      Precautions   Precautions  Other (comment)    Precaution Comments  osteoporosis, no bending right knee fully       Restrictions   Weight Bearing Restrictions  No      Home Environment   Living Environment  Private residence      Prior Function   Level of Independence  Independent    Vocation  Part time employment one day per week     Vocation Requirements  sitting    Leisure  gym 1 time per week; elliptical; bicycle      Cognition  Overall Cognitive Status  Within Functional Limits for tasks assessed      Posture/Postural Control   Posture/Postural Control  Postural limitations    Postural Limitations  Forward head;Rounded Shoulders    Posture Comments  scoliosis; left ilium his higher than the right      AROM   Lumbar Extension  decreased by 25%    Lumbar - Left Side Bend  decreased by 25%      Strength   Right Hip ABduction  4+/5    Left Hip Extension  4+/5    Left Hip ABduction  4-/5                  [facility] Adult PT Treatment/Exercise - 01/15/17 0001      Modalities   Modalities  Electrical Stimulation;Moist Heat      Moist Heat Therapy   Number Minutes Moist Heat  15 Minutes    Moist Heat Location  Lumbar Spine      Electrical Stimulation   Electrical Stimulation Location  lumbar spine supine    Electrical Stimulation Action  IFC    Electrical Stimulation Parameters  to patient tolerance, 15 min    Electrical Stimulation Goals  Pain             PT Education - 01/15/17 1706    Education provided  Yes    Education Details  how to progress her exercises; home tens unit and where to place  the electodes and where to purchase one; discussed the Spinomed brace and how it can help her, how to progress her balance exercises.     Person(s) Educated  Patient    Methods  Demonstration;Verbal cues;Handout    Comprehension  Returned demonstration;Verbalized understanding          PT Long Term Goals - 01/15/17 1711      PT LONG TERM GOAL #1   Title  understand correct ways to sleep to not strain her back    Time  4    Period  Weeks    Status  Achieved      PT LONG TERM GOAL #2   Title  understand how to perform correct body mechanics with daily tasks to not strain back and right knee    Time  4    Period  Weeks    Status  Achieved      PT LONG TERM GOAL #3   Title  independent with HEP for back strengthening for at home and at the gym     Time  4    Period  Weeks    Status  Achieved      PT LONG TERM GOAL #4   Title  independent with stretching program that will not flare up her back    Time  4    Period  Weeks    Status  Achieved      PT LONG TERM GOAL #5   Title  understand information on osteoporosis, postures to avoid, ways to manage    Time  4    Period  Weeks    Status  Achieved      PT LONG TERM GOAL #6   Title  back pain with transitional movements decreased to minimal due to her being able to stabilize her core correctly    Time  4    Period  Weeks    Status  Achieved            Plan - 01/15/17  1537    Clinical Impression Statement  Patient has met her goals.  She understands how to progress her home program and ways to make it challenging.  Patient understands ways to manage her pain with spinomed brace, home tens unit.  Patient understands how to wear a weighted vest to increase bone mass with walking, use walking sticks to decresae strain on the spine.  Patient pain is better in the back region.  Patient understands how to use correct body mechanics with daily tasks to decrease strain on her spine. Patient is ready for discharge.     Rehab  Potential  Excellent    Clinical Impairments Affecting Rehab Potential  osteoporosis; Scoliosis; nueropathy in right leg; MD said to not fully flex right knee due to nerve compression    PT Treatment/Interventions  Cryotherapy;Electrical Stimulation;Moist Heat;Therapeutic activities;Ultrasound;Therapeutic exercise;Neuromuscular re-education;Patient/family education;Manual techniques;Dry needling;Energy conservation    PT Next Visit Plan  DIscharge to HEP. Patient would benefit from a Spinomed brace.  She will need a prescription and see [employer] in [place] to fulfill the presciption.     PT Home Exercise Plan  Current HEP    Recommended Other Services  Patient will benefit from the Spinomed brace to assist in posture and reduce compression of the spine    Consulted and Agree with Plan of Care  Patient       Patient will benefit from skilled therapeutic intervention in order to improve the following deficits and impairments:  Pain, Decreased strength, Decreased mobility, Decreased activity tolerance  Visit Diagnosis: Acute bilateral low back pain with right-sided sciatica     Problem List Patient Active Problem List   Diagnosis Date Noted  . Right leg pain 07/24/2016  . Right leg swelling 07/24/2016  . Paresthesia 06/12/2016  . 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 GASTRITIS, ACUTE 05/26/2007  . 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 GALLBLADDER DISEASE 03/11/2007    Earlie Counts, PT 01/15/17 5:15 PM   [facility] [address], [place], [place], [address] Phone: [phone]   Fax:  [phone]  Name: MAYELA BULLARD MRN: [id number] Date of Birth: 31-May-1955  PHYSICAL THERAPY DISCHARGE SUMMARY  Visits from Start of Care: 6  Current functional level related to goals / functional outcomes: See above   Remaining deficits: See above   Education / Equipment: HEP; patient would benefit from a Spinomed  brace to assist in posture and decompression of her spine.  Plan: Patient agrees to discharge.  Patient goals were met. Patient is being discharged due to meeting the stated rehab goals.  Thank you for the referral. Earlie Counts, PT 01/15/17 5:14 PM  ?????

## 2017-01-26 ENCOUNTER — Encounter: Payer: Self-pay | Admitting: Neurology

## 2017-01-26 ENCOUNTER — Ambulatory Visit: Payer: Commercial Managed Care - HMO | Admitting: Neurology

## 2017-01-26 VITALS — BP 117/79 | HR 78 | Ht 64.0 in | Wt 140.0 lb

## 2017-01-26 DIAGNOSIS — M79604 Pain in right leg: Secondary | ICD-10-CM | POA: Diagnosis not present

## 2017-01-26 DIAGNOSIS — M792 Neuralgia and neuritis, unspecified: Secondary | ICD-10-CM | POA: Insufficient documentation

## 2017-01-26 MED ORDER — DULOXETINE HCL 60 MG PO CPEP: 60.0000 mg | ORAL_CAPSULE | Freq: Every day | ORAL | 12 refills | Status: DC

## 2017-01-26 MED ORDER — GABAPENTIN 300 MG PO CAPS: 300.0000 mg | ORAL_CAPSULE | Freq: Three times a day (TID) | ORAL | 11 refills | Status: DC

## 2017-01-26 MED ORDER — GABAPENTIN 300 MG PO CAPS: 300.0000 mg | ORAL_CAPSULE | Freq: Three times a day (TID) | ORAL | 4 refills | Status: DC

## 2017-01-26 NOTE — Progress Notes (Signed)
PATIENT: [name] DOB: [birth year]  Chief Complaint  Patient presents with  . Follow-up    PCP: Dr. Darcus Austin. Patient says that she has good days and bad days, she's kept a calendar of events.     HISTORICAL  [name] is a 61 years old right-handed female, seen referred by her primary care physician Dr. Darcus Austin, for intermittent numbness tingling involving different areas, right worse than left.  She had a history of scoliosis, with chronic back pain, had right rotator cuff surgery in April 2015, she tends to sleep holding her right wrist flexion  Since April 2016, she reported frequent episodes of waking up in the middle of the night, felt numbness in her right hand, initially only involving right thumb, later she noticed numbness involving right first 3 fingers, getting better by change position, wrist splint has been really helpful  A month later, she also noticed intermittent numbness involving her right toe, sometimes extending into the ball of her right foot, she had a few episode of woke up notice right tongue numbness, short lasting,  No visual changes, she denies gait difficulty, she also complains of frequent awakening at night time,dry mouth, fatigue, sleepiness during the day  UPDATE June 12 2016; She was initially evaluated in 2016 for intermittent numbness involving right first 3 fingers,  right tongue, right foot,  Extensive evaluations failed to demonstrate etiology, in specific, there was no evidence of central nervous system etiology, no evidence of right cervical radiculopathy, or right upper extremity neuropathy   Her symptoms has much improved after she adjusting her hand position during sleep, she has mild low back pain, but since 2017, she was bothered by this intermittent set onset jolt of shooting sensation at right lateral leg, felt like somebody was tasering her, each episode only lasts a few seconds, there was no persistent weakness or  sensory loss,  Most severe episode was on April third, she had 4 episodes in one hour, each episode lasts about 15 seconds, severe sharp pain, in between episodes, she denies sensory loss, weakness, no low back pain, no gait abnormality.  MRI of the brain without contrast on September 13 2014 that was normal,  UPDATE Jul 24 2016: She was able to enjoy her [place] trip, taking gabapentin 300 mg 3 times a day which has helped her symptoms, she continue compliance of recurrent unpredictable set onset severe short pain at her right lateral leg, short lasting, no limitation in her functional afterwards, but during the painful spells," as if I have been tortured", which has caused significant psychological fear in her.  I have personally reviewed MRI of right lower extremity there was no significant abnormality noted.  Update Jan 26 2017: She still has intermittent right lower lateral leg pain, lasting for a few seconds, transient, but can have multiple episodes in the day, She was given steroid since Oct 2-13 2018,  She reduced gabapentin from 900 to 800mg  daily,   REVIEW OF SYSTEMS: Full 14 system review of systems performed and notable only for as above  ALLERGIES: No Known Allergies  HOME MEDICATIONS: Current Outpatient Prescriptions  Medication Sig Dispense Refill  . aspirin 81 MG chewable tablet Chew 81 mg by mouth daily.    . calcium carbonate (TUMS - DOSED IN MG ELEMENTAL CALCIUM) 500 MG chewable tablet Chew 1 tablet by mouth daily as needed for indigestion or heartburn.    . Cholecalciferol (VITAMIN D) 2000 UNITS CAPS Take 2,000 Units  by mouth daily.    . cycloSPORINE (RESTASIS) 0.05 % ophthalmic emulsion Place 1 drop into both eyes 2 (two) times daily.    . Omega-3 Fatty Acids (FISH OIL PO) Take 1 capsule by mouth daily.    Marland Kitchen VAGIFEM 10 MCG TABS vaginal tablet INSERT 1 TABLET VAGINALLY TWICE A WEEK  3     PAST MEDICAL HISTORY: Past Medical History:  Diagnosis Date  . Anxiety    very  high  . GERD (gastroesophageal reflux disease)   . Heart murmur    Pt saw Cardiologist when she was in her 93s, not since.  . Neuropathy 2016   in right leg  . Numbness and tingling   . Osteoporosis     PAST SURGICAL HISTORY: Past Surgical History:  Procedure Laterality Date  . CESAREAN SECTION    . RIGHT SHOULDER ARTHROSCOPY WITH SUBACROMIAL DECOMPRESSION AND MINI OPEN ROTATOR CUFF REPAIR, AND BICEPS TENDODESIS Right 12/09/2013   Performed by Augustin Schooling, MD at [facility]  . TONSILLECTOMY      FAMILY HISTORY: Family History  Problem Relation Age of Onset  . Arthritis Mother   . Hyperlipidemia Mother   . Skin cancer Father   . Depression Father   . Parkinson's disease Father     SOCIAL HISTORY:  Social History   Socioeconomic History  . Marital status: Married    Spouse name: Not on file  . Number of children: 2  . Years of education: Masters  . Highest education level: Not on file  Social Needs  . Financial resource strain: Not on file  . Food insecurity - worry: Not on file  . Food insecurity - inability: Not on file  . Transportation needs - medical: Not on file  . Transportation needs - non-medical: Not on file  Occupational History  . Occupation: Management/Librarian  Tobacco Use  . Smoking status: Never Smoker  . Smokeless tobacco: Never Used  Substance and Sexual Activity  . Alcohol use: Yes    Comment: rarely  . Drug use: No  . Sexual activity: Not on file  Other Topics Concern  . Not on file  Social History Narrative   Lives at home with her husband.   Right-handed.   6-8 cups caffeine per day.     PHYSICAL EXAM   Vitals:   01/26/17 1034  BP: 117/79  Pulse: 78  Weight: 140 lb (63.5 kg)  Height: 5\' 4"  (1.626 m)    Not recorded      Body mass index is 24.03 kg/m.  PHYSICAL EXAMNIATION:  Gen: NAD, conversant, well nourised, obese, well groomed                     Cardiovascular: Regular rate rhythm, no peripheral edema, warm,  nontender. Eyes: Conjunctivae clear without exudates or hemorrhage Neck: Supple, no carotid bruise. Pulmonary: Clear to auscultation bilaterally   NEUROLOGICAL EXAM:  MENTAL STATUS: Speech:    Speech is normal; fluent and spontaneous with normal comprehension.  Cognition:    The patient is oriented to person, place, and time;     recent and remote memory intact;     language fluent;     normal attention, concentration,     fund of knowledge.  CRANIAL NERVES: CN II: Visual fields are full to confrontation. Fundoscopic exam is normal with sharp discs and no vascular changes. Venous pulsations are present bilaterally. Pupils are 4 mm and briskly reactive to light.  CN III, IV, VI:  extraocular movement are normal. No ptosis. CN V: Facial sensation is intact to pinprick in all 3 divisions bilaterally. Corneal responses are intact.  CN VII: Face is symmetric with normal eye closure and smile. CN VIII: Hearing is normal to rubbing fingers CN IX, X: Palate elevates symmetrically. Phonation is normal. Small oropharyngeal  CN XI: Head turning and shoulder shrug are intact CN XII: Tongue is midline with normal movements and no atrophy.  MOTOR: There is no pronator drift of out-stretched arms. Muscle bulk and tone are normal. Muscle strength is normal.  REFLEXES: Reflexes are 2+ and symmetric at the biceps, triceps, knees, and ankles. Plantar responses are flexor.  SENSORY: Light touch, pinprick, position sense, and vibration sense are intact in fingers and toes.  COORDINATION: Rapid alternating movements and fine finger movements are intact. There is no dysmetria on finger-to-nose and heel-knee-shin. There are no abnormal or extraneous movements.   GAIT/STANCE: Posture is normal. Gait is steady with normal steps, base, arm swing, and turning. Heel and toe walking are normal. Tandem gait is normal.  Romberg is absent.   DIAGNOSTIC DATA (LABS, IMAGING, TESTING) - I reviewed patient  records, labs, notes, testing and imaging myself where available.  Lab Results  Component Value Date   WBC 9.1 09/26/2016   HGB 14.6 09/26/2016   HCT 43.4 09/26/2016   MCV 88.0 09/26/2016   PLT 277 09/26/2016      Component Value Date/Time   NA 140 09/26/2016 0802   K 3.6 09/26/2016 0802   CL 104 09/26/2016 0802   CO2 29 09/26/2016 0802   GLUCOSE 127 (H) 09/26/2016 0802   BUN 12 09/26/2016 0802   CREATININE 0.60 09/26/2016 0802   CALCIUM 8.4 (L) 09/26/2016 0802   PROT 6.9 09/26/2016 0802   ALBUMIN 4.2 09/26/2016 0802   AST 21 09/26/2016 0802   ALT 17 09/26/2016 0802   ALKPHOS 50 09/26/2016 0802   BILITOT 0.3 09/26/2016 0802   GFRNONAA >60 09/26/2016 0802   GFRAA >60 09/26/2016 0802    ASSESSMENT AND PLAN  [name] is a 61 y.o. female  Transient right lateral leg radiating pain  In the territory of right superficial peroneal nerve,  Extensive evaluation in the past, normal MRI of the brain, MRI of right lower extremity negative venous, Doppler study of right leg, normal EMG nerve conduction study  Keep on Cymbalta 60 mg daily  Gabapentin 300 mg as needed  Also perform nerve block for right superficial peroneal nerve today.  Marcial Pacas, M.D. Ph.D.  [facility] [address] [place], [place] [address] Ph: [phone] Fax: [phone]

## 2017-01-26 NOTE — Progress Notes (Signed)
   History:  She has few years history of intermittent unpredictable sharp radiating pain to right lateral lower leg, in the distribution of right superficial peroneal nerve, despite taking gabapentin 300 mg 3 times a day.  I performed nerve block to right superficial peroneal nerve today, 1 cc of 0.5 percent of Marcaine, mixed with 0.5 cc of betamethasone, the nerve block was performed at the right lower leg about 5 cm above right lateral malleolus.

## 2017-02-09 ENCOUNTER — Telehealth: Payer: Self-pay | Admitting: Neurology

## 2017-02-09 ENCOUNTER — Encounter: Payer: Self-pay | Admitting: *Deleted

## 2017-02-09 NOTE — Telephone Encounter (Signed)
Spoke to patient - she has been reducing her gabapentin 300mg  dose for the last two weeks by using her supply of 100mg  capsules she had at home.   She also reports never starting duloxetine 60mg .  She has been without pain and plans to continue to taper down her gabapentin dose to see if she can be off the medication completely.  She will keep [place] informed of her progress.  She has a pending follow up in six months.

## 2017-02-09 NOTE — Telephone Encounter (Signed)
Pt called she is wanting to taper off gabapentin (NEURONTIN) 300 MG capsule. Please call to advise

## 2017-02-27 DIAGNOSIS — H04123 Dry eye syndrome of bilateral lacrimal glands: Secondary | ICD-10-CM | POA: Diagnosis not present

## 2017-02-27 DIAGNOSIS — H401122 Primary open-angle glaucoma, left eye, moderate stage: Secondary | ICD-10-CM | POA: Diagnosis not present

## 2017-02-27 DIAGNOSIS — H40051 Ocular hypertension, right eye: Secondary | ICD-10-CM | POA: Diagnosis not present

## 2017-05-04 DIAGNOSIS — Z1231 Encounter for screening mammogram for malignant neoplasm of breast: Secondary | ICD-10-CM | POA: Diagnosis not present

## 2017-05-04 LAB — HM MAMMOGRAPHY

## 2017-05-11 DIAGNOSIS — E559 Vitamin D deficiency, unspecified: Secondary | ICD-10-CM | POA: Diagnosis not present

## 2017-05-11 DIAGNOSIS — M810 Age-related osteoporosis without current pathological fracture: Secondary | ICD-10-CM | POA: Diagnosis not present

## 2017-05-11 DIAGNOSIS — Z0000 Encounter for general adult medical examination without abnormal findings: Secondary | ICD-10-CM | POA: Diagnosis not present

## 2017-05-11 DIAGNOSIS — N952 Postmenopausal atrophic vaginitis: Secondary | ICD-10-CM | POA: Diagnosis not present

## 2017-05-11 DIAGNOSIS — Z23 Encounter for immunization: Secondary | ICD-10-CM | POA: Diagnosis not present

## 2017-05-11 DIAGNOSIS — Z136 Encounter for screening for cardiovascular disorders: Secondary | ICD-10-CM | POA: Diagnosis not present

## 2017-05-13 ENCOUNTER — Encounter: Payer: Self-pay | Admitting: Gastroenterology

## 2017-06-24 ENCOUNTER — Encounter: Payer: Self-pay | Admitting: Gastroenterology

## 2017-06-24 ENCOUNTER — Ambulatory Visit: Payer: 59 | Admitting: Gastroenterology

## 2017-06-24 VITALS — BP 102/64 | HR 76 | Ht 63.5 in | Wt 142.1 lb

## 2017-06-24 DIAGNOSIS — K219 Gastro-esophageal reflux disease without esophagitis: Secondary | ICD-10-CM | POA: Diagnosis not present

## 2017-06-24 DIAGNOSIS — Z1211 Encounter for screening for malignant neoplasm of colon: Secondary | ICD-10-CM

## 2017-06-24 DIAGNOSIS — Z1212 Encounter for screening for malignant neoplasm of rectum: Secondary | ICD-10-CM | POA: Diagnosis not present

## 2017-06-24 DIAGNOSIS — R0989 Other specified symptoms and signs involving the circulatory and respiratory systems: Secondary | ICD-10-CM

## 2017-06-24 DIAGNOSIS — F458 Other somatoform disorders: Secondary | ICD-10-CM

## 2017-06-24 NOTE — Progress Notes (Signed)
[name]    [id number]    1956/03/02  Primary Care Physician:Gates, Butch Penny, MD  Referring Physician: Darcus Austin, MD [place], [place] [address]  Chief complaint:  GERD  HPI: 62 year old female for complaints of worsening heartburn over the past few years.  She has been taking ranitidine daily at bedtime for 2 years and she feels it is not working as well anymore.  Has been getting heartburn and globus sensation in the upper chest, throat almost daily after dinner around 8 PM, somewhat better when she takes the ranitidine.  Does not matter what kind of meals she has.  Denies any dysphagia or odynophagia on regular basis.  Occasionally when she tries to eat fast or is in a hurry she feels the food go down really slow and can choke at times.  Drinks unsweetened tea about 6-7 glasses a day until 4 PM and avoids to drink any caffeinated drinks after that. Denies any nausea, vomiting, abdominal pain, melena, blood per rectum or change in bowel habits. Last colonoscopy in May, 2008 by Dr. Olevia Perches showed mild sigmoid diverticulosis otherwise normal exam.  She had fecal Hemoccult stool cards negative.  EGD April, 2009 for epigastric abdominal pain was a normal exam  Outpatient Encounter Medications as of 06/24/2017  Medication Sig  . alendronate (FOSAMAX) 70 MG tablet   . aspirin 81 MG chewable tablet Chew 81 mg by mouth as needed.   Marland Kitchen b complex vitamins capsule Take 1 capsule by mouth daily.  . Cholecalciferol (VITAMIN D) 2000 UNITS CAPS Take 2,000 Units by mouth daily.  . cycloSPORINE (RESTASIS) 0.05 % ophthalmic emulsion Place 1 drop into both eyes 2 (two) times daily.  Marland Kitchen gabapentin (NEURONTIN) 300 MG capsule Take 1 capsule (300 mg total) 3 (three) times daily by mouth. Please cancel previous 270 tablets prescription (Patient taking differently: Take 200 mg by mouth daily. Please cancel previous 270 tablets prescription)  . latanoprost (XALATAN)  0.005 % ophthalmic solution Place 1 drop into both eyes at bedtime.  . Magnesium 250 MG TABS Take 1 tablet by mouth daily.   . Omega-3 Fatty Acids (FISH OIL PO) Take 1 capsule by mouth daily.  . ranitidine (ZANTAC) 150 MG tablet Take 150 mg by mouth daily.  . [DISCONTINUED] diazepam (VALIUM) 5 MG tablet Half tablet every 8 hours as needed for vertigo  . [DISCONTINUED] meclizine (ANTIVERT) 25 MG tablet Take 1 tablet (25 mg total) by mouth 3 (three) times daily as needed for dizziness.  . [DISCONTINUED] ondansetron (ZOFRAN ODT) 4 MG disintegrating tablet Take 1 tablet (4 mg total) by mouth every 4 (four) hours as needed for nausea or vomiting.   No facility-administered encounter medications on file as of 06/24/2017.     Allergies as of 06/24/2017  . (No Known Allergies)    Past Medical History:  Diagnosis Date  . Anxiety    very high  . GERD (gastroesophageal reflux disease)   . Heart murmur    Pt saw Cardiologist when she was in her 67s, not since.  . IBS (irritable bowel syndrome)   . Neuropathy 2016   in right leg  . Numbness and tingling   . Osteoporosis   . Scoliosis     Past Surgical History:  Procedure Laterality Date  . CESAREAN SECTION  1988  . SHOULDER ARTHROSCOPY WITH SUBACROMIAL DECOMPRESSION AND OPEN ROTATOR C Right 12/09/2013   Procedure: RIGHT SHOULDER ARTHROSCOPY WITH  SUBACROMIAL DECOMPRESSION AND MINI OPEN ROTATOR CUFF REPAIR, AND BICEPS TENDODESIS;  Surgeon: Augustin Schooling, MD;  Location: [facility];  Service: Orthopedics;  Laterality: Right;  . TONSILLECTOMY      Family History  Problem Relation Age of Onset  . Arthritis Mother   . Hyperlipidemia Mother   . Skin cancer Father   . Depression Father   . Parkinson's disease Father     Social History   Socioeconomic History  . Marital status: Married    Spouse name: Not on file  . Number of children: 2  . Years of education: Masters  . Highest education level: Not on file  Occupational History  .  Occupation: Management/Librarian  Social Needs  . Financial resource strain: Not on file  . Food insecurity:    Worry: Not on file    Inability: Not on file  . Transportation needs:    Medical: Not on file    Non-medical: Not on file  Tobacco Use  . Smoking status: Never Smoker  . Smokeless tobacco: Never Used  Substance and Sexual Activity  . Alcohol use: Yes    Comment: rarely  . Drug use: No  . Sexual activity: Not on file  Lifestyle  . Physical activity:    Days per week: Not on file    Minutes per session: Not on file  . Stress: Not on file  Relationships  . Social connections:    Talks on phone: Not on file    Gets together: Not on file    Attends religious service: Not on file    Active member of club or organization: Not on file    Attends meetings of clubs or organizations: Not on file    Relationship status: Not on file  . Intimate partner violence:    Fear of current or ex partner: Not on file    Emotionally abused: Not on file    Physically abused: Not on file    Forced sexual activity: Not on file  Other Topics Concern  . Not on file  Social History Narrative   Lives at home with her husband.   Right-handed.   6-8 cups caffeine per day.      Review of systems: Review of Systems  Constitutional: Negative for fever and chills.  HENT: Negative.   Eyes: Negative for blurred vision.  Respiratory: Negative for cough, shortness of breath and wheezing.   Cardiovascular: Negative for chest pain and palpitations.  Gastrointestinal: as per HPI Genitourinary: Negative for dysuria, urgency, frequency and hematuria.  Musculoskeletal: Negative for myalgias, back pain and joint pain.  Skin: Negative for itching and rash.  Neurological: Negative for dizziness, tremors, focal weakness, seizures and loss of consciousness.  Endo/Heme/Allergies: Negative for seasonal allergies.  Psychiatric/Behavioral: Negative for depression, suicidal ideas and hallucinations.  All  other systems reviewed and are negative.   Physical Exam: Vitals:   06/24/17 1047  BP: 102/64  Pulse: 76   Body mass index is 24.78 kg/m. Gen:      No acute distress HEENT:  EOMI, sclera anicteric Neck:     No masses; no thyromegaly Lungs:    Clear to auscultation bilaterally; normal respiratory effort CV:         Regular rate and rhythm; no murmurs Abd:      + bowel sounds; soft, non-tender; no palpable masses, no distension Ext:    No edema; adequate peripheral perfusion Skin:      Warm and dry; no rash Neuro: alert  and oriented x 3 Psych: normal mood and affect  Data Reviewed:  Reviewed labs, radiology imaging, old records and pertinent past GI work up   Assessment and Plan/Recommendations:  62 year old female with history of chronic GERD here with complaints of worsening heartburn and occasional dysphagia We will schedule EGD to exclude esophagitis or peptic stricture The risks and benefits as well as alternatives of endoscopic procedure(s) have been discussed and reviewed. All questions answered. The patient agrees to proceed.  Patient is worried about potential side effects with prolonged acid suppression with H2 blockers or PPI Discussed the benefit and grams associated with the medication and given she has persistent symptoms which are likely related to acid reflux, will need acid suppression long-term Discussed antireflux measures and lifestyle modifications Decrease the amount of caffeinated drinks and avoid meals 3 hours before bedtime Continue Zantac at bedtime as needed Gaviscon as needed up to 3 times daily  Colorectal cancer screening: Normal colonoscopy in 2008, she is due for screening Patient would like to do cologaurd for screening, will proceed with it If negative will need repeat in 3 years but if cologaurd positive will need follow-up colonoscopy to exclude any neoplastic lesions   K. Denzil Magnuson , MD [phone]    CC: Darcus Austin, MD

## 2017-06-24 NOTE — Patient Instructions (Addendum)
If you are age 61 or older, your body mass index should be between 23-30. Your Body mass index is 24.78 kg/m. If this is out of the aforementioned range listed, please consider follow up with your Primary Care Provider.  If you are age 26 or younger, your body mass index should be between 19-25. Your Body mass index is 24.78 kg/m. If this is out of the aformentioned range listed, please consider follow up with your Primary Care Provider.   You have been scheduled for an endoscopy. Please follow written instructions given to you at your visit today. If you use inhalers (even only as needed), please bring them with you on the day of your procedure. Your physician has requested that you go to www.startemmi.com and enter the access code given to you at your visit today. This web site gives a general overview about your procedure. However, you should still follow specific instructions given to you by our office regarding your preparation for the procedure.  Please purchase the following medications over the counter and take as directed: Gaviscon 1 tablet up to 3 times a day.  Continue to take your Ranitidine at bedtime as needed.  You have been given information on Anti-Reflux Measures.  Your provider has ordered Cologuard testing as an option for colon cancer screening. This is performed by [employer] and may be out of network with your insurance. PRIOR to completing the test, it is YOUR responsibility to contact your insurance about covered benefits for this test. Your out of pocket expense could be anywhere from $0.00 to $649.00.   When you call to check coverage with your insurer, please provide the following information:   -The ONLY provider of Cologuard is [place] code for Cologuard is [phone].  [employer] NPI # [id number]  -[employer] Tax ID # [id number]   We have already sent your demographic and insurance information to [employer] (phone number [phone]) and they should contact you within the next week regarding your test. If you have not heard from them within the next week, please call our office at [phone].   Thank you for choosing [facility], Dr. Silverio Decamp

## 2017-07-08 ENCOUNTER — Other Ambulatory Visit: Payer: Self-pay

## 2017-07-08 ENCOUNTER — Encounter: Payer: Self-pay | Admitting: Gastroenterology

## 2017-07-08 ENCOUNTER — Ambulatory Visit (AMBULATORY_SURGERY_CENTER): Payer: 59 | Admitting: Gastroenterology

## 2017-07-08 VITALS — BP 119/73 | HR 77 | Temp 98.2°F | Resp 17 | Ht 63.0 in | Wt 142.0 lb

## 2017-07-08 DIAGNOSIS — K219 Gastro-esophageal reflux disease without esophagitis: Secondary | ICD-10-CM | POA: Diagnosis present

## 2017-07-08 DIAGNOSIS — R0989 Other specified symptoms and signs involving the circulatory and respiratory systems: Secondary | ICD-10-CM

## 2017-07-08 DIAGNOSIS — F458 Other somatoform disorders: Secondary | ICD-10-CM

## 2017-07-08 DIAGNOSIS — R1310 Dysphagia, unspecified: Secondary | ICD-10-CM | POA: Diagnosis not present

## 2017-07-08 DIAGNOSIS — K449 Diaphragmatic hernia without obstruction or gangrene: Secondary | ICD-10-CM

## 2017-07-08 LAB — HM COLONOSCOPY

## 2017-07-08 MED ORDER — SODIUM CHLORIDE 0.9 % IV SOLN: 500.0000 mL | Freq: Once | INTRAVENOUS | Status: DC

## 2017-07-08 NOTE — Op Note (Signed)
[place] [facility] Patient Name: [name] Procedure Date: 07/08/2017 10:16 AM MRN: [id number] Endoscopist: Mauri Pole , MD Age: 62 Referring MD:  Date of Birth: Jun 30, 1955 Gender: Female Account #: [id number] Procedure:                Upper GI endoscopy Indications:              Dysphagia, Esophageal reflux, Globus sensation Medicines:                Monitored Anesthesia Care Procedure:                Pre-Anesthesia Assessment:                           - Prior to the procedure, a History and Physical                            was performed, and patient medications and                            allergies were reviewed. The patient's tolerance of                            previous anesthesia was also reviewed. The risks                            and benefits of the procedure and the sedation                            options and risks were discussed with the patient.                            All questions were answered, and informed consent                            was obtained. Prior Anticoagulants: The patient has                            taken no previous anticoagulant or antiplatelet                            agents. ASA Grade Assessment: II - A patient with                            mild systemic disease. After reviewing the risks                            and benefits, the patient was deemed in                            satisfactory condition to undergo the procedure.                           After obtaining informed consent, the endoscope was  passed under direct vision. Throughout the                            procedure, the patient's blood pressure, pulse, and                            oxygen saturations were monitored continuously. The                            Endoscope was introduced through the mouth, and                            advanced to the second part of duodenum. The upper                            GI  endoscopy was accomplished without difficulty.                            The patient tolerated the procedure well. Scope In: Scope Out: Findings:                 Esophagogastric landmarks were identified: the                            Z-line was found at 35 cm and the site of hiatal                            narrowing was found at 38 cm from the incisors.                           One benign-appearing, intrinsic mild stenosis was                            found 34 to 35 cm from the incisors. This stenosis                            measured greater than or equal to 3 cm (inner                            diameter) x less than one cm (in length). The                            stenosis was traversed.                           A small hiatal hernia was present.                           The exam of the stomach was otherwise normal.                           The examined duodenum was normal. Complications:            No immediate complications. Estimated Blood Loss:  Estimated blood loss: none. Impression:               - Esophagogastric landmarks identified.                           - Benign-appearing esophageal stenosis.                           - Small hiatal hernia.                           - Normal examined duodenum.                           - No specimens collected. Recommendation:           - Patient has a contact number available for                            emergencies. The signs and symptoms of potential                            delayed complications were discussed with the                            patient. Return to normal activities tomorrow.                            Written discharge instructions were provided to the                            patient.                           - Resume previous diet.                           - Continue present medications.                           - Return to GI office PRN. Mauri Pole, MD 07/08/2017 10:27:21  AM This report has been signed electronically.

## 2017-07-08 NOTE — Patient Instructions (Signed)
*  Handout given to care partner on hiatal hernia (GERD protocol).  YOU HAD AN ENDOSCOPIC PROCEDURE TODAY AT [facility]:   Refer to the procedure report that was given to you for any specific questions about what was found during the examination.  If the procedure report does not answer your questions, please call your gastroenterologist to clarify.  If you requested that your care partner not be given the details of your procedure findings, then the procedure report has been included in a sealed envelope for you to review at your convenience later.  YOU SHOULD EXPECT: Some feelings of bloating in the abdomen. Passage of more gas than usual.  Walking can help get rid of the air that was put into your GI tract during the procedure and reduce the bloating. If you had a lower endoscopy (such as a colonoscopy or flexible sigmoidoscopy) you may notice spotting of blood in your stool or on the toilet paper. If you underwent a bowel prep for your procedure, you may not have a normal bowel movement for a few days.  Please Note:  You might notice some irritation and congestion in your nose or some drainage.  This is from the oxygen used during your procedure.  There is no need for concern and it should clear up in a day or so.  SYMPTOMS TO REPORT IMMEDIATELY:    Following upper endoscopy (EGD)  Vomiting of blood or coffee ground material  New chest pain or pain under the shoulder blades  Painful or persistently difficult swallowing  New shortness of breath  Fever of 100F or higher  Black, tarry-looking stools  For urgent or emergent issues, a gastroenterologist can be reached at any hour by calling [phone].   DIET:  We do recommend a small meal at first, but then you may proceed to your regular diet.  Drink plenty of fluids but you should avoid alcoholic beverages for 24 hours.  ACTIVITY:  You should plan to take it easy for the rest of today and you should NOT DRIVE or use  heavy machinery until tomorrow (because of the sedation medicines used during the test).    FOLLOW UP: Our staff will call the number listed on your records the next business day following your procedure to check on you and address any questions or concerns that you may have regarding the information given to you following your procedure. If we do not reach you, we will leave a message.  However, if you are feeling well and you are not experiencing any problems, there is no need to return our call.  We will assume that you have returned to your regular daily activities without incident.  If any biopsies were taken you will be contacted by phone or by letter within the next 1-3 weeks.  Please call us at [phone] if you have not heard about the biopsies in 3 weeks.    SIGNATURES/CONFIDENTIALITY: You and/or your care partner have signed paperwork which will be entered into your electronic medical record.  These signatures attest to the fact that that the information above on your After Visit Summary has been reviewed and is understood.  Full responsibility of the confidentiality of this discharge information lies with you and/or your care-partner.

## 2017-07-08 NOTE — Progress Notes (Signed)
To PACU, vss patent aw report to rn 

## 2017-07-09 ENCOUNTER — Telehealth: Payer: Self-pay | Admitting: *Deleted

## 2017-07-09 DIAGNOSIS — Z1211 Encounter for screening for malignant neoplasm of colon: Secondary | ICD-10-CM | POA: Diagnosis not present

## 2017-07-09 DIAGNOSIS — Z1212 Encounter for screening for malignant neoplasm of rectum: Secondary | ICD-10-CM | POA: Diagnosis not present

## 2017-07-09 NOTE — Telephone Encounter (Signed)
No answer. Number identifier. Message left to call if questions or concerns and we will make an attempt to call later in the day.

## 2017-07-09 NOTE — Telephone Encounter (Signed)
  Follow up Call-  Call back number 07/08/2017  Post procedure Call Back phone  # [phone]  Permission to leave phone message Yes  Some recent data might be hidden     Patient questions:  Do you have a fever, pain , or abdominal swelling? No. Pain Score  0 *  Have you tolerated food without any problems? Yes.    Have you been able to return to your normal activities? Yes.    Do you have any questions about your discharge instructions: Diet   No. Medications  No. Follow up visit  No.  Do you have questions or concerns about your Care? No.  Actions: * If pain score is 4 or above: No action needed, pain <4.

## 2017-07-24 ENCOUNTER — Other Ambulatory Visit: Payer: Self-pay

## 2017-07-24 LAB — COLOGUARD: Cologuard: NEGATIVE

## 2017-07-28 ENCOUNTER — Other Ambulatory Visit: Payer: Self-pay | Admitting: Neurology

## 2017-07-29 ENCOUNTER — Ambulatory Visit: Payer: Commercial Managed Care - HMO | Admitting: Neurology

## 2017-08-28 ENCOUNTER — Emergency Department (HOSPITAL_COMMUNITY)
Admission: EM | Admit: 2017-08-28 | Discharge: 2017-08-28 | Disposition: A | Discharge status: Home / Self Care | Payer: 59 | Attending: Emergency Medicine | Admitting: Emergency Medicine

## 2017-08-28 ENCOUNTER — Emergency Department (HOSPITAL_COMMUNITY): Payer: 59

## 2017-08-28 ENCOUNTER — Encounter (HOSPITAL_COMMUNITY): Payer: Self-pay | Admitting: Emergency Medicine

## 2017-08-28 DIAGNOSIS — Z79899 Other long term (current) drug therapy: Secondary | ICD-10-CM | POA: Diagnosis not present

## 2017-08-28 DIAGNOSIS — R079 Chest pain, unspecified: Secondary | ICD-10-CM | POA: Diagnosis not present

## 2017-08-28 DIAGNOSIS — R0789 Other chest pain: Secondary | ICD-10-CM | POA: Insufficient documentation

## 2017-08-28 DIAGNOSIS — Z7982 Long term (current) use of aspirin: Secondary | ICD-10-CM | POA: Diagnosis not present

## 2017-08-28 LAB — CBC WITH DIFFERENTIAL/PLATELET
Abs Immature Granulocytes: 0 10*3/uL (ref 0.0–0.1)
Basophils Absolute: 0.2 10*3/uL — ABNORMAL HIGH (ref 0.0–0.1)
Basophils Relative: 2 %
Eosinophils Absolute: 0.4 10*3/uL (ref 0.0–0.7)
Eosinophils Relative: 4 %
HCT: 46.4 % — ABNORMAL HIGH (ref 36.0–46.0)
Hemoglobin: 15 g/dL (ref 12.0–15.0)
Immature Granulocytes: 0 %
Lymphocytes Relative: 28 %
Lymphs Abs: 2.8 10*3/uL (ref 0.7–4.0)
MCH: 30.2 pg (ref 26.0–34.0)
MCHC: 32.3 g/dL (ref 30.0–36.0)
MCV: 93.5 fL (ref 78.0–100.0)
Monocytes Absolute: 0.9 10*3/uL (ref 0.1–1.0)
Monocytes Relative: 9 %
Neutro Abs: 5.8 10*3/uL (ref 1.7–7.7)
Neutrophils Relative %: 57 %
Platelets: 305 10*3/uL (ref 150–400)
RBC: 4.96 MIL/uL (ref 3.87–5.11)
RDW: 12.4 % (ref 11.5–15.5)
WBC: 10 10*3/uL (ref 4.0–10.5)

## 2017-08-28 LAB — BASIC METABOLIC PANEL
Anion gap: 10 (ref 5–15)
BUN: 14 mg/dL (ref 6–20)
CO2: 26 mmol/L (ref 22–32)
Calcium: 9.2 mg/dL (ref 8.9–10.3)
Chloride: 106 mmol/L (ref 101–111)
Creatinine, Ser: 0.65 mg/dL (ref 0.44–1.00)
GFR calc Af Amer: >60 mL/min (ref >60)
GFR calc non Af Amer: >60 mL/min (ref >60)
Glucose, Bld: 88 mg/dL (ref 65–99)
Potassium: 3.8 mmol/L (ref 3.5–5.1)
Sodium: 142 mmol/L (ref 135–145)

## 2017-08-28 LAB — I-STAT TROPONIN, ED: Troponin i, poc: 0 ng/mL (ref 0.00–0.08)

## 2017-08-28 NOTE — ED Triage Notes (Signed)
Pt endorses several months of intermittent chest pain during exercise. None at present. Shortness of breath sometimes during exercise. The pain will radiate into her left arm.

## 2017-08-28 NOTE — ED Provider Notes (Signed)
Patient placed in Quick Look pathway, seen and evaluated   Chief Complaint: Chest pain   HPI:   Several month history of left chest pain with radiation into left arm. Only present with ambulation. HO mitral valve prolapse. Non smoker, no HTN, DM. Father with a stent. No active chest pain.   ROS: chest pain (one)  Physical Exam:   Gen: No distress  Neuro: Awake and Alert  Skin: Warm    Focused Exam: heart RRR- lungs clear    Initiation of care has begun. The patient has been counseled on the process, plan, and necessity for staying for the completion/evaluation, and the remainder of the medical screening examination    Okey Regal, Hershal Coria 08/28/17 1638    Pattricia Boss, MD 08/29/17 [phone]

## 2017-08-28 NOTE — ED Provider Notes (Signed)
[facility] Provider Note   CSN: [id number] Arrival date & time: 08/28/17  1619     History   Chief Complaint Chief Complaint  Patient presents with  . Chest Pain    HPI [name] is a 62 y.o. female.  62 year old female with prior history of mitral valve prolapse, and GERD presents with complaint of exertional chest pressure.  She reports several months of left-sided chest discomfort when she exerts herself.  Symptoms only occur with exertion.  At rest, she never has symptoms.  She has no chest pain or discomfort upon my evaluation.  She contacted her regular doctor today, who referred her directly to the ED for evaluation.  She denies prior history of CAD.  The history is provided by the patient.  Chest Pain   The current episode started more than 1 week ago. The problem occurs every several days. The problem has not changed since onset.The pain is associated with exertion. The pain is present in the substernal region. The pain is mild. The quality of the pain is described as exertional. The pain does not radiate. She has tried nothing for the symptoms.    Past Medical History:  Diagnosis Date  . Anxiety    very high  . GERD (gastroesophageal reflux disease)   . Glaucoma   . Heart murmur    Pt saw Cardiologist when she was in her 92s, not since.  . IBS (irritable bowel syndrome)   . Neuropathy 2016   in right leg  . Numbness and tingling   . Osteoporosis   . Scoliosis     Patient Active Problem List   Diagnosis Date Noted  . Neuropathic pain 01/26/2017  . Right leg pain 07/24/2016  . Right leg swelling 07/24/2016  . Paresthesia 06/12/2016  . 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 GASTRITIS, ACUTE 05/26/2007  . 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 GALLBLADDER DISEASE 03/11/2007    Past Surgical History:  Procedure Laterality Date  . CESAREAN SECTION  1988  . SHOULDER ARTHROSCOPY WITH SUBACROMIAL  DECOMPRESSION AND OPEN ROTATOR C Right 12/09/2013   Procedure: RIGHT SHOULDER ARTHROSCOPY WITH SUBACROMIAL DECOMPRESSION AND MINI OPEN ROTATOR CUFF REPAIR, AND BICEPS TENDODESIS;  Surgeon: Augustin Schooling, MD;  Location: [facility];  Service: Orthopedics;  Laterality: Right;  . TONSILLECTOMY       OB History   None      Home Medications    Prior to Admission medications   Medication Sig Start Date End Date Taking? Authorizing Provider  alendronate (FOSAMAX) 70 MG tablet Take 70 mg by mouth once a week. Mondays 07/20/16  Yes [provider]  aspirin 81 MG chewable tablet Chew 81 mg by mouth as needed.    Yes [provider]  b complex vitamins capsule Take 1 capsule by mouth every other day.    Yes [provider]  Cholecalciferol (VITAMIN D) 2000 UNITS CAPS Take 2,000 Units by mouth daily.   Yes [provider]  cycloSPORINE (RESTASIS) 0.05 % ophthalmic emulsion Place 1 drop into both eyes 2 (two) times daily.   Yes [provider]  latanoprost (XALATAN) 0.005 % ophthalmic solution Place 1 drop into both eyes at bedtime.   Yes [provider]  Magnesium 250 MG TABS Take 250 mg by mouth every other day.    Yes [provider]  Omega-3 Fatty Acids (FISH OIL PO) Take 1 capsule by mouth daily.   Yes [provider]  ranitidine (ZANTAC) 150 MG tablet Take 150 mg by mouth daily.   Yes [provider]  YUVAFEM 10 MCG TABS vaginal tablet , on tuesdays and fridays 06/28/17  Yes [provider]  gabapentin (NEURONTIN) 100 MG capsule Take 1 capsule (100 mg total) by mouth 3 (three) times daily as needed. Patient not taking: Reported on 08/28/2017 07/28/17   Marcial Pacas, MD    Family History Family History  Problem Relation Age of Onset  . Arthritis Mother   . Hyperlipidemia Mother   . Skin cancer Father   . Depression Father   . Parkinson's disease Father   . Colon cancer Neg Hx   . Colon polyps Neg Hx   .  Esophageal cancer Neg Hx   . Stomach cancer Neg Hx   . Rectal cancer Neg Hx     Social History Social History   Tobacco Use  . Smoking status: Never Smoker  . Smokeless tobacco: Never Used  Substance Use Topics  . Alcohol use: Yes    Comment: rarely  . Drug use: No     Allergies   Patient has no known allergies.   Review of Systems Review of Systems  Cardiovascular: Positive for chest pain.  All other systems reviewed and are negative.    Physical Exam Updated Vital Signs BP 132/77   Pulse 81   Temp 98.5 F (36.9 C) (Oral)   Resp 17   Ht 5\' 4"  (1.626 m)   Wt 62.6 kg (138 lb)   SpO2 98%   BMI 23.69 kg/m   Physical Exam  Constitutional: She is oriented to person, place, and time. She appears well-developed and well-nourished. No distress.  HENT:  Head: Normocephalic and atraumatic.  Mouth/Throat: Oropharynx is clear and moist.  Eyes: Pupils are equal, round, and reactive to light. Conjunctivae and EOM are normal.  Neck: Normal range of motion. Neck supple.  Cardiovascular: Normal rate, regular rhythm and normal heart sounds.  Pulmonary/Chest: Effort normal and breath sounds normal. No respiratory distress.  Abdominal: Soft. She exhibits no distension. There is no tenderness.  Musculoskeletal: Normal range of motion. She exhibits no edema or deformity.  Neurological: She is alert and oriented to person, place, and time.  Skin: Skin is warm and dry.  Psychiatric: She has a normal mood and affect.  Nursing note and vitals reviewed.    ED Treatments / Results  Labs (all labs ordered are listed, but only abnormal results are displayed) Labs Reviewed  CBC WITH DIFFERENTIAL/PLATELET - Abnormal; Notable for the following components:      Result Value   HCT 46.4 (*)    Basophils Absolute 0.2 (*)    All other components within normal limits  BASIC METABOLIC PANEL  I-STAT TROPONIN, ED    EKG EKG Interpretation  Date/Time:  Friday August 28 2017 16:28:23  EDT Ventricular Rate:  79 PR Interval:  136 QRS Duration: 84 QT Interval:  356 QTC Calculation: 408 R Axis:   46 Text Interpretation:  Normal sinus rhythm Cannot rule out Anterior infarct , age undetermined Abnormal ECG Confirmed by Dene Gentry [phone]) on 08/28/2017 9:08:17 PM   Radiology Dg Chest 2 View  Result Date: 08/28/2017 CLINICAL DATA:  Chest pain. EXAM: CHEST - 2 VIEW COMPARISON:  Radiographs of April 16, 2015. FINDINGS: The heart size and mediastinal contours are within normal limits. Both lungs are clear. No pneumothorax or pleural effusion is noted. The visualized skeletal structures are unremarkable. IMPRESSION: No active cardiopulmonary disease. Electronically Signed  By: Marijo Conception, M.D.   On: 08/28/2017 17:19    Procedures Procedures (including critical care time)  Medications Ordered in ED Medications - No data to display   Initial Impression / Assessment and Plan / ED Course  I have reviewed the triage vital signs and the nursing notes.  Pertinent labs & imaging results that were available during my care of the patient were reviewed by me and considered in my medical decision making (see chart for details).     MDM  Screen complete  Patient presents for evaluation of exertional chest discomfort.  At rest she has no symptoms. Symptoms ongoing for several months.  EKG today is without evidence of acute ischemia.  Initial troponin is negative.  Other screening labs are without significant abnormality.  Patient is not interested in overnight admission and/or further observation.  It is a Friday evening and, as per cardiology, it is unlikely that the patient can get a stress test as an inpatient over the weekend.  The patient desires discharge home.  She understands that she absolutely needs cardiology follow-up.  Case was discussed with the on-call cardiologist who has taken her name and will obtain a follow-up appointment with her on Monday morning for  likely outpatient stress test.  Patient understands need to closely follow-up with cardiology.  She is aware of the need to return to the ED if she has recurrent chest pain.  She will abstain from exertion or exercise until she can follow-up with cardiology.  Final Clinical Impressions(s) / ED Diagnoses   Final diagnoses:  Chest pain, unspecified type    ED Discharge Orders        Ordered    Ambulatory referral to Cardiology    Comments:  Patient needs outpatient cardiac stress test   08/28/17 2128       Valarie Merino, MD 08/28/17 2200

## 2017-08-28 NOTE — Discharge Instructions (Signed)
Return for any problem.  Follow-up with your regular doctor as instructed.  You need further evaluation by cardiology.  [facility] is aware of your case and should contact you on Monday for further evaluation.  If you do not hear from cardiology on Monday please call and arrange follow-up.  Return to the ED for recurrent chest pain, shortness of breath, or other complaint.

## 2017-08-28 NOTE — ED Notes (Signed)
ED Provider at bedside. 

## 2017-08-28 NOTE — Significant Event (Signed)
I was called by the ED to discuss this patient. She reportedly called her PCP and described a long history of intermittent chest discomfort with exertion. She was urged to go to the ED. In the ED, the patient has been chest pain free. ECG unremarkable and troponin negative x1. She was encouraged to stay for observation but was not interested in staying.   A request was made by the ED physician for outpatient follow up, which I will pass along to [facility] group. She should also follow up with her PCP. She was urged to come back to the ED with any recurrent chest pain.

## 2017-08-31 NOTE — ED Notes (Signed)
08/31/2017, Pt. Called and requested a referral from Dr. Francia Greaves for a stress Test for June 24,2019.  Reviewed Dr. Deanna Artis note and he referred pt. To Cardiology, [facility] for an appointment to be evaluated for a Stress Test.  Explained this to the pt.  She verbalized understanding and has already set up and appointment.  I apologized for the confusion and also informed her if she has any s/s of chest pain, sob, arm pain, dizziness or n/v/d, to return to the ED.  I also informed her to follow her discharge information that was reviewed with her during her discharge.    She verbalized understanding and all questions answered.

## 2017-09-08 NOTE — Progress Notes (Signed)
Referring-Peter Messick MD Reason for referral-Chest pain  HPI: 62 yo female for evaluation of chest pain at request of Dene Gentry MD. Also with h/o MVP. Seen 08/28/17 in ER with CP; troponin and chest xray neg; Hgb 15 and K 3.8.  Patient's husband recently had surgery for aortic valve replacement/aortic root replacement secondary to bicuspid aortic valve.  Over the past several months she has had exertional chest and left upper extremity pain.  It is relieved with rest.  Occurs in the left triceps area and left lateral chest area.  There is associated dyspnea but no nausea or diaphoresis.  The pain is not pleuritic or positional.  She denies orthopnea, PND, pedal edema or syncope.  Because of the above we were asked to evaluate.  Current Outpatient Medications  Medication Sig Dispense Refill  . alendronate (FOSAMAX) 70 MG tablet Take 70 mg by mouth once a week. Mondays    . aspirin 81 MG chewable tablet Chew 81 mg by mouth daily.     Marland Kitchen b complex vitamins capsule Take 1 capsule by mouth every other day.     . Cholecalciferol (VITAMIN D) 2000 UNITS CAPS Take 2,000 Units by mouth daily.    . cycloSPORINE (RESTASIS) 0.05 % ophthalmic emulsion Place 1 drop into both eyes 2 (two) times daily.    Marland Kitchen latanoprost (XALATAN) 0.005 % ophthalmic solution Place 1 drop into both eyes at bedtime.    . Magnesium 250 MG TABS Take 250 mg by mouth every other day.     . Omega-3 Fatty Acids (FISH OIL PO) Take 1 capsule by mouth daily.    . ranitidine (ZANTAC) 150 MG tablet Take 150 mg by mouth daily.    Merril Abbe 10 MCG TABS vaginal tablet , on tuesdays and fridays  11   Current Facility-Administered Medications  Medication Dose Route Frequency Provider Last Rate Last Dose  . 0.9 %  sodium chloride infusion  500 mL Intravenous Once Nandigam, Venia Minks, MD        No Known Allergies   Past Medical History:  Diagnosis Date  . Anxiety    very high  . GERD (gastroesophageal reflux disease)   . Glaucoma    . Heart murmur    Pt saw Cardiologist when she was in her 49s, not since.  . IBS (irritable bowel syndrome)   . MVP (mitral valve prolapse)   . Neuropathy 2016   in right leg  . Osteoporosis   . Scoliosis     Past Surgical History:  Procedure Laterality Date  . CESAREAN SECTION  1988  . SHOULDER ARTHROSCOPY WITH SUBACROMIAL DECOMPRESSION AND OPEN ROTATOR C Right 12/09/2013   Procedure: RIGHT SHOULDER ARTHROSCOPY WITH SUBACROMIAL DECOMPRESSION AND MINI OPEN ROTATOR CUFF REPAIR, AND BICEPS TENDODESIS;  Surgeon: Augustin Schooling, MD;  Location: [place];  Service: Orthopedics;  Laterality: Right;  . TONSILLECTOMY      Social History   Socioeconomic History  . Marital status: Married    Spouse name: Not on file  . Number of children: 2  . Years of education: Masters  . Highest education level: Not on file  Occupational History  . Occupation: Management/Librarian  Social Needs  . Financial resource strain: Not on file  . Food insecurity:    Worry: Not on file    Inability: Not on file  . Transportation needs:    Medical: Not on file    Non-medical: Not on file  Tobacco Use  . Smoking status:  Never Smoker  . Smokeless tobacco: Never Used  Substance and Sexual Activity  . Alcohol use: Yes    Comment: rarely  . Drug use: No  . Sexual activity: Not on file  Lifestyle  . Physical activity:    Days per week: Not on file    Minutes per session: Not on file  . Stress: Not on file  Relationships  . Social connections:    Talks on phone: Not on file    Gets together: Not on file    Attends religious service: Not on file    Active member of club or organization: Not on file    Attends meetings of clubs or organizations: Not on file    Relationship status: Not on file  . Intimate partner violence:    Fear of current or ex partner: Not on file    Emotionally abused: Not on file    Physically abused: Not on file    Forced sexual activity: Not on file  Other Topics Concern  .  Not on file  Social History Narrative   Lives at home with her husband.   Right-handed.   6-8 cups caffeine per day.    Family History  Problem Relation Age of Onset  . Arthritis Mother   . Hyperlipidemia Mother   . Atrial fibrillation Mother   . Skin cancer Father   . Depression Father   . Parkinson's disease Father   . CAD Father   . Colon cancer Neg Hx   . Colon polyps Neg Hx   . Esophageal cancer Neg Hx   . Stomach cancer Neg Hx   . Rectal cancer Neg Hx     ROS: no fevers or chills, productive cough, hemoptysis, dysphasia, odynophagia, melena, hematochezia, dysuria, hematuria, rash, seizure activity, orthopnea, PND, pedal edema, claudication. Remaining systems are negative.  Physical Exam:   Blood pressure 90/72, pulse 80, height 5' 3.5" (1.613 m), weight 140 lb (63.5 kg).  General:  Well developed/well nourished in NAD Skin warm/dry Patient not depressed No peripheral clubbing Back-normal HEENT-normal/normal eyelids Neck supple/normal carotid upstroke bilaterally; no bruits; no JVD; no thyromegaly chest - CTA/ normal expansion CV - RRR/normal S1 and S2; no murmurs, rubs or gallops;  PMI nondisplaced Abdomen -NT/ND, no HSM, no mass, + bowel sounds, no bruit 2+ femoral pulses, no bruits Ext-no edema, chords, 2+ DP Neuro-grossly nonfocal  ECG -August 28, 2017-sinus rhythm, cannot rule out prior anterior infarct versus lead placement, mild nonspecific ST changes.  Personally reviewed  Today's electrocardiogram shows sinus rhythm with no significant ST changes.  A/P  1 chest pain-with both typical and atypical features.  Concerning occurs with exertion.  Previous ECG showed mild ST depression.  We will plan to proceed with CTA to further assess for coronary artery disease.  Continue aspirin.  Low threshold for cardiac catheterization.  2 mitral valve prolapse-I will arrange an echocardiogram to further evaluate.  Kirk Ruths, MD

## 2017-09-17 ENCOUNTER — Ambulatory Visit: Payer: 59 | Admitting: Cardiology

## 2017-09-17 ENCOUNTER — Encounter: Payer: Self-pay | Admitting: Cardiology

## 2017-09-17 ENCOUNTER — Encounter

## 2017-09-17 VITALS — BP 90/72 | HR 76 | Ht 63.5 in | Wt 140.0 lb

## 2017-09-17 DIAGNOSIS — I341 Nonrheumatic mitral (valve) prolapse: Secondary | ICD-10-CM | POA: Diagnosis not present

## 2017-09-17 DIAGNOSIS — R0789 Other chest pain: Secondary | ICD-10-CM

## 2017-09-17 MED ORDER — METOPROLOL TARTRATE 50 MG PO TABS: ORAL_TABLET | ORAL | 0 refills | Status: DC

## 2017-09-17 NOTE — Patient Instructions (Addendum)
Dr. Stanford Breed has requested that you have an echocardiogram. Echocardiography is a painless test that uses sound waves to create images of your heart. It provides your doctor with information about the size and shape of your heart and how well your heart's chambers and valves are working. This procedure takes approximately one hour. There are no restrictions for this procedure. -- done at [address] - [address] Floor  Dr. Stanford Breed has ordered a coronary CT. This test will be preauthorized with your insurance and then you will be contacted to schedule. The instructions are noted below.   Your physician recommends that you schedule a follow-up appointment in 6-8 weeks with Dr. Stanford Breed (after testing)   Please arrive at the [facility] main entrance of [facility] at _____ AM/PM (30-45 minutes prior to test start time)  [facility] [place], [place] [id number] [phone]  Proceed to the [facility] Radiology Department (First Floor).  Please follow these instructions carefully (unless otherwise directed):  You will need to have blood work Artist) done prior to CT test  On the Night Before the Test: . Drink plenty of water. . Do not consume any caffeinated/decaffeinated beverages or chocolate 12 hours prior to your test. . Do not take any antihistamines 12 hours prior to your test.  On the Day of the Test: . Drink plenty of water. Do not drink any water within one hour of the test. . Do not eat any food 4 hours prior to the test. . You may take your regular medications prior to the test. . IF NOT ON A BETA BLOCKER - Take 50 mg of lopressor (metoprolol) one hour before the test.  After the Test: . Drink plenty of water. . After receiving IV contrast, you may experience a mild flushed feeling. This is normal. . On occasion, you may experience a mild rash up to 24 hours after the test. This is not dangerous. If this occurs, you can take Benadryl 25  mg and increase your fluid intake. . If you experience trouble breathing, this can be serious. If it is severe call 911 IMMEDIATELY. If it is mild, please call our office.

## 2017-09-22 ENCOUNTER — Other Ambulatory Visit: Payer: Self-pay

## 2017-09-22 ENCOUNTER — Ambulatory Visit (HOSPITAL_COMMUNITY): Payer: 59 | Attending: Cardiovascular Disease

## 2017-09-22 DIAGNOSIS — I341 Nonrheumatic mitral (valve) prolapse: Secondary | ICD-10-CM

## 2017-09-22 DIAGNOSIS — I059 Rheumatic mitral valve disease, unspecified: Secondary | ICD-10-CM | POA: Diagnosis present

## 2017-09-22 DIAGNOSIS — R0789 Other chest pain: Secondary | ICD-10-CM

## 2017-09-22 DIAGNOSIS — R0600 Dyspnea, unspecified: Secondary | ICD-10-CM | POA: Insufficient documentation

## 2017-09-22 DIAGNOSIS — Z8249 Family history of ischemic heart disease and other diseases of the circulatory system: Secondary | ICD-10-CM | POA: Insufficient documentation

## 2017-09-23 ENCOUNTER — Telehealth: Payer: Self-pay

## 2017-09-23 ENCOUNTER — Encounter: Payer: Self-pay | Admitting: Cardiology

## 2017-09-23 NOTE — Telephone Encounter (Signed)
Called patient regarding her ECHO results from a mychart message. Patient was given the information provided. Patient had no questions or concerns. She did mention not having to do the Coronary CT, but I advised patient to continue the plan to do so as it was a different test and can show different aspects of the heart. Patient verbalized understanding.

## 2017-09-28 ENCOUNTER — Encounter: Payer: Self-pay | Admitting: Neurology

## 2017-09-28 ENCOUNTER — Ambulatory Visit: Payer: 59 | Admitting: Neurology

## 2017-09-28 VITALS — BP 112/78 | HR 76 | Ht 63.5 in | Wt 142.0 lb

## 2017-09-28 DIAGNOSIS — M79604 Pain in right leg: Secondary | ICD-10-CM | POA: Diagnosis not present

## 2017-09-28 MED ORDER — GABAPENTIN 100 MG PO CAPS: 300.0000 mg | ORAL_CAPSULE | Freq: Three times a day (TID) | ORAL | 6 refills | Status: DC | PRN

## 2017-09-28 NOTE — Addendum Note (Signed)
Addended by: Marcial Pacas on: 09/28/2017 04:53 PM   Modules accepted: Level of Service

## 2017-09-28 NOTE — Progress Notes (Signed)
PATIENT: [name] DOB: [birth year]  Chief Complaint  Patient presents with  . Right leg pain    She has not had any right leg pain since 05/17/17.  She has completely tapered off gabapentin.  However, she would like to keep a supply to use as needed.    HISTORICAL  [name] is a 62 years old right-handed female, seen referred by her primary care physician Dr. Darcus Austin, for intermittent numbness tingling involving different areas, right worse than left.  She had a history of scoliosis, with chronic back pain, had right rotator cuff surgery in April 2015, she tends to sleep holding her right wrist flexion  Since April 2016, she reported frequent episodes of waking up in the middle of the night, felt numbness in her right hand, initially only involving right thumb, later she noticed numbness involving right first 3 fingers, getting better by change position, wrist splint has been really helpful  A month later, she also noticed intermittent numbness involving her right toe, sometimes extending into the ball of her right foot, she had a few episode of woke up notice right tongue numbness, short lasting,  No visual changes, she denies gait difficulty, she also complains of frequent awakening at night time,dry mouth, fatigue, sleepiness during the day  UPDATE June 12 2016; She was initially evaluated in 2016 for intermittent numbness involving right first 3 fingers,  right tongue, right foot,  Extensive evaluations failed to demonstrate etiology, in specific, there was no evidence of central nervous system etiology, no evidence of right cervical radiculopathy, or right upper extremity neuropathy   Her symptoms has much improved after she adjusting her hand position during sleep, she has mild low back pain, but since 2017, she was bothered by this intermittent set onset jolt of shooting sensation at right lateral leg, felt like somebody was tasering her, each episode only lasts a  few seconds, there was no persistent weakness or sensory loss,  Most severe episode was on April third, she had 4 episodes in one hour, each episode lasts about 15 seconds, severe sharp pain, in between episodes, she denies sensory loss, weakness, no low back pain, no gait abnormality.  MRI of the brain without contrast on September 13 2014 that was normal,  UPDATE Jul 24 2016: She was able to enjoy her [place] trip, taking gabapentin 300 mg 3 times a day which has helped her symptoms, she continue compliance of recurrent unpredictable set onset severe short pain at her right lateral leg, short lasting, no limitation in her functional afterwards, but during the painful spells," as if I have been tortured", which has caused significant psychological fear in her.  I have personally reviewed MRI of right lower extremity there was no significant abnormality noted.  Update Jan 26 2017: She still has intermittent right lower lateral leg pain, lasting for a few seconds, transient, but can have multiple episodes in the day, She was given steroid since Oct 2-13 2018,  She reduced gabapentin from 900 to 800mg  daily,   UPDATE September 28 2017: She is not sure about the benefit of right lateral neck nerve block, she continue have intermittent low back pain, reported a history of osteoporosis, compression fracture,  She had one flareup on May 17, 2017, to gabapentin, gradually tapered off to 100 mg 3 times a day on Jul 27, 2017,  REVIEW OF SYSTEMS: Full 14 system review of systems performed and notable only for as above  ALLERGIES: No  Known Allergies  HOME MEDICATIONS: Current Outpatient Prescriptions  Medication Sig Dispense Refill  . aspirin 81 MG chewable tablet Chew 81 mg by mouth daily.    . calcium carbonate (TUMS - DOSED IN MG ELEMENTAL CALCIUM) 500 MG chewable tablet Chew 1 tablet by mouth daily as needed for indigestion or heartburn.    . Cholecalciferol (VITAMIN D) 2000 UNITS CAPS Take 2,000 Units  by mouth daily.    . cycloSPORINE (RESTASIS) 0.05 % ophthalmic emulsion Place 1 drop into both eyes 2 (two) times daily.    . Omega-3 Fatty Acids (FISH OIL PO) Take 1 capsule by mouth daily.    Marland Kitchen VAGIFEM 10 MCG TABS vaginal tablet INSERT 1 TABLET VAGINALLY TWICE A WEEK  3     PAST MEDICAL HISTORY: Past Medical History:  Diagnosis Date  . Anxiety    very high  . GERD (gastroesophageal reflux disease)   . Glaucoma   . Heart murmur    Pt saw Cardiologist when she was in her 13s, not since.  . IBS (irritable bowel syndrome)   . MVP (mitral valve prolapse)   . Neuropathy 2016   in right leg  . Osteoporosis   . Scoliosis     PAST SURGICAL HISTORY: Past Surgical History:  Procedure Laterality Date  . CESAREAN SECTION  1988  . SHOULDER ARTHROSCOPY WITH SUBACROMIAL DECOMPRESSION AND OPEN ROTATOR C Right 12/09/2013   Procedure: RIGHT SHOULDER ARTHROSCOPY WITH SUBACROMIAL DECOMPRESSION AND MINI OPEN ROTATOR CUFF REPAIR, AND BICEPS TENDODESIS;  Surgeon: Augustin Schooling, MD;  Location: [facility];  Service: Orthopedics;  Laterality: Right;  . TONSILLECTOMY      FAMILY HISTORY: Family History  Problem Relation Age of Onset  . Arthritis Mother   . Hyperlipidemia Mother   . Atrial fibrillation Mother   . Skin cancer Father   . Depression Father   . Parkinson's disease Father   . CAD Father   . Colon cancer Neg Hx   . Colon polyps Neg Hx   . Esophageal cancer Neg Hx   . Stomach cancer Neg Hx   . Rectal cancer Neg Hx     SOCIAL HISTORY:  Social History   Socioeconomic History  . Marital status: Married    Spouse name: Not on file  . Number of children: 2  . Years of education: Masters  . Highest education level: Not on file  Occupational History  . Occupation: Management/Librarian  Social Needs  . Financial resource strain: Not on file  . Food insecurity:    Worry: Not on file    Inability: Not on file  . Transportation needs:    Medical: Not on file    Non-medical: Not  on file  Tobacco Use  . Smoking status: Never Smoker  . Smokeless tobacco: Never Used  Substance and Sexual Activity  . Alcohol use: Yes    Comment: rarely  . Drug use: No  . Sexual activity: Not on file  Lifestyle  . Physical activity:    Days per week: Not on file    Minutes per session: Not on file  . Stress: Not on file  Relationships  . Social connections:    Talks on phone: Not on file    Gets together: Not on file    Attends religious service: Not on file    Active member of club or organization: Not on file    Attends meetings of clubs or organizations: Not on file    Relationship status: Not on  file  . Intimate partner violence:    Fear of current or ex partner: Not on file    Emotionally abused: Not on file    Physically abused: Not on file    Forced sexual activity: Not on file  Other Topics Concern  . Not on file  Social History Narrative   Lives at home with her husband.   Right-handed.   6-8 cups caffeine per day.     PHYSICAL EXAM   Vitals:   09/28/17 1517  BP: 112/78  Pulse: 76  Weight: 142 lb (64.4 kg)  Height: 5' 3.5" (1.613 m)    Not recorded      Body mass index is 24.76 kg/m.  PHYSICAL EXAMNIATION:  Gen: NAD, conversant, well nourised, obese, well groomed                     Cardiovascular: Regular rate rhythm, no peripheral edema, warm, nontender. Eyes: Conjunctivae clear without exudates or hemorrhage Neck: Supple, no carotid bruise. Pulmonary: Clear to auscultation bilaterally   NEUROLOGICAL EXAM:  MENTAL STATUS: Speech:    Speech is normal; fluent and spontaneous with normal comprehension.  Cognition:    The patient is oriented to person, place, and time;     recent and remote memory intact;     language fluent;     normal attention, concentration,     fund of knowledge.  CRANIAL NERVES: CN II: Visual fields are full to confrontation. Fundoscopic exam is normal with sharp discs and no vascular changes. Venous pulsations  are present bilaterally. Pupils are 4 mm and briskly reactive to light.  CN III, IV, VI: extraocular movement are normal. No ptosis. CN V: Facial sensation is intact to pinprick in all 3 divisions bilaterally. Corneal responses are intact.  CN VII: Face is symmetric with normal eye closure and smile. CN VIII: Hearing is normal to rubbing fingers CN IX, X: Palate elevates symmetrically. Phonation is normal. Small oropharyngeal  CN XI: Head turning and shoulder shrug are intact CN XII: Tongue is midline with normal movements and no atrophy.  MOTOR: There is no pronator drift of out-stretched arms. Muscle bulk and tone are normal. Muscle strength is normal.  REFLEXES: Reflexes are 2+ and symmetric at the biceps, triceps, knees, and ankles. Plantar responses are flexor.  SENSORY: Light touch, pinprick, position sense, and vibration sense are intact in fingers and toes.  COORDINATION: Rapid alternating movements and fine finger movements are intact. There is no dysmetria on finger-to-nose and heel-knee-shin. There are no abnormal or extraneous movements.   GAIT/STANCE: Posture is normal. Gait is steady with normal steps, base, arm swing, and turning. Heel and toe walking are normal. Tandem gait is normal.  Romberg is absent.   DIAGNOSTIC DATA (LABS, IMAGING, TESTING) - I reviewed patient records, labs, notes, testing and imaging myself where available.  Lab Results  Component Value Date   WBC 10.0 08/28/2017   HGB 15.0 08/28/2017   HCT 46.4 (H) 08/28/2017   MCV 93.5 08/28/2017   PLT 305 08/28/2017      Component Value Date/Time   NA 142 08/28/2017 1649   K 3.8 08/28/2017 1649   CL 106 08/28/2017 1649   CO2 26 08/28/2017 1649   GLUCOSE 88 08/28/2017 1649   BUN 14 08/28/2017 1649   CREATININE 0.65 08/28/2017 1649   CALCIUM 9.2 08/28/2017 1649   PROT 6.9 09/26/2016 0802   ALBUMIN 4.2 09/26/2016 0802   AST 21 09/26/2016 0802   ALT  17 09/26/2016 0802   ALKPHOS 50 09/26/2016  0802   BILITOT 0.3 09/26/2016 0802   GFRNONAA >60 08/28/2017 1649   GFRAA >60 08/28/2017 1649    ASSESSMENT AND PLAN  [name] is a 62 y.o. female  Transient right lateral leg radiating pain  In the territory of right superficial peroneal nerve, If she has worsening low back pain, radiating pain to right lateral leg, may consider possibility of lumbar radiculopathy, discussed with patient, she wants to hold off evaluation at this point  Extensive evaluation in the past, normal MRI of the brain, MRI of right lower extremity negative venous, Doppler study of right leg, normal EMG nerve conduction study  Keep on Cymbalta 60 mg daily  Gabapentin 300 mg as needed   Marcial Pacas, M.D. Ph.D.  [facility] [address] [place], [place] [address] Ph: [phone] Fax: [phone]

## 2017-10-26 DIAGNOSIS — H40011 Open angle with borderline findings, low risk, right eye: Secondary | ICD-10-CM | POA: Diagnosis not present

## 2017-10-26 DIAGNOSIS — H401122 Primary open-angle glaucoma, left eye, moderate stage: Secondary | ICD-10-CM | POA: Diagnosis not present

## 2017-10-26 DIAGNOSIS — H40051 Ocular hypertension, right eye: Secondary | ICD-10-CM | POA: Diagnosis not present

## 2017-10-28 DIAGNOSIS — R0789 Other chest pain: Secondary | ICD-10-CM | POA: Diagnosis not present

## 2017-10-28 LAB — BASIC METABOLIC PANEL
BUN/Creatinine Ratio: 21 (ref 12–28)
BUN: 12 mg/dL (ref 8–27)
CALCIUM: 9.3 mg/dL (ref 8.7–10.3)
CO2: 24 mmol/L (ref 20–29)
Chloride: 100 mmol/L (ref 96–106)
Creatinine, Ser: 0.57 mg/dL (ref 0.57–1.00)
GFR calc Af Amer: 116 mL/min/{1.73_m2} (ref >59)
GFR, EST NON AFRICAN AMERICAN: 100 mL/min/{1.73_m2} (ref >59)
Glucose: 86 mg/dL (ref 65–99)
POTASSIUM: 4.5 mmol/L (ref 3.5–5.2)
Sodium: 139 mmol/L (ref 134–144)

## 2017-10-31 DIAGNOSIS — Z23 Encounter for immunization: Secondary | ICD-10-CM | POA: Diagnosis not present

## 2017-11-06 ENCOUNTER — Ambulatory Visit (HOSPITAL_COMMUNITY)
Admission: RE | Admit: 2017-11-06 | Discharge: 2017-11-06 | Disposition: A | Discharge status: Home / Self Care | Payer: 59 | Source: Ambulatory Visit | Attending: Cardiology | Admitting: Cardiology

## 2017-11-06 ENCOUNTER — Encounter (HOSPITAL_COMMUNITY): Payer: Self-pay

## 2017-11-06 ENCOUNTER — Ambulatory Visit (HOSPITAL_COMMUNITY): Payer: 59

## 2017-11-06 DIAGNOSIS — R0789 Other chest pain: Secondary | ICD-10-CM | POA: Diagnosis not present

## 2017-11-06 MED ORDER — IOPAMIDOL (ISOVUE-370) INJECTION 76%: 100.0000 mL | Freq: Once | INTRAVENOUS | Status: DC | PRN

## 2017-11-06 MED ORDER — NITROGLYCERIN 0.4 MG SL SUBL
SUBLINGUAL_TABLET | SUBLINGUAL | Status: AC
  Administered 2017-11-06: 0.8 mg via SUBLINGUAL
  Filled 2017-11-06: qty 2

## 2017-11-06 MED ORDER — NITROGLYCERIN 0.4 MG SL SUBL
0.8000 mg | SUBLINGUAL_TABLET | SUBLINGUAL | Status: DC | PRN
  Administered 2017-11-06: 0.8 mg via SUBLINGUAL

## 2017-11-06 MED ORDER — METOPROLOL TARTRATE 5 MG/5ML IV SOLN: 10.0000 mg | INTRAVENOUS | Status: DC | PRN

## 2017-11-06 MED ORDER — METOPROLOL TARTRATE 5 MG/5ML IV SOLN
10.0000 mg | INTRAVENOUS | Status: DC | PRN
  Administered 2017-11-06 (×2): 10 mg via INTRAVENOUS

## 2017-11-06 MED ORDER — METOPROLOL TARTRATE 5 MG/5ML IV SOLN
INTRAVENOUS | Status: AC
  Administered 2017-11-06: 10 mg via INTRAVENOUS
  Filled 2017-11-06: qty 5

## 2017-11-11 ENCOUNTER — Telehealth: Payer: Self-pay | Admitting: Cardiology

## 2017-11-11 DIAGNOSIS — R0789 Other chest pain: Secondary | ICD-10-CM

## 2017-11-11 NOTE — Telephone Encounter (Signed)
Spoke with pt and advised of Dr. Jacalyn Lefevre recommendation. Pt verbalized understanding and instructions provided. Orders placed and message sent to scheduler.

## 2017-11-11 NOTE — Telephone Encounter (Signed)
Spoke with pt who states they were unable to perform the CCTA due to her HR being too high. She states she took the 50 mg lopressor 1 hour prior to procedure and was giving 4 doses of IV metoprolol, but HR stayed in low 80's. Pt calling to see what the next step should be and wondered if she should just contact her orthopedic to see if the arm and chest pain is musculoskeletal as her ECHO was normal. Informed pt that the ECHO looks more at the heart structure/function and CCTA looks for blockages. Pt verbalized understanding. Message routed to MD for further recommendations.

## 2017-11-11 NOTE — Telephone Encounter (Signed)
New Message    Patient is calling because she was unable to have her CT scan because they could not get her heart rate down. She was advised to reach out to cardiology to see if there are any other options. Please call to discuss.

## 2017-11-11 NOTE — Telephone Encounter (Signed)
Arrange stress nuclear study Kirk Ruths

## 2017-11-19 ENCOUNTER — Telehealth (HOSPITAL_COMMUNITY): Payer: Self-pay

## 2017-11-19 NOTE — Telephone Encounter (Signed)
Encounter complete. 

## 2017-11-24 ENCOUNTER — Ambulatory Visit (HOSPITAL_COMMUNITY)
Admission: RE | Admit: 2017-11-24 | Discharge: 2017-11-24 | Disposition: A | Discharge status: Home / Self Care | Payer: 59 | Source: Ambulatory Visit | Attending: Cardiology | Admitting: Cardiology

## 2017-11-24 DIAGNOSIS — R0789 Other chest pain: Secondary | ICD-10-CM | POA: Diagnosis not present

## 2017-11-24 LAB — MYOCARDIAL PERFUSION IMAGING
CHL CUP MPHR: 159 {beats}/min
CHL CUP NUCLEAR SRS: 1
CSEPED: 8 min
CSEPPHR: 162 {beats}/min
Estimated workload: 8.8 METS
Exercise duration (sec): 11 s
LV dias vol: 64 mL (ref 46–106)
LVSYSVOL: 19 mL
NUC STRESS TID: 0.97
Percent HR: 101 %
RPE: 18
Rest HR: 75 {beats}/min
SDS: 1
SSS: 2

## 2017-11-24 MED ORDER — TECHNETIUM TC 99M TETROFOSMIN IV KIT
26.5000 | PACK | Freq: Once | INTRAVENOUS | Status: AC | PRN
  Administered 2017-11-24: 26.5 via INTRAVENOUS
  Filled 2017-11-24: qty 27

## 2017-11-24 MED ORDER — TECHNETIUM TC 99M TETROFOSMIN IV KIT
8.5000 | PACK | Freq: Once | INTRAVENOUS | Status: AC | PRN
  Administered 2017-11-24: 8.5 via INTRAVENOUS
  Filled 2017-11-24: qty 9

## 2017-12-01 ENCOUNTER — Telehealth: Payer: Self-pay | Admitting: Family Medicine

## 2017-12-01 NOTE — Telephone Encounter (Signed)
Dr Redmond School has agreed to take pt on as a new patient

## 2017-12-06 NOTE — Progress Notes (Signed)
HPI: FU chest pain. Also with h/o MVP. Seen 08/28/17 in ER with CP; troponin and chest xray neg; Hgb 15 and K 3.8.  Echo 7/19 with normal LV function; trace AI and mild LAE. Nuclear study 9/19 showed EF 70 and normal perfusion. Since last seen, her symptoms have improved.  She continues to have some pain in her posterior arm, shoulder and lateral chest.  It can occur both with exertion and at rest.  It typically occurs with longer walks but not with running upstairs.  She thinks there is some improvement with supporting her left upper extremity.  There is no dyspnea or syncope.  Current Outpatient Medications  Medication Sig Dispense Refill  . Acetylcarnitine HCl (ACETYL L-CARNITINE PO) Take by mouth daily.    Marland Kitchen alendronate (FOSAMAX) 70 MG tablet Take 70 mg by mouth once a week. Mondays    . aspirin 81 MG chewable tablet Chew 81 mg by mouth daily.     Marland Kitchen b complex vitamins capsule Take 1 capsule by mouth every other day.     . Cholecalciferol (VITAMIN D) 2000 UNITS CAPS Take 2,000 Units by mouth daily.    . cycloSPORINE (RESTASIS) 0.05 % ophthalmic emulsion Place 1 drop into both eyes 2 (two) times daily.    Marland Kitchen latanoprost (XALATAN) 0.005 % ophthalmic solution Place 1 drop into both eyes at bedtime.    . Magnesium 250 MG TABS Take 250 mg by mouth every other day. WEEKLY    . Omega-3 Fatty Acids (FISH OIL PO) Take 1 capsule by mouth daily.    . ranitidine (ZANTAC) 150 MG tablet Take 150 mg by mouth daily.    Merril Abbe 10 MCG TABS vaginal tablet , on tuesdays and fridays  11   No current facility-administered medications for this visit.      Past Medical History:  Diagnosis Date  . Anxiety    very high  . GERD (gastroesophageal reflux disease)   . Glaucoma   . Heart murmur    Pt saw Cardiologist when she was in her 42s, not since.  . IBS (irritable bowel syndrome)   . MVP (mitral valve prolapse)   . Neuropathy 2016   in right leg  . Osteoporosis   . Scoliosis     Past  Surgical History:  Procedure Laterality Date  . CESAREAN SECTION  1988  . SHOULDER ARTHROSCOPY WITH SUBACROMIAL DECOMPRESSION AND OPEN ROTATOR C Right 12/09/2013   Procedure: RIGHT SHOULDER ARTHROSCOPY WITH SUBACROMIAL DECOMPRESSION AND MINI OPEN ROTATOR CUFF REPAIR, AND BICEPS TENDODESIS;  Surgeon: Augustin Schooling, MD;  Location: [facility];  Service: Orthopedics;  Laterality: Right;  . TONSILLECTOMY      Social History   Socioeconomic History  . Marital status: Married    Spouse name: Not on file  . Number of children: 2  . Years of education: Masters  . Highest education level: Not on file  Occupational History  . Occupation: Management/Librarian  Social Needs  . Financial resource strain: Not on file  . Food insecurity:    Worry: Not on file    Inability: Not on file  . Transportation needs:    Medical: Not on file    Non-medical: Not on file  Tobacco Use  . Smoking status: Never Smoker  . Smokeless tobacco: Never Used  Substance and Sexual Activity  . Alcohol use: Yes    Comment: rarely  . Drug use: No  . Sexual activity: Not on file  Lifestyle  .  Physical activity:    Days per week: Not on file    Minutes per session: Not on file  . Stress: Not on file  Relationships  . Social connections:    Talks on phone: Not on file    Gets together: Not on file    Attends religious service: Not on file    Active member of club or organization: Not on file    Attends meetings of clubs or organizations: Not on file    Relationship status: Not on file  . Intimate partner violence:    Fear of current or ex partner: Not on file    Emotionally abused: Not on file    Physically abused: Not on file    Forced sexual activity: Not on file  Other Topics Concern  . Not on file  Social History Narrative   Lives at home with her husband.   Right-handed.   6-8 cups caffeine per day.    Family History  Problem Relation Age of Onset  . Arthritis Mother   . Hyperlipidemia Mother   .  Atrial fibrillation Mother   . Skin cancer Father   . Depression Father   . Parkinson's disease Father   . CAD Father   . Colon cancer Neg Hx   . Colon polyps Neg Hx   . Esophageal cancer Neg Hx   . Stomach cancer Neg Hx   . Rectal cancer Neg Hx     ROS: no fevers or chills, productive cough, hemoptysis, dysphasia, odynophagia, melena, hematochezia, dysuria, hematuria, rash, seizure activity, orthopnea, PND, pedal edema, claudication. Remaining systems are negative.  Physical Exam: Well-developed well-nourished in no acute distress.  Skin is warm and dry.  HEENT is normal.  Neck is supple.  Chest is clear to auscultation with normal expansion.  Cardiovascular exam is regular rate and rhythm.  Abdominal exam nontender or distended. No masses palpated. Extremities show no edema. neuro grossly intact  A/P  1 chest pain-symptoms are somewhat atypical.  She now feels that supporting her left upper extremity with ambulation improves her symptoms.  Question musculoskeletal.  Cardiac CT was not performed as there was difficulty slowing her heart rate.  Nuclear study showed no ischemia.  Only option from a cardiac standpoint at this time would likely be cardiac catheterization.  However I am not convinced she needs this for now.  We can consider in the future if symptoms worsen.  She is in agreement.  She wonders whether this may be musculoskeletal.  She will see [place] back in 6 months or contact us if symptoms worsen.  2 mitral valve prolapse-most recent echocardiogram did not show mitral valve prolapse.  Kirk Ruths, MD

## 2017-12-11 ENCOUNTER — Ambulatory Visit: Payer: 59 | Admitting: Cardiology

## 2017-12-11 ENCOUNTER — Encounter: Payer: Self-pay | Admitting: Cardiology

## 2017-12-11 VITALS — BP 111/70 | HR 80 | Ht 63.0 in | Wt 141.4 lb

## 2017-12-11 DIAGNOSIS — R0789 Other chest pain: Secondary | ICD-10-CM

## 2017-12-11 DIAGNOSIS — I341 Nonrheumatic mitral (valve) prolapse: Secondary | ICD-10-CM | POA: Diagnosis not present

## 2017-12-11 NOTE — Patient Instructions (Signed)
Medication Instructions:  NO CHANGE If you need a refill on your cardiac medications before your next appointment, please call your pharmacy.   Lab work: If you have labs (blood work) drawn today and your tests are completely normal, you will receive your results only by: Marland Kitchen MyChart Message (if you have MyChart) OR . A paper copy in the mail If you have any lab test that is abnormal or we need to change your treatment, we will call you to review the results.  Follow-Up: At [facility], you and your health needs are our priority.  As part of our continuing mission to provide you with exceptional heart care, we have created designated Provider Care Teams.  These Care Teams include your primary Cardiologist (physician) and Advanced Practice Providers (APPs -  Physician Assistants and Nurse Practitioners) who all work together to provide you with the care you need, when you need it. You will need a follow up appointment in 6 months.  Please call our office 2 months in advance to schedule this appointment.  You may see DR Stanford Breed or one of the following Advanced Practice Providers on your designated Care Team:   Kerin Ransom, PA-C Roby Lofts, [place] . Sande Rives, PA-C

## 2018-02-15 ENCOUNTER — Ambulatory Visit: Payer: 59 | Admitting: Family Medicine

## 2018-02-15 ENCOUNTER — Encounter: Payer: Self-pay | Admitting: Family Medicine

## 2018-02-15 VITALS — BP 104/72 | HR 90 | Temp 98.1°F | Ht 63.0 in | Wt 142.2 lb

## 2018-02-15 DIAGNOSIS — M479 Spondylosis, unspecified: Secondary | ICD-10-CM

## 2018-02-15 DIAGNOSIS — Z23 Encounter for immunization: Secondary | ICD-10-CM | POA: Diagnosis not present

## 2018-02-15 DIAGNOSIS — M419 Scoliosis, unspecified: Secondary | ICD-10-CM

## 2018-02-15 DIAGNOSIS — K219 Gastro-esophageal reflux disease without esophagitis: Secondary | ICD-10-CM | POA: Diagnosis not present

## 2018-02-15 DIAGNOSIS — K829 Disease of gallbladder, unspecified: Secondary | ICD-10-CM

## 2018-02-15 DIAGNOSIS — E739 Lactose intolerance, unspecified: Secondary | ICD-10-CM

## 2018-02-15 DIAGNOSIS — M810 Age-related osteoporosis without current pathological fracture: Secondary | ICD-10-CM

## 2018-02-15 DIAGNOSIS — Z8639 Personal history of other endocrine, nutritional and metabolic disease: Secondary | ICD-10-CM | POA: Insufficient documentation

## 2018-02-15 NOTE — Progress Notes (Signed)
   Subjective:    Patient ID: [name], female    DOB: 11/25/1955, 62 y.o.   MRN: [id number]  HPI She is here for a get acquainted visit.  Her physician is getting ready to retire.  She does have a history of reflux disease and apparently did recently have an endoscopy which was negative.  Ranitidine has worked but she is switching to a different preparation due to contamination of the Zantac.  She also has a history consistent with lactose intolerance; she notes difficulty with milk and milk products causing bloating and gas.  There is also a previous history of gallbladder disorder.  She describes having a HIDA scan which was apparently positive however she is having no trouble other than when she drinks milk or milk products.  She is also taking Fosamax.  She will need to have another DEXA scan done in early spring.  She also states that she has a history of scoliosis and some arthritic changes.  Presently she is having no difficulty with that.  She does have a history of vitamin D deficiency and continues on 2000 international units daily.  She did have a Cologuard this year which was apparently negative.   Review of Systems     Objective:   Physical Exam Alert and in no distress otherwise not examined      Assessment & Plan:  Gastroesophageal reflux disease without esophagitis  Osteoporosis, unspecified osteoporosis type, unspecified pathological fracture presence - Plan: DG Bone Density  Spondylosis  Disorder of gallbladder  Lactose intolerance  Need for shingles vaccine - Plan: Varicella-zoster vaccine IM (Shingrix)  History of vitamin D deficiency  Scoliosis, unspecified scoliosis type, unspecified spinal region She will continue on her present medication regimen.  Did discuss treating her lactose intolerance with Lactaid to see if that will help with her GI distress symptoms.  She will set up for complete exam in roughly 6 months.

## 2018-02-15 NOTE — Patient Instructions (Signed)
The next time you want to drink milk or get ice cream use Lactaid and see what that does.

## 2018-02-26 NOTE — Progress Notes (Signed)
Other

## 2018-03-04 ENCOUNTER — Encounter: Payer: Self-pay | Admitting: Family Medicine

## 2018-03-09 ENCOUNTER — Encounter: Payer: Self-pay | Admitting: Family Medicine

## 2018-03-11 ENCOUNTER — Encounter: Payer: Self-pay | Admitting: Family Medicine

## 2018-03-16 DIAGNOSIS — Z86018 Personal history of other benign neoplasm: Secondary | ICD-10-CM | POA: Diagnosis not present

## 2018-03-16 DIAGNOSIS — L820 Inflamed seborrheic keratosis: Secondary | ICD-10-CM | POA: Diagnosis not present

## 2018-03-16 DIAGNOSIS — D225 Melanocytic nevi of trunk: Secondary | ICD-10-CM | POA: Diagnosis not present

## 2018-03-16 DIAGNOSIS — L814 Other melanin hyperpigmentation: Secondary | ICD-10-CM | POA: Diagnosis not present

## 2018-03-29 ENCOUNTER — Ambulatory Visit
Admission: RE | Admit: 2018-03-29 | Discharge: 2018-03-29 | Disposition: A | Discharge status: Home / Self Care | Payer: 59 | Source: Ambulatory Visit | Attending: Family Medicine | Admitting: Family Medicine

## 2018-03-29 ENCOUNTER — Ambulatory Visit: Payer: 59 | Admitting: Family Medicine

## 2018-03-29 ENCOUNTER — Encounter: Payer: Self-pay | Admitting: Family Medicine

## 2018-03-29 VITALS — BP 120/76 | HR 77 | Temp 97.9°F | Wt 144.8 lb

## 2018-03-29 DIAGNOSIS — R0781 Pleurodynia: Secondary | ICD-10-CM | POA: Diagnosis not present

## 2018-03-29 DIAGNOSIS — M810 Age-related osteoporosis without current pathological fracture: Secondary | ICD-10-CM | POA: Diagnosis not present

## 2018-03-29 NOTE — Progress Notes (Signed)
   Subjective:    Patient ID: [name], female    DOB: 04-30-1955, 63 y.o.   MRN: [id number]  HPI She complains of having left lateral rib pain after leaning to the left side and feeling a popping sensation 2 days ago.  No complaint of shortness of breath, abdominal pain.  Motion and taking a deep breath does increase the pain.  She does have a history of osteoporosis and has been on Fosamax for the last 2 years.  A DEXA scan was ordered but was not done due to technical issues.  The original scan was done at [facility].   Review of Systems     Objective:   Physical Exam Alert and complaining of left lateral rib pain.  Palpation of that area did elicit a palpable popping sensation.  Lungs are clear to auscultation.  Abdominal exam shows no masses or tenderness.       Assessment & Plan:  Osteoporosis, unspecified osteoporosis type, unspecified pathological fracture presence - Plan: DG Chest 2 View  Rib pain on left side - Plan: DG Chest 2 View Clinically this is a rib fracture especially with an underlying history of osteoporosis. I will pursue getting the report from orthopedics concerning her T12 fracture and also pursue getting a DEXA scan.  She is a candidate for Prolia.

## 2018-04-19 ENCOUNTER — Other Ambulatory Visit (INDEPENDENT_AMBULATORY_CARE_PROVIDER_SITE_OTHER): Payer: 59

## 2018-04-19 DIAGNOSIS — Z23 Encounter for immunization: Secondary | ICD-10-CM | POA: Diagnosis not present

## 2018-04-22 ENCOUNTER — Ambulatory Visit: Payer: 59 | Admitting: Obstetrics & Gynecology

## 2018-04-28 ENCOUNTER — Ambulatory Visit: Payer: 59 | Admitting: Obstetrics & Gynecology

## 2018-04-29 DIAGNOSIS — H40013 Open angle with borderline findings, low risk, bilateral: Secondary | ICD-10-CM | POA: Diagnosis not present

## 2018-04-29 DIAGNOSIS — H04123 Dry eye syndrome of bilateral lacrimal glands: Secondary | ICD-10-CM | POA: Diagnosis not present

## 2018-04-29 DIAGNOSIS — H40053 Ocular hypertension, bilateral: Secondary | ICD-10-CM | POA: Diagnosis not present

## 2018-05-07 ENCOUNTER — Encounter: Payer: Self-pay | Admitting: Family Medicine

## 2018-05-10 DIAGNOSIS — Z1231 Encounter for screening mammogram for malignant neoplasm of breast: Secondary | ICD-10-CM | POA: Diagnosis not present

## 2018-05-10 LAB — HM MAMMOGRAPHY

## 2018-05-19 ENCOUNTER — Other Ambulatory Visit: Payer: Self-pay

## 2018-05-19 MED ORDER — YUVAFEM 10 MCG VA TABS: ORAL_TABLET | VAGINAL | 11 refills | Status: DC

## 2018-05-19 NOTE — Telephone Encounter (Signed)
Patient lmom stating she needs a refill on the pending medication. She stated that it expired.

## 2018-05-19 NOTE — Telephone Encounter (Signed)
Pt is requesting to fill yuvafem. Please advise [facility]

## 2018-06-03 ENCOUNTER — Encounter: Payer: Self-pay | Admitting: Family Medicine

## 2018-06-03 ENCOUNTER — Ambulatory Visit: Payer: 59 | Admitting: Obstetrics & Gynecology

## 2018-06-04 MED ORDER — ALENDRONATE SODIUM 70 MG PO TABS: 70.0000 mg | ORAL_TABLET | ORAL | 0 refills | Status: DC

## 2018-06-11 ENCOUNTER — Other Ambulatory Visit: Payer: 59

## 2018-08-03 ENCOUNTER — Telehealth: Payer: Self-pay | Admitting: *Deleted

## 2018-08-03 NOTE — Telephone Encounter (Signed)
A message was left, re: follow up visit. 

## 2018-08-16 ENCOUNTER — Other Ambulatory Visit: Payer: Self-pay

## 2018-08-16 ENCOUNTER — Ambulatory Visit
Admission: RE | Admit: 2018-08-16 | Discharge: 2018-08-16 | Disposition: A | Discharge status: Home / Self Care | Payer: 59 | Source: Ambulatory Visit | Attending: Family Medicine | Admitting: Family Medicine

## 2018-08-16 DIAGNOSIS — M810 Age-related osteoporosis without current pathological fracture: Secondary | ICD-10-CM

## 2018-08-22 ENCOUNTER — Other Ambulatory Visit: Payer: Self-pay | Admitting: Family Medicine

## 2018-08-23 ENCOUNTER — Other Ambulatory Visit: Payer: Self-pay

## 2018-08-23 ENCOUNTER — Encounter: Payer: Self-pay | Admitting: Family Medicine

## 2018-08-23 ENCOUNTER — Ambulatory Visit: Payer: 59 | Admitting: Family Medicine

## 2018-08-23 VITALS — HR 84 | Temp 96.7°F | Wt 142.0 lb

## 2018-08-23 DIAGNOSIS — R0789 Other chest pain: Secondary | ICD-10-CM | POA: Diagnosis not present

## 2018-08-23 MED ORDER — ALBUTEROL SULFATE HFA 108 (90 BASE) MCG/ACT IN AERS: 2.0000 | INHALATION_SPRAY | Freq: Four times a day (QID) | RESPIRATORY_TRACT | 2 refills | Status: DC | PRN

## 2018-08-23 MED ORDER — CLARITHROMYCIN 500 MG PO TABS: 500.0000 mg | ORAL_TABLET | Freq: Two times a day (BID) | ORAL | 0 refills | Status: DC

## 2018-08-23 NOTE — Progress Notes (Signed)
   Subjective:    Patient ID: [name], female    DOB: [birth year], 63 y.o.   MRN: [id number]  HPI Documentation for virtual telephone encounter. Documentation for virtual audio and video telecommunications through doximity encounter: The patient was located at home. The provider was located in the office. The patient did consent to this visit and is aware of possible charges through their insurance for this visit. The other persons participating in this telemedicine service were none. Time spent on call was 5 minutes and in review of previous records >10 minutes total. This virtual service is not related to other E/M service within previous 7 days. She has a 49-month history of intermittent cough as well as some chest tightness.  No fever, chills, sore throat, earache.  She has been checking her pulse ox at home and apparently it is in the normal range.  She does have a previous history of difficulty with reactive airways disease and states that albuterol did help that.  She does not smoke.    Review of Systems     Objective:   Physical Exam Alert and in no distress.  Her respiratory rate appears normal       Assessment & Plan:  Chest tightness - Plan: I think it is reasonable to go ahead and treat her with an antibiotic and also give albuterol to help with the cough.  She will call if she continues have difficulty when she finishes the antibiotic

## 2018-08-26 ENCOUNTER — Ambulatory Visit: Payer: 59 | Admitting: Obstetrics & Gynecology

## 2018-09-30 ENCOUNTER — Ambulatory Visit: Payer: 59 | Admitting: Obstetrics & Gynecology

## 2018-09-30 ENCOUNTER — Encounter: Payer: Self-pay | Admitting: Family Medicine

## 2018-10-01 ENCOUNTER — Encounter: Payer: Self-pay | Admitting: Family Medicine

## 2018-10-01 ENCOUNTER — Other Ambulatory Visit: Payer: Self-pay

## 2018-10-01 ENCOUNTER — Ambulatory Visit: Payer: 59 | Admitting: Family Medicine

## 2018-10-01 VITALS — Temp 97.7°F | Wt 142.0 lb

## 2018-10-01 DIAGNOSIS — K219 Gastro-esophageal reflux disease without esophagitis: Secondary | ICD-10-CM

## 2018-10-01 DIAGNOSIS — R059 Cough, unspecified: Secondary | ICD-10-CM

## 2018-10-01 DIAGNOSIS — R05 Cough: Secondary | ICD-10-CM | POA: Diagnosis not present

## 2018-10-01 NOTE — Progress Notes (Signed)
   Subjective:    Patient ID: [name], female    DOB: 1956-01-10, 63 y.o.   MRN: [id number]  HPI Documentation for virtual telephone encounter.  Documentation for virtual audio and video telecommunications through [facility] encounter: The patient was located at home. The provider was located in the office. The patient did consent to this visit and is aware of possible charges through their insurance for this visit. The other persons participating in this telemedicine service were none. Time spent on call was 5 minutes and in review of previous records >15 minutes total. This virtual service is not related to other E/M service within previous 7 days. She is still having difficulty with the cough.  She has been tried on antibiotics as well as albuterol.  She states that her pulse ox has been doing well.  She tends to cough more when she talks a lot.  She does have a previous history of reflux and has been using Gaviscon.  Prior to that she did try an H2 blocker.  She states that when she lies down, it actually helps with her coughing.  She has no difficulty with food causing any symptoms.  Review of Systems     Objective:   Physical Exam  Alert and in no distress otherwise not examined      Assessment & Plan:  Cough - Plan: DG Chest 2 View, think it is reasonable to rule out any major problems.  Gastroesophageal reflux disease without esophagitis - Plan: Since she really did not respond to the inhaler or the antibiotic, reflux is certainly a reasonable cause for all of this although she states the symptoms are different than her normal reflux symptoms.  She is to use 40 mg of Nexium for the next 10 to 14 days to see if that will clear up her symptoms.  If not might need to refer to pulmonology.

## 2018-10-04 ENCOUNTER — Ambulatory Visit
Admission: RE | Admit: 2018-10-04 | Discharge: 2018-10-04 | Disposition: A | Discharge status: Home / Self Care | Payer: 59 | Source: Ambulatory Visit | Attending: Family Medicine | Admitting: Family Medicine

## 2018-10-04 ENCOUNTER — Other Ambulatory Visit: Payer: Self-pay

## 2018-10-04 DIAGNOSIS — R05 Cough: Secondary | ICD-10-CM

## 2018-10-04 DIAGNOSIS — R059 Cough, unspecified: Secondary | ICD-10-CM

## 2018-11-16 ENCOUNTER — Other Ambulatory Visit: Payer: Self-pay | Admitting: Family Medicine

## 2018-11-18 ENCOUNTER — Ambulatory Visit: Payer: 59 | Admitting: Obstetrics & Gynecology

## 2018-11-30 IMAGING — MR MR HEAD W/O CM
8 of 11 series · 38 of 48 positions shown · non-contrast
Comparison: None.

CLINICAL DATA: Vertigo and ataxia. Dizziness with movement.
Possible inner ear infection with recent antibiotics.

EXAM:
MRI HEAD WITHOUT CONTRAST
TECHNIQUE: Multiplanar, multiecho pulse sequences of the brain and surrounding
structures were obtained without intravenous contrast.

[Series 3: DWI · axial · 3.0mm · 1.09mm/px · z∈[-59,+100]mm · 11 of 108 slices shown (1 of 4)]
[im 1/108]
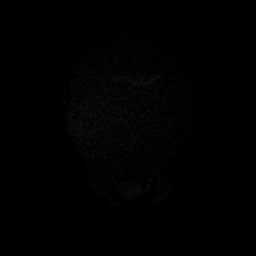
[im 11/108]
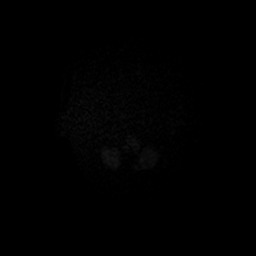
[im 22/108]
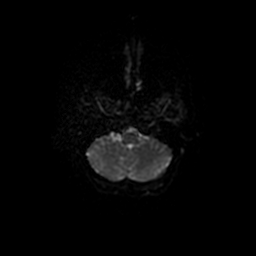
[im 33/108]
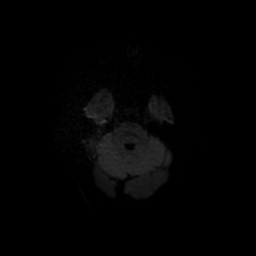
[im 43/108]
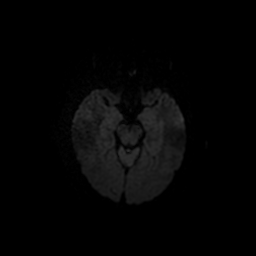
[im 54/108]
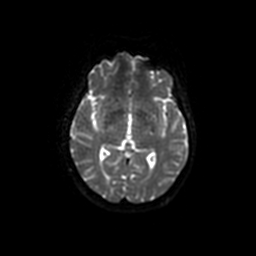
[im 65/108]
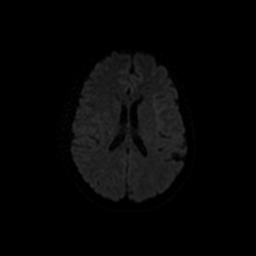
[im 75/108]
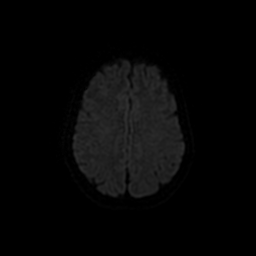
[im 86/108]
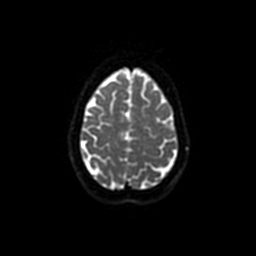
[im 97/108]
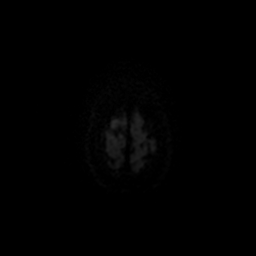
[im 108/108]
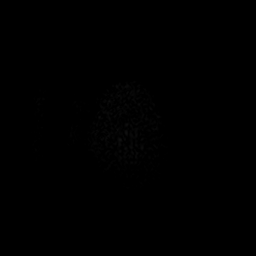

[Series 5: DWI · coronal · 5.0mm · 1.09mm/px · 7 of 71 slices shown (2 of 4)]
[im 1/71]
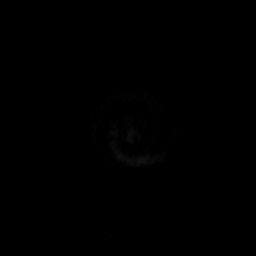
[im 12/71]
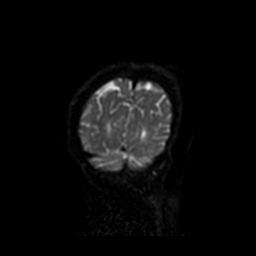
[im 24/71]
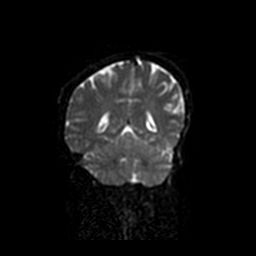
[im 36/71]
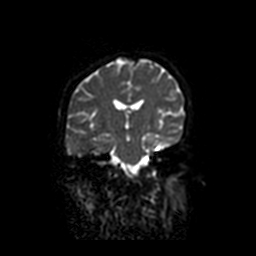
[im 47/71]
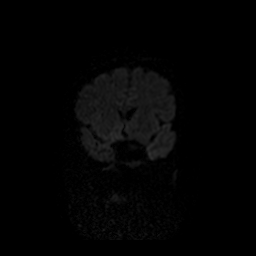
[im 59/71]
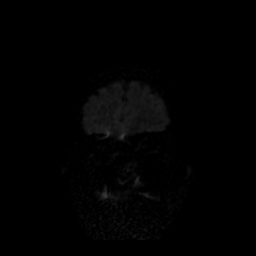
[im 71/71]
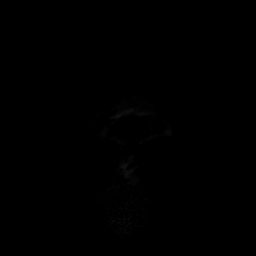

[Series 6: T2 · axial · 5.0mm · 0.43mm/px · z∈[-54,+107]mm · 2 of 24 slices shown (1 of 3)]
[im 1/24]
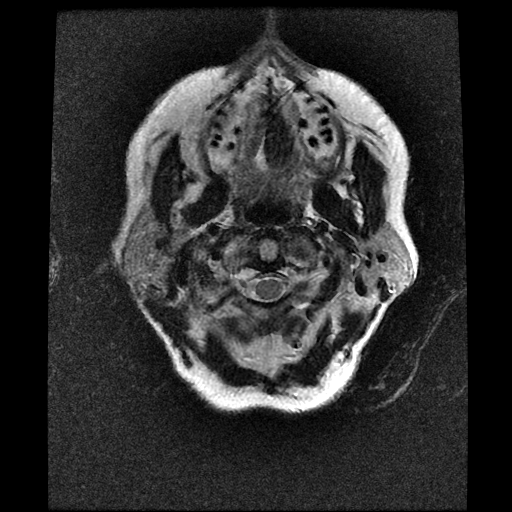
[im 24/24]
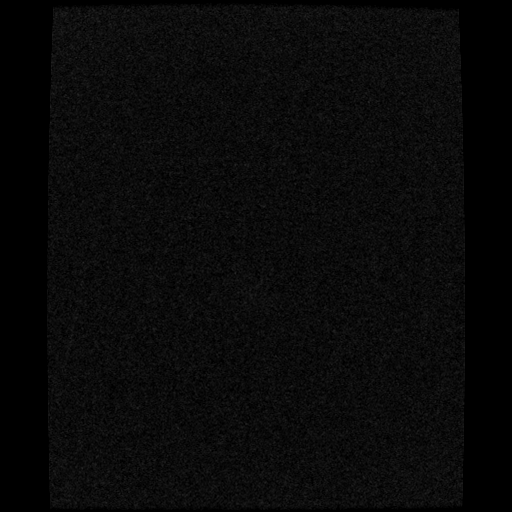

[Series 7: FLAIR · axial · 5.0mm · 0.43mm/px · z∈[-54,+107]mm · 3 of 28 slices shown]
[im 1/28]
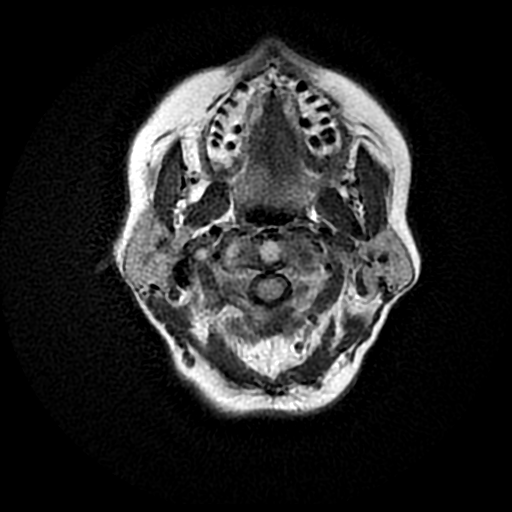
[im 14/28]
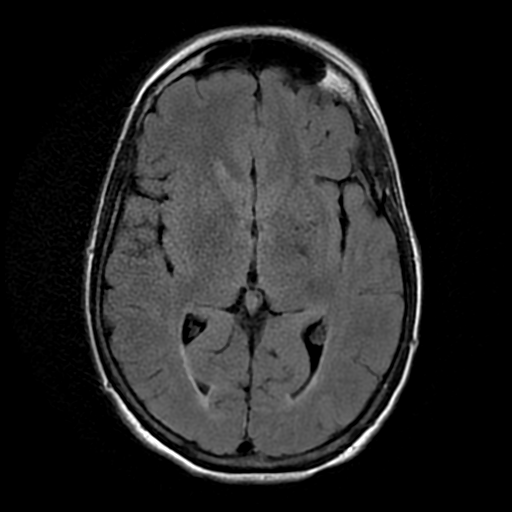
[im 28/28]
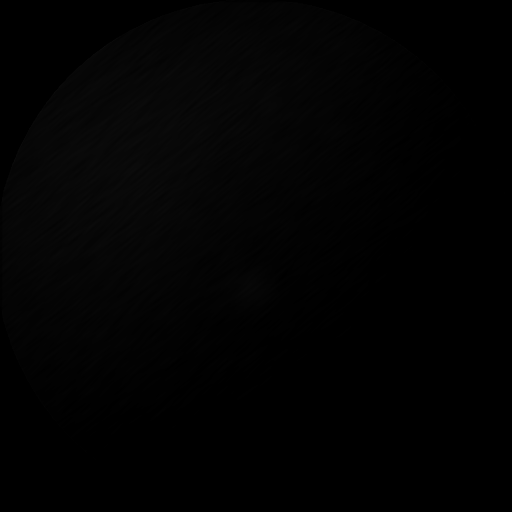

[Series 10: T2 · coronal · 5.0mm · 0.45mm/px · 3 of 28 slices shown (2 of 3)]
[im 1/28]
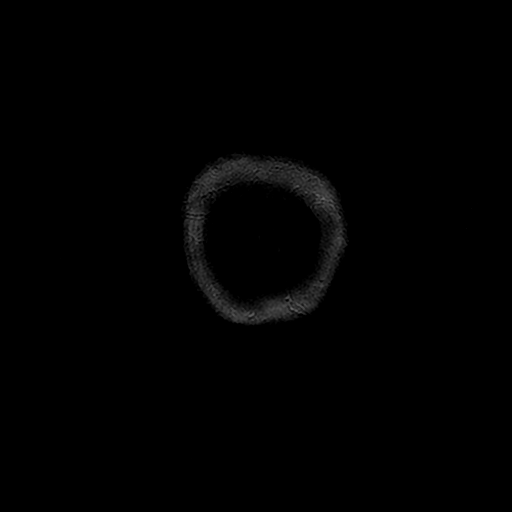
[im 14/28]
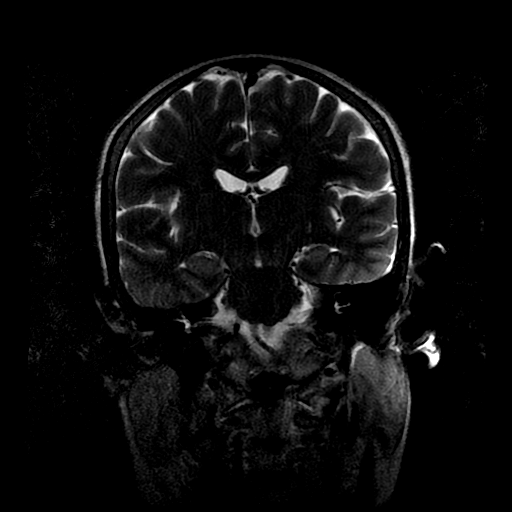
[im 28/28]
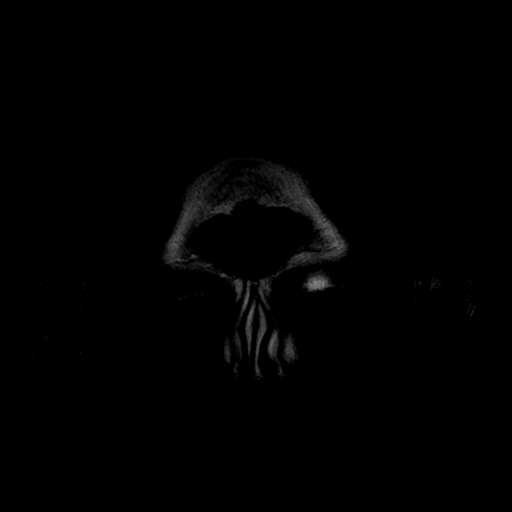

[Series 11: T2 · axial · 5.0mm · 0.43mm/px · z∈[-54,+107]mm · 3 of 28 slices shown (3 of 3)]
[im 1/28]
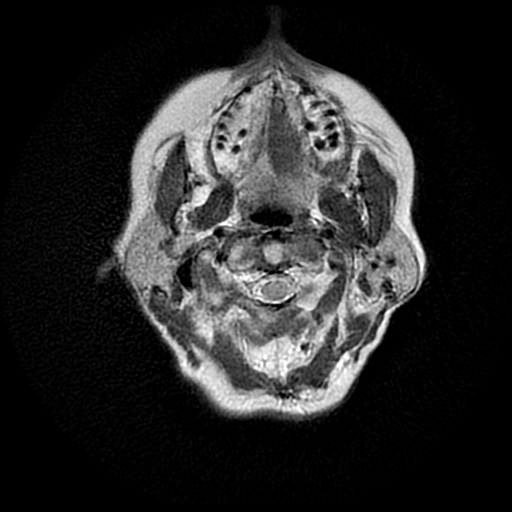
[im 14/28]
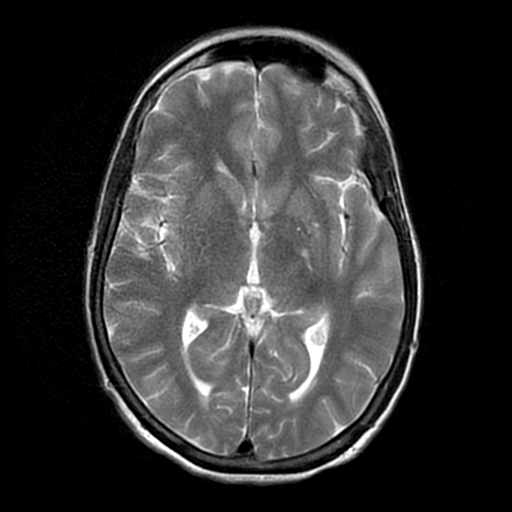
[im 28/28]
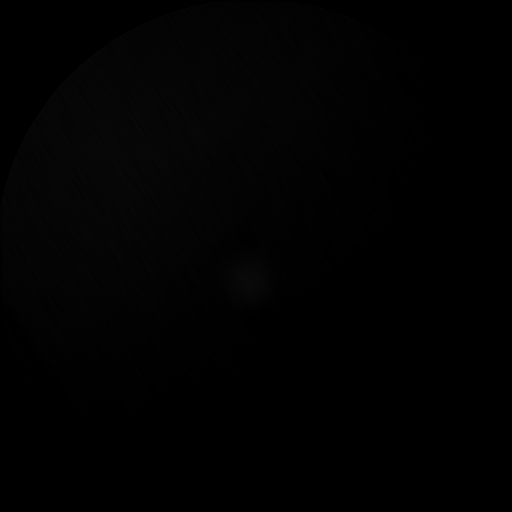

[Series 300: DWI · axial · 3.0mm · 1.09mm/px · z∈[-59,+100]mm · 5 of 54 slices shown (3 of 4)]
[im 1/54]
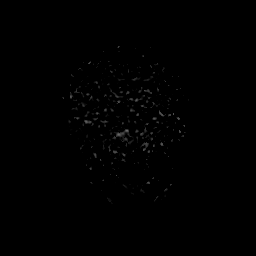
[im 14/54]
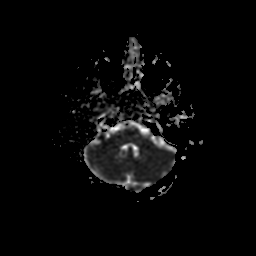
[im 27/54]
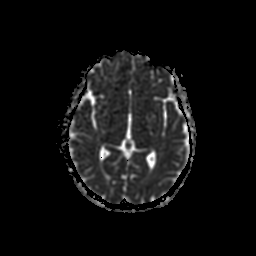
[im 40/54]
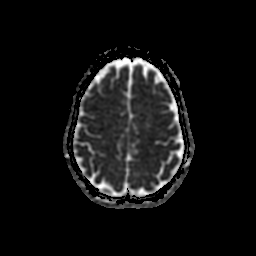
[im 54/54]
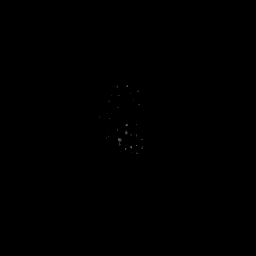

[Series 500: DWI · coronal · 5.0mm · 1.09mm/px · 4 of 36 slices shown (4 of 4)]
[im 1/36]
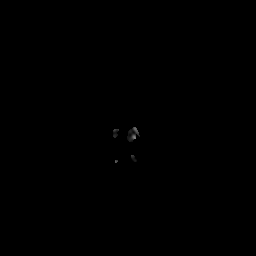
[im 12/36]
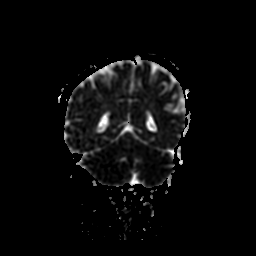
[im 24/36]
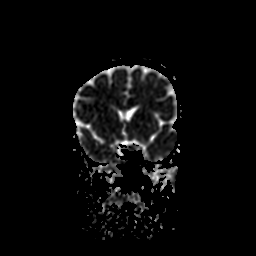
[im 36/36]
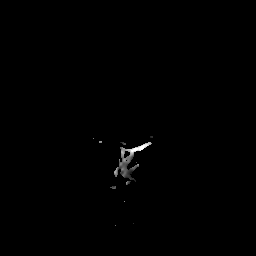

[38 of 48 positions shown; findings below may reference images not displayed]

FINDINGS: Brain: No acute infarction, hemorrhage, hydrocephalus, extra-axial
collection or mass lesion. No findings in the CP angle cisterns or
brainstem to explain dizziness. Minimal FLAIR hyperintensity around
the lateral ventricles. No demyelinating pattern. Grossly symmetric
signal in the labyrinth.

Vascular: Major flow voids are preserved, including in the
vertebrobasilar system.

Skull and upper cervical spine: Negative

Sinuses/Orbits: Negative.  No mastoiditis or middle ear fluid.
IMPRESSION: Negative study.  No explanation for symptoms.

## 2018-12-02 ENCOUNTER — Encounter: Payer: Self-pay | Admitting: Obstetrics & Gynecology

## 2018-12-02 ENCOUNTER — Ambulatory Visit (INDEPENDENT_AMBULATORY_CARE_PROVIDER_SITE_OTHER): Payer: 59 | Admitting: Obstetrics & Gynecology

## 2018-12-02 ENCOUNTER — Other Ambulatory Visit: Payer: Self-pay

## 2018-12-02 VITALS — BP 120/70 | Ht 63.25 in | Wt 145.0 lb

## 2018-12-02 DIAGNOSIS — Z780 Asymptomatic menopausal state: Secondary | ICD-10-CM

## 2018-12-02 DIAGNOSIS — M810 Age-related osteoporosis without current pathological fracture: Secondary | ICD-10-CM | POA: Diagnosis not present

## 2018-12-02 DIAGNOSIS — Z01419 Encounter for gynecological examination (general) (routine) without abnormal findings: Secondary | ICD-10-CM

## 2018-12-02 MED ORDER — YUVAFEM 10 MCG VA TABS: ORAL_TABLET | VAGINAL | 4 refills | Status: DC

## 2018-12-02 NOTE — Progress Notes (Signed)
JAELEEN BREITNER 1955-10-10 [id number]   History:    63 y.o. G2P2L2 Married  RP:  New (>3 yrs) patient presenting for annual gyn exam   HPI: Postmenopause, well on no HRT.  No PMB.  No pelvic pain.  Occasionally sexually active.  On Vagifem twice a week.  Urine/BMs normal.  Breasts normal.  BMI 25.48.  Walking 30 min every day.  Followed by Dr [facility] on Fosamax x 2 yrs.  Last BD improved with T-Score of -2.4.  Health labs with Dr [facility].  Past medical history,surgical history, family history and social history were all reviewed and documented in the EPIC chart.  Gynecologic History No LMP recorded (lmp unknown). Patient is postmenopausal. Contraception: post menopausal status Last Pap: >3 yrs. Results were: normal per patient Last mammogram: 05/2018. Results were: Negative Bone Density: 08/2018 Osteopenia T-Score -2.4 on Fosamax x 2 yrs.  H/O Osteoporosis with h/o of vertebral compression fracture. Colonoscopy: 2019  Obstetric History OB History  Gravida Para Term Preterm AB Living  2 2       2   SAB TAB Ectopic Multiple Live Births               # Outcome Date GA Lbr Len/2nd Weight Sex Delivery Anes PTL Lv  2 Para           1 Para              ROS: A ROS was performed and pertinent positives and negatives are included in the history.  GENERAL: No fevers or chills. HEENT: No change in vision, no earache, sore throat or sinus congestion. NECK: No pain or stiffness. CARDIOVASCULAR: No chest pain or pressure. No palpitations. PULMONARY: No shortness of breath, cough or wheeze. GASTROINTESTINAL: No abdominal pain, nausea, vomiting or diarrhea, melena or bright red blood per rectum. GENITOURINARY: No urinary frequency, urgency, hesitancy or dysuria. MUSCULOSKELETAL: No joint or muscle pain, no back pain, no recent trauma. DERMATOLOGIC: No rash, no itching, no lesions. ENDOCRINE: No polyuria, polydipsia, no heat or cold intolerance. No recent change in weight.  HEMATOLOGICAL: No anemia or easy bruising or bleeding. NEUROLOGIC: No headache, seizures, numbness, tingling or weakness. PSYCHIATRIC: No depression, no loss of interest in normal activity or change in sleep pattern.     Exam:   Ht 5' 3.25" (1.607 m)   Wt 145 lb (65.8 kg)   LMP  (LMP Unknown)   BMI 25.48 kg/m   Body mass index is 25.48 kg/m.  General appearance : Well developed well nourished female. No acute distress HEENT: Eyes: no retinal hemorrhage or exudates,  Neck supple, trachea midline, no carotid bruits, no thyroidmegaly Lungs: Clear to auscultation, no rhonchi or wheezes, or rib retractions  Heart: Regular rate and rhythm, no murmurs or gallops Breast:Examined in sitting and supine position were symmetrical in appearance, no palpable masses or tenderness,  no skin retraction, no nipple inversion, no nipple discharge, no skin discoloration, no axillary or supraclavicular lymphadenopathy Abdomen: no palpable masses or tenderness, no rebound or guarding Extremities: no edema or skin discoloration or tenderness  Pelvic: Vulva: Normal             Vagina: No gross lesions or discharge  Cervix: No gross lesions or discharge.  Pap reflex done.  Uterus  AV, normal size, shape and consistency, non-tender and mobile  Adnexa  Without masses or tenderness  Anus: Normal   Assessment/Plan:  63 y.o. female for annual exam   1. Encounter for  routine gynecological examination with Papanicolaou smear of cervix Normal gynecologic exam in menopause.  Pap reflex done.  Breast exam normal.  Screening mammogram March 2020 was negative.  Colonoscopy in 2019.  Good body mass index at 25.48.  Continue with daily walking, recommend weight lifting every 2 days.  Continue with healthy nutrition.  Health labs with Dr. [facility].  2. Postmenopause Postmenopausal, well on no systemic hormone replacement therapy.  No postmenopausal bleeding.  Using Yuvafem tablets twice a week for atrophy of menopause.   No contraindication to continue.  Prescription sent to pharmacy.  3. Osteoporosis without current pathological fracture, unspecified osteoporosis type History of osteoporosis with history of vertebral compression fracture per patient.  On Fosamax for 2 years.  Bone density was improved June 2020 showing osteopenia with a T score of -2.4.  Patient will follow up with Dr. [facility] to decide on whether to continue Fosamax or switch to Prolia.  Recommend vitamin D supplements, calcium intake of 1200 mg including nutritional and supplemental, continue with regular weightbearing physical activities.  Other orders - vitamin C (ASCORBIC ACID) 500 MG tablet; Take 500 mg by mouth daily. - YUVAFEM 10 MCG TABS vaginal tablet; , on tuesdays and fridays  Princess Bruins MD, 3:15 PM 12/02/2018

## 2018-12-03 ENCOUNTER — Encounter: Payer: Self-pay | Admitting: Obstetrics & Gynecology

## 2018-12-03 NOTE — Patient Instructions (Signed)
1. Encounter for routine gynecological examination with Papanicolaou smear of cervix Normal gynecologic exam in menopause.  Pap reflex done.  Breast exam normal.  Screening mammogram March 2020 was negative.  Colonoscopy in 2019.  Good body mass index at 25.48.  Continue with daily walking, recommend weight lifting every 2 days.  Continue with healthy nutrition.  Health labs with Dr. [facility].  2. Postmenopause Postmenopausal, well on no systemic hormone replacement therapy.  No postmenopausal bleeding.  Using Yuvafem tablets twice a week for atrophy of menopause.  No contraindication to continue.  Prescription sent to pharmacy.  3. Osteoporosis without current pathological fracture, unspecified osteoporosis type History of osteoporosis with history of vertebral compression fracture per patient.  On Fosamax for 2 years.  Bone density was improved June 2020 showing osteopenia with a T score of -2.4.  Patient will follow up with Dr. [facility]mond [facility] to decide on whether to continue Fosamax or switch to Prolia.  Recommend vitamin D supplements, calcium intake of 1200 mg including nutritional and supplemental, continue with regular weightbearing physical activities.  Other orders - vitamin C (ASCORBIC ACID) 500 MG tablet; Take 500 mg by mouth daily. - YUVAFEM 10 MCG TABS vaginal tablet; , on tuesdays and fridays  [name], it was a pleasure seeing you today!  I will inform you of your results as soon as they are available.

## 2018-12-06 LAB — PAP IG W/ RFLX HPV ASCU

## 2019-02-21 ENCOUNTER — Encounter: Payer: Self-pay | Admitting: Family Medicine

## 2019-02-21 ENCOUNTER — Other Ambulatory Visit: Payer: Self-pay

## 2019-02-21 MED ORDER — ALENDRONATE SODIUM 70 MG PO TABS: 70.0000 mg | ORAL_TABLET | ORAL | 2 refills | Status: DC

## 2019-03-01 ENCOUNTER — Encounter: Payer: Self-pay | Admitting: Family Medicine

## 2019-03-01 ENCOUNTER — Ambulatory Visit: Payer: 59 | Admitting: Family Medicine

## 2019-03-01 ENCOUNTER — Other Ambulatory Visit: Payer: Self-pay

## 2019-03-01 VITALS — BP 120/78 | HR 86 | Temp 97.8°F | Ht 63.0 in | Wt 147.2 lb

## 2019-03-01 DIAGNOSIS — M810 Age-related osteoporosis without current pathological fracture: Secondary | ICD-10-CM

## 2019-03-01 DIAGNOSIS — E739 Lactose intolerance, unspecified: Secondary | ICD-10-CM | POA: Diagnosis not present

## 2019-03-01 DIAGNOSIS — K219 Gastro-esophageal reflux disease without esophagitis: Secondary | ICD-10-CM | POA: Diagnosis not present

## 2019-03-01 DIAGNOSIS — Z0000 Encounter for general adult medical examination without abnormal findings: Secondary | ICD-10-CM

## 2019-03-01 DIAGNOSIS — R946 Abnormal results of thyroid function studies: Secondary | ICD-10-CM

## 2019-03-01 DIAGNOSIS — K829 Disease of gallbladder, unspecified: Secondary | ICD-10-CM

## 2019-03-01 DIAGNOSIS — Z8639 Personal history of other endocrine, nutritional and metabolic disease: Secondary | ICD-10-CM

## 2019-03-01 LAB — POCT URINALYSIS DIP (PROADVANTAGE DEVICE)
Bilirubin, UA: NEGATIVE
Blood, UA: NEGATIVE
Glucose, UA: NEGATIVE mg/dL
Ketones, POC UA: NEGATIVE mg/dL
Leukocytes, UA: NEGATIVE
Nitrite, UA: NEGATIVE
Protein Ur, POC: NEGATIVE mg/dL
Specific Gravity, Urine: 1.015
Urobilinogen, Ur: 0.2
pH, UA: 6 (ref 5.0–8.0)

## 2019-03-01 NOTE — Progress Notes (Signed)
o

## 2019-03-01 NOTE — Progress Notes (Signed)
   Subjective:    Patient ID: [name], female    DOB: Dec 01, 1955, 63 y.o.   MRN: [id number]  HPI She is here for complete examination.  She has no particular concerns or complaints.  She continues on Fosamax for treatment of osteoporosis.  Has had a recent DEXA scan.  She continues on estrogen replacement vaginally for atrophy.  She also takes Pepcid on a daily basis for reflux symptoms.  She has tried cutting back on but was unable to.  She has a remote history of abnormal thyroid function including having a radioactive iodine uptake test which was negative.  She has a history of gallbladder disease and apparently the HIDA exam with negative.  Review of the record also indicates vitamin D deficiency.  She is on a multivitamin.  Family and social history as well as health maintenance and immunizations was reviewed   Review of Systems  All other systems reviewed and are negative.      Objective:   Physical Exam Alert and in no distress. Tympanic membranes and canals are normal. Pharyngeal area is normal. Neck is supple without adenopathy or thyromegaly. Cardiac exam shows a regular sinus rhythm without murmurs or gallops. Lungs are clear to auscultation. Abdominal exam shows no masses or tenderness with normal bowel sounds       Assessment & Plan:  Routine general medical examination at a health care facility - Plan: POCT Urinalysis DIP (Proadvantage Device), CBC with Differential, Comprehensive metabolic panel, Lipid panel  Gastroesophageal reflux disease without esophagitis  Osteoporosis, unspecified osteoporosis type, unspecified pathological fracture presence  Lactose intolerance  History of vitamin D deficiency - Plan: Vitamin D 25 hydroxy  Thyroid function test abnormal - Plan: TSH  Disorder of gallbladder I encouraged her to continue to take good care of herself.  Discussed avoiding greasy foods.

## 2019-03-02 LAB — COMPREHENSIVE METABOLIC PANEL
ALT: 11 IU/L (ref 0–32)
AST: 14 IU/L (ref 0–40)
Albumin/Globulin Ratio: 2.6 — ABNORMAL HIGH (ref 1.2–2.2)
Albumin: 4.9 g/dL — ABNORMAL HIGH (ref 3.8–4.8)
Alkaline Phosphatase: 53 IU/L (ref 39–117)
BUN/Creatinine Ratio: 25 (ref 12–28)
BUN: 12 mg/dL (ref 8–27)
Bilirubin Total: 0.5 mg/dL (ref 0.0–1.2)
CO2: 27 mmol/L (ref 20–29)
Calcium: 9.4 mg/dL (ref 8.7–10.3)
Chloride: 102 mmol/L (ref 96–106)
Creatinine, Ser: 0.48 mg/dL — ABNORMAL LOW (ref 0.57–1.00)
GFR calc Af Amer: 122 mL/min/{1.73_m2} (ref >59)
GFR calc non Af Amer: 105 mL/min/{1.73_m2} (ref >59)
Globulin, Total: 1.9 g/dL (ref 1.5–4.5)
Glucose: 92 mg/dL (ref 65–99)
Potassium: 4.7 mmol/L (ref 3.5–5.2)
Sodium: 142 mmol/L (ref 134–144)
Total Protein: 6.8 g/dL (ref 6.0–8.5)

## 2019-03-02 LAB — CBC WITH DIFFERENTIAL/PLATELET
Basophils Absolute: 0.2 10*3/uL (ref 0.0–0.2)
Basos: 2 %
EOS (ABSOLUTE): 0.3 10*3/uL (ref 0.0–0.4)
Eos: 4 %
Hematocrit: 46.2 % (ref 34.0–46.6)
Hemoglobin: 15.3 g/dL (ref 11.1–15.9)
Immature Grans (Abs): 0 10*3/uL (ref 0.0–0.1)
Immature Granulocytes: 0 %
Lymphocytes Absolute: 2.2 10*3/uL (ref 0.7–3.1)
Lymphs: 32 %
MCH: 30.4 pg (ref 26.6–33.0)
MCHC: 33.1 g/dL (ref 31.5–35.7)
MCV: 92 fL (ref 79–97)
Monocytes Absolute: 0.8 10*3/uL (ref 0.1–0.9)
Monocytes: 12 %
Neutrophils Absolute: 3.4 10*3/uL (ref 1.4–7.0)
Neutrophils: 50 %
Platelets: 331 10*3/uL (ref 150–450)
RBC: 5.04 x10E6/uL (ref 3.77–5.28)
RDW: 11.8 % (ref 11.7–15.4)
WBC: 6.8 10*3/uL (ref 3.4–10.8)

## 2019-03-02 LAB — LIPID PANEL
Chol/HDL Ratio: 3.1 ratio (ref 0.0–4.4)
Cholesterol, Total: 211 mg/dL — ABNORMAL HIGH (ref 100–199)
HDL: 69 mg/dL (ref >39)
LDL Chol Calc (NIH): 131 mg/dL — ABNORMAL HIGH (ref 0–99)
Triglycerides: 62 mg/dL (ref 0–149)
VLDL Cholesterol Cal: 11 mg/dL (ref 5–40)

## 2019-03-02 LAB — VITAMIN D 25 HYDROXY (VIT D DEFICIENCY, FRACTURES): Vit D, 25-Hydroxy: 38.8 ng/mL (ref 30.0–100.0)

## 2019-03-02 LAB — TSH: TSH: 0.748 u[IU]/mL (ref 0.450–4.500)

## 2019-05-08 ENCOUNTER — Other Ambulatory Visit: Payer: Self-pay | Admitting: Family Medicine

## 2019-05-09 NOTE — Telephone Encounter (Signed)
[facility] is requesting to fill pt alendronate. Please advise [facility]

## 2019-05-15 ENCOUNTER — Ambulatory Visit: Payer: 59 | Attending: Internal Medicine

## 2019-05-15 DIAGNOSIS — Z23 Encounter for immunization: Secondary | ICD-10-CM

## 2019-05-15 NOTE — Progress Notes (Signed)
   Covid-19 Vaccination Clinic  Name:  KHOLE MEGA    MRN: [id number] DOB: [birth year]  05/15/2019  Ms. Tylicki was observed post Covid-19 immunization for 15 minutes without incident. She was provided with Vaccine Information Sheet and instruction to access the V-Safe system.   Ms. Nila was instructed to call 911 with any severe reactions post vaccine: Marland Kitchen Difficulty breathing  . Swelling of face and throat  . A fast heartbeat  . A bad rash all over body  . Dizziness and weakness   Immunizations Administered    Name Date Dose VIS Date Route   Pfizer COVID-19 Vaccine 05/15/2019  2:13 PM 0.3 mL 02/18/2019 Intramuscular   Manufacturer: [place]   Lot: [id number]   [place]: [id number]

## 2019-06-14 ENCOUNTER — Ambulatory Visit: Payer: 59 | Attending: Internal Medicine

## 2019-06-14 DIAGNOSIS — Z23 Encounter for immunization: Secondary | ICD-10-CM

## 2019-06-14 NOTE — Progress Notes (Signed)
   Covid-19 Vaccination Clinic  Name:  CHRISTINAMARIE KLIMA    MRN: [id number] DOB: 09/19/1955  06/14/2019  Ms. Rumer was observed post Covid-19 immunization for 15 minutes without incident. She was provided with Vaccine Information Sheet and instruction to access the V-Safe system.   Ms. Stephen was instructed to call 911 with any severe reactions post vaccine: Marland Kitchen Difficulty breathing  . Swelling of face and throat  . A fast heartbeat  . A bad rash all over body  . Dizziness and weakness   Immunizations Administered    Name Date Dose VIS Date Route   Pfizer COVID-19 Vaccine 06/14/2019 12:43 PM 0.3 mL 02/18/2019 Intramuscular   Manufacturer: [place]   Lot: [id number]   [place]: [id number]

## 2019-07-07 ENCOUNTER — Telehealth: Payer: 59 | Admitting: Cardiology

## 2019-07-12 NOTE — Progress Notes (Signed)
Virtual Visit via Telephone Note   This visit type was conducted due to national recommendations for restrictions regarding the COVID-19 Pandemic (e.g. social distancing) in an effort to limit this patient's exposure and mitigate transmission in our community.  Due to her co-morbid illnesses, this patient is at least at moderate risk for complications without adequate follow up.  This format is felt to be most appropriate for this patient at this time.  The patient did not have access to video technology/had technical difficulties with video requiring transitioning to audio format only (telephone).  All issues noted in this document were discussed and addressed.  No physical exam could be performed with this format.  Please refer to the patient's chart for her  consent to telehealth for [facility].   The patient was identified using 2 identifiers.  Date:  07/13/2019   ID:  [name], DOB [birth year], MRN [id number]  Patient Location: Home Provider Location: Office  PCP:  Denita Lung, MD  Cardiologist:  Kirk Ruths, MD  Electrophysiologist:  None   Evaluation Performed:  Follow-Up Visit  Chief Complaint:  Atypical chest pain  History of Present Illness:    [name] is a 64 y.o. female with a history of mitral valve prolapse and atypical chest pain. She was last seen in clinic 12/2017 following workup for atypical CP including left upper extremity pain with exertion. CT coronary was not obtained due to difficulty reducing her heart rate. Nuclear stress testing was negative for ischemia. Echo in 2019 also did not show mitral valve prolapse. Decision was made to monitor symptoms. If worse, would consider angiography. She has not been seen since.   She presents for routine follow up. She does report chest tightness sometimes with walking. CP does not occur every time she goes for a walk, and occurs after 10 minutes of walking.  About 3-4 weeks ago, she realized she has gained  6-7 lbs during pandemic. She embarked on diet to include cutting out sweets and bread, in addition to increasing her physical activity. She has lost 6 lbs and has not had recurrence of chest pain with weight loss. She also battles GERD and questions if this is contributing to her chest pain. We had a long discussion about her chest tightness and etiologies including GERD, angina, microvascular disease, and esophageal spasm.   The patient does not have symptoms concerning for COVID-19 infection (fever, chills, cough, or new shortness of breath).    Past Medical History:  Diagnosis Date  . Anxiety    very high  . GERD (gastroesophageal reflux disease)   . Glaucoma   . Heart murmur    Pt saw Cardiologist when she was in her 67s, not since.  . IBS (irritable bowel syndrome)   . MVP (mitral valve prolapse)   . Neuropathy 2016   in right leg  . Osteoporosis   . Scoliosis    Past Surgical History:  Procedure Laterality Date  . CESAREAN SECTION  1988  . SHOULDER ARTHROSCOPY WITH SUBACROMIAL DECOMPRESSION AND OPEN ROTATOR C Right 12/09/2013   Procedure: RIGHT SHOULDER ARTHROSCOPY WITH SUBACROMIAL DECOMPRESSION AND MINI OPEN ROTATOR CUFF REPAIR, AND BICEPS TENDODESIS;  Surgeon: Augustin Schooling, MD;  Location: [place];  Service: Orthopedics;  Laterality: Right;  . TONSILLECTOMY       Current Meds  Medication Sig  . Acetylcarnitine HCl (ACETYL L-CARNITINE PO) Take by mouth daily.  Marland Kitchen alendronate (FOSAMAX) 70 MG tablet TAKE 1 TABLET (70 MG TOTAL)  BY MOUTH ONCE A WEEK. MONDAYS  . aspirin 81 MG chewable tablet Chew 81 mg by mouth daily.   Marland Kitchen b complex vitamins capsule Take 1 capsule by mouth once a week. Every other week  . Cholecalciferol (VITAMIN D) 2000 UNITS CAPS Take 2,000 Units by mouth daily.  . cycloSPORINE (RESTASIS) 0.05 % ophthalmic emulsion Place 1 drop into both eyes 2 (two) times daily.  . famotidine (PEPCID) 20 MG tablet Take 10 mg by mouth daily.   Marland Kitchen L-LYSINE PO Take by mouth.  .  latanoprost (XALATAN) 0.005 % ophthalmic solution Place 1 drop into both eyes at bedtime.  . Magnesium 250 MG TABS Take 250 mg by mouth every other day. WEEKLY  . Omega-3 Fatty Acids (FISH OIL PO) Take 1 capsule by mouth daily.  . vitamin C (ASCORBIC ACID) 500 MG tablet Take 500 mg by mouth daily.  Merril Abbe 10 MCG TABS vaginal tablet , on tuesdays and fridays     Allergies:   Patient has no known allergies.   Social History   Tobacco Use  . Smoking status: Never Smoker  . Smokeless tobacco: Never Used  Substance Use Topics  . Alcohol use: Yes    Comment: rarely  . Drug use: No     Family Hx: The patient's family history includes Arthritis in her mother; Atrial fibrillation in her mother; CAD in her father; Depression in her father; Hyperlipidemia in her mother; Parkinson's disease in her father; Skin cancer in her father. There is no history of Colon cancer, Colon polyps, Esophageal cancer, Stomach cancer, or Rectal cancer.  ROS:   Please see the history of present illness.     All other systems reviewed and are negative.   Prior CV studies:   The following studies were reviewed today:  Nuclear stress test 11/24/17:  The left ventricular ejection fraction is hyperdynamic (>65%).  Nuclear stress EF: 70%.  Blood pressure demonstrated a normal response to exercise.  There was no ST segment deviation noted during stress.  The study is normal.  This is a low risk study.   Normal stress nuclear study with no ischemia or infarction.  Gated ejection fraction 70%.   Echo 09/22/17: Study Conclusions   - Left ventricle: The cavity size was normal. Systolic function was  normal. The estimated ejection fraction was in the range of 60%  to 65%. Wall motion was normal; there were no regional wall  motion abnormalities. Left ventricular diastolic function  parameters were normal.  - Aortic valve: There was trivial regurgitation.  - Left atrium: The atrium was mildly  dilated.  - Pulmonary arteries: PA peak pressure: 32 mm Hg (S).   Labs/Other Tests and Data Reviewed:    EKG:  No ECG reviewed.  Recent Labs: 03/01/2019: ALT 11; BUN 12; Creatinine, Ser 0.48; Hemoglobin 15.3; Platelets 331; Potassium 4.7; Sodium 142; TSH 0.748   Recent Lipid Panel Lab Results  Component Value Date/Time   CHOL 211 (H) 03/01/2019 09:58 AM   TRIG 62 03/01/2019 09:58 AM   HDL 69 03/01/2019 09:58 AM   CHOLHDL 3.1 03/01/2019 09:58 AM   LDLCALC 131 (H) 03/01/2019 09:58 AM    Wt Readings from Last 3 Encounters:  07/13/19 140 lb (63.5 kg)  03/01/19 147 lb 3.2 oz (66.8 kg)  12/02/18 145 lb (65.8 kg)     Objective:    Vital Signs:  Ht 5\' 3"  (1.6 m)   Wt 140 lb (63.5 kg)   LMP  (LMP Unknown)  BMI 24.80 kg/m    VITAL SIGNS:  reviewed GEN:  no acute distress RESPIRATORY:  respirations unlabored, normal breathing while walking NEURO:  alert and oriented x 3, no obvious focal deficit PSYCH:  normal affect  ASSESSMENT & PLAN:    Chest pain - no further symptoms since weight loss - will hold off for now on further ischemic workup, but did discuss treadmill stress echo vs angiography - continue 81 mg ASA   GERD - I will prescribe low dose protonix to see if this helps when she has chest pain/GERD   Intentional weight loss - has lost the 6-7 lbs gained during pandemic - congratulated her on this success   Risk factor modification Hyperlipidemia 03/01/2019: Cholesterol, Total 211; HDL 69; LDL Chol Calc (NIH) 131; Triglycerides 62 - re-check after weight loss and increased physical activity at her follow up appt - suspect her LDL is better with these lifestyle changes - if not improved, consider low dose statin    COVID-19 Education: The signs and symptoms of COVID-19 were discussed with the patient and how to seek care for testing (follow up with PCP or arrange E-visit).  The importance of social distancing was discussed today.  Time:   Today, I have  spent 27 minutes with the patient with telehealth technology discussing the above problems.     Medication Adjustments/Labs and Tests Ordered: Current medicines are reviewed at length with the patient today.  Concerns regarding medicines are outlined above.   Tests Ordered: No orders of the defined types were placed in this encounter.   Medication Changes: Meds ordered this encounter  Medications  . pantoprazole (PROTONIX) 20 MG tablet    Sig: Take 1 tablet (20 mg total) by mouth daily.    Dispense:  90 tablet    Refill:  3    Follow Up:  In Person in 4 month(s)  Signed, Ledora Bottcher, [place]  07/13/2019 9:44 AM    [place]

## 2019-07-13 ENCOUNTER — Encounter: Payer: Self-pay | Admitting: Physician Assistant

## 2019-07-13 ENCOUNTER — Telehealth (INDEPENDENT_AMBULATORY_CARE_PROVIDER_SITE_OTHER): Payer: 59 | Admitting: Physician Assistant

## 2019-07-13 VITALS — Ht 63.0 in | Wt 140.0 lb

## 2019-07-13 DIAGNOSIS — R079 Chest pain, unspecified: Secondary | ICD-10-CM

## 2019-07-13 DIAGNOSIS — R634 Abnormal weight loss: Secondary | ICD-10-CM

## 2019-07-13 DIAGNOSIS — E7800 Pure hypercholesterolemia, unspecified: Secondary | ICD-10-CM

## 2019-07-13 DIAGNOSIS — K219 Gastro-esophageal reflux disease without esophagitis: Secondary | ICD-10-CM | POA: Diagnosis not present

## 2019-07-13 MED ORDER — PANTOPRAZOLE SODIUM 20 MG PO TBEC: 20.0000 mg | DELAYED_RELEASE_TABLET | Freq: Every day | ORAL | 3 refills | Status: DC

## 2019-07-13 NOTE — Patient Instructions (Signed)
Medication Instructions:   START Protonix 20 mg daily.  *If you need a refill on your cardiac medications before your next appointment, please call your pharmacy*   Follow-Up: At [facility], you and your health needs are our priority.  As part of our continuing mission to provide you with exceptional heart care, we have created designated Provider Care Teams.  These Care Teams include your primary Cardiologist (physician) and Advanced Practice Providers (APPs -  Physician Assistants and Nurse Practitioners) who all work together to provide you with the care you need, when you need it.  We recommend signing up for the patient portal called "MyChart".  Sign up information is provided on this After Visit Summary.  MyChart is used to connect with patients for Virtual Visits (Telemedicine).  Patients are able to view lab/test results, encounter notes, upcoming appointments, etc.  Non-urgent messages can be sent to your provider as well.   To learn more about what you can do with MyChart, go to [email or web address].    Your next appointment:   4 month(s)  The format for your next appointment:   In Person  Provider:   Kirk Ruths, MD   Other Instructions Our office will contact you to schedule your follow-up appointment.

## 2019-07-13 NOTE — Addendum Note (Signed)
Addended by: Therisa Doyne on: 07/13/2019 11:55 AM   Modules accepted: Orders

## 2019-07-20 LAB — HM MAMMOGRAPHY

## 2019-08-02 ENCOUNTER — Other Ambulatory Visit: Payer: Self-pay | Admitting: Family Medicine

## 2019-08-09 ENCOUNTER — Encounter: Payer: Self-pay | Admitting: Family Medicine

## 2019-08-09 ENCOUNTER — Telehealth: Payer: Self-pay

## 2019-08-09 MED ORDER — ALPRAZOLAM 0.25 MG PO TABS: 0.2500 mg | ORAL_TABLET | Freq: Two times a day (BID) | ORAL | 0 refills | Status: DC | PRN

## 2019-08-09 NOTE — Telephone Encounter (Signed)
Opened in error

## 2019-10-20 ENCOUNTER — Other Ambulatory Visit: Payer: Self-pay | Admitting: Family Medicine

## 2019-11-08 ENCOUNTER — Telehealth: Payer: Self-pay | Admitting: *Deleted

## 2019-11-08 NOTE — Telephone Encounter (Signed)
A message was left, re: her follow up visit. 

## 2019-12-05 ENCOUNTER — Ambulatory Visit: Payer: 59 | Admitting: Obstetrics & Gynecology

## 2019-12-05 ENCOUNTER — Encounter: Payer: Self-pay | Admitting: Family Medicine

## 2019-12-05 ENCOUNTER — Other Ambulatory Visit: Payer: Self-pay

## 2019-12-05 ENCOUNTER — Ambulatory Visit: Payer: 59 | Admitting: Family Medicine

## 2019-12-05 VITALS — BP 104/64 | HR 83 | Temp 99.1°F | Wt 137.6 lb

## 2019-12-05 DIAGNOSIS — M79602 Pain in left arm: Secondary | ICD-10-CM | POA: Diagnosis not present

## 2019-12-05 NOTE — Progress Notes (Signed)
   Subjective:    Patient ID: [name], female    DOB: Jan 02, 1956, 64 y.o.   MRN: [id number]  HPI She got the flu shot in her left arm last Friday and roughly an hour later experienced some left arm and shoulder discomfort.  She states that earlier today she had a hard time lifting her arm and did take 2 ibuprofen.  She states now that the arm is much less painful.   Review of Systems     Objective:   Physical Exam Alert and in no distress.  Full motion of the arm without pain.  No palpable tenderness is noted.       Assessment & Plan:  Left arm pain Since she is doing better right now, recommend using 2 Tylenol 4 times per day.  Explained that this could have been an adverse reaction from the flu shot.  She was comfortable with this

## 2019-12-09 ENCOUNTER — Other Ambulatory Visit: Payer: Self-pay | Admitting: Obstetrics & Gynecology

## 2019-12-09 NOTE — Telephone Encounter (Signed)
Annual exam scheduled on 12/15/19

## 2019-12-15 ENCOUNTER — Other Ambulatory Visit: Payer: Self-pay

## 2019-12-15 ENCOUNTER — Ambulatory Visit (INDEPENDENT_AMBULATORY_CARE_PROVIDER_SITE_OTHER): Payer: 59 | Admitting: Obstetrics & Gynecology

## 2019-12-15 ENCOUNTER — Encounter: Payer: Self-pay | Admitting: Obstetrics & Gynecology

## 2019-12-15 VITALS — BP 100/70 | Ht 63.0 in | Wt 137.0 lb

## 2019-12-15 DIAGNOSIS — Z01419 Encounter for gynecological examination (general) (routine) without abnormal findings: Secondary | ICD-10-CM

## 2019-12-15 DIAGNOSIS — N952 Postmenopausal atrophic vaginitis: Secondary | ICD-10-CM

## 2019-12-15 DIAGNOSIS — M810 Age-related osteoporosis without current pathological fracture: Secondary | ICD-10-CM

## 2019-12-15 DIAGNOSIS — Z780 Asymptomatic menopausal state: Secondary | ICD-10-CM | POA: Diagnosis not present

## 2019-12-15 MED ORDER — YUVAFEM 10 MCG VA TABS: ORAL_TABLET | VAGINAL | 4 refills | Status: DC

## 2019-12-15 NOTE — Progress Notes (Signed)
[name] [birth year] [id number]   History:    64 y.o. G2P2L2 Married  RP:  Established patient presenting for annual gyn exam   HPI: Postmenopause, well on no HRT.  No PMB.  No pelvic pain.  Occasionally sexually active.  On Vagifem twice a week.  Urine/BMs normal.  Breasts normal.  BMI 24.27.  Walking 30 min every day. Followed by [facility] on Fosamax x 3 yrs.  Last BD 08/2018 improved with T-Score of -2.4.  Health labs with Dr [facility].  Colono 07/2017.  Past medical history,surgical history, family history and social history were all reviewed and documented in the EPIC chart.  Gynecologic History No LMP recorded (lmp unknown). Patient is postmenopausal.  Obstetric History OB History  Gravida Para Term Preterm AB Living  2 2       2   SAB TAB Ectopic Multiple Live Births               # Outcome Date GA Lbr Len/2nd Weight Sex Delivery Anes PTL Lv  2 Para           1 Para              ROS: A ROS was performed and pertinent positives and negatives are included in the history.  GENERAL: No fevers or chills. HEENT: No change in vision, no earache, sore throat or sinus congestion. NECK: No pain or stiffness. CARDIOVASCULAR: No chest pain or pressure. No palpitations. PULMONARY: No shortness of breath, cough or wheeze. GASTROINTESTINAL: No abdominal pain, nausea, vomiting or diarrhea, melena or bright red blood per rectum. GENITOURINARY: No urinary frequency, urgency, hesitancy or dysuria. MUSCULOSKELETAL: No joint or muscle pain, no back pain, no recent trauma. DERMATOLOGIC: No rash, no itching, no lesions. ENDOCRINE: No polyuria, polydipsia, no heat or cold intolerance. No recent change in weight. HEMATOLOGICAL: No anemia or easy bruising or bleeding. NEUROLOGIC: No headache, seizures, numbness, tingling or weakness. PSYCHIATRIC: No depression, no loss of interest in normal activity or change in sleep pattern.     Exam:   BP 100/70 (BP Location: Right Arm,  Patient Position: Sitting, Cuff Size: Normal)   Ht 5\' 3"  (1.6 m)   Wt 137 lb (62.1 kg)   LMP  (LMP Unknown)   BMI 24.27 kg/m   Body mass index is 24.27 kg/m.  General appearance : Well developed well nourished female. No acute distress HEENT: Eyes: no retinal hemorrhage or exudates,  Neck supple, trachea midline, no carotid bruits, no thyroidmegaly Lungs: Clear to auscultation, no rhonchi or wheezes, or rib retractions  Heart: Regular rate and rhythm, no murmurs or gallops Breast:Examined in sitting and supine position were symmetrical in appearance, no palpable masses or tenderness,  no skin retraction, no nipple inversion, no nipple discharge, no skin discoloration, no axillary or supraclavicular lymphadenopathy Abdomen: no palpable masses or tenderness, no rebound or guarding Extremities: no edema or skin discoloration or tenderness  Pelvic: Vulva: Normal             Vagina: No gross lesions or discharge  Cervix: No gross lesions or discharge.    Uterus AV, normal size, shape and consistency, non-tender and mobile  Adnexa  Without masses or tenderness  Anus: Normal   Assessment/Plan:  64 y.o. female for annual exam   1. Well female exam with routine gynecological exam Normal gynecologic exam in menopause.  No indication for Pap test this year.  Breast exam normal.  Last mammogram May 2021 was negative.  Colonoscopy May 2019.  Health labs with family physician.  Good body mass index at 24.27.  Continue with fitness and healthy nutrition.  2. Postmenopause Well on no systemic hormone replacement therapy.  No postmenopausal bleeding.  3. Postmenopausal atrophic vaginitis Well on estradiol tablets vaginally twice a week.  No contraindication to continue.  Prescription sent to pharmacy.  4. Osteoporosis without current pathological fracture, unspecified osteoporosis type History of osteoporosis with last bone density June 2020 improved to osteopenia on Fosamax x3 years now.   Continue with vitamin D supplements.  Calcium intake of 1500 mg daily.  Regular weightbearing physical activities recommended.  Other orders - YUVAFEM 10 MCG TABS vaginal tablet; PLACE 1 INSERT VAGINALLY TWICE WEEKLY ON TUESDAYS AND FRIDAYS  Princess Bruins MD, 3:07 PM 12/15/2019

## 2020-01-25 ENCOUNTER — Other Ambulatory Visit: Payer: Self-pay | Admitting: Family Medicine

## 2020-03-05 ENCOUNTER — Encounter: Payer: Self-pay | Admitting: Family Medicine

## 2020-03-05 ENCOUNTER — Ambulatory Visit: Payer: 59 | Admitting: Family Medicine

## 2020-03-05 VITALS — BP 112/70 | HR 77 | Ht 63.0 in | Wt 136.8 lb

## 2020-03-05 DIAGNOSIS — Z1159 Encounter for screening for other viral diseases: Secondary | ICD-10-CM

## 2020-03-05 DIAGNOSIS — K219 Gastro-esophageal reflux disease without esophagitis: Secondary | ICD-10-CM | POA: Diagnosis not present

## 2020-03-05 DIAGNOSIS — M810 Age-related osteoporosis without current pathological fracture: Secondary | ICD-10-CM | POA: Diagnosis not present

## 2020-03-05 DIAGNOSIS — K829 Disease of gallbladder, unspecified: Secondary | ICD-10-CM

## 2020-03-05 DIAGNOSIS — Z8639 Personal history of other endocrine, nutritional and metabolic disease: Secondary | ICD-10-CM | POA: Diagnosis not present

## 2020-03-05 DIAGNOSIS — E739 Lactose intolerance, unspecified: Secondary | ICD-10-CM | POA: Diagnosis not present

## 2020-03-05 DIAGNOSIS — Z0000 Encounter for general adult medical examination without abnormal findings: Secondary | ICD-10-CM

## 2020-03-05 NOTE — Patient Instructions (Addendum)
Use the 20 mg Pepcid for 10 days to 2 weeks to quiet everything down then start to back off to every other day based on your symptoms.  We should for minimum effective dose.  GI referral is always an option at some point.  Avoid things that you know cause trouble  Try Rhinocort nasal spray regularly for several weeks and see what that will do

## 2020-03-05 NOTE — Progress Notes (Signed)
   Subjective:    Patient ID: [name], female    DOB: 29-May-1955, 64 y.o.   MRN: [id number]  HPI She is here for complete examination.  She does have underlying reflux disease and presently is taking Protonix but taking it intermittently.  She is concerned about switching to a PPI and is therefore not interested.  She does have a history of lactose intolerance and does a good job of staying away from it.  She also has a previous history of biliary dyskinesia but is really not had any difficulty with greasy foods.  She continues on Fosamax for her osteoporosis.  Recent DEXA scan was -2.4.  She continues on vitamin D supplementation for previous history of deficiency.  She has noted in the last month or so some slight difficulty with nasal congestion but no previous.  Otherwise she has no particular concerns or complaints.  Family and social history as well as health maintenance and immunizations was reviewed.  She is not semiretired and enjoying it.   Review of Systems  All other systems reviewed and are negative.      Objective:   Physical Exam Alert and in no distress. Tympanic membranes and canals are normal. Pharyngeal area is normal. Neck is supple without adenopathy or thyromegaly. Cardiac exam shows a regular sinus rhythm without murmurs or gallops. Lungs are clear to auscultation. Abdominal exam shows no masses or tenderness.        Assessment & Plan:  Routine general medical examination at a health care facility - Plan: CBC with Differential/Platelet, Comprehensive metabolic panel, Lipid panel  Gastroesophageal reflux disease without esophagitis  Lactose intolerance  History of vitamin D deficiency  Osteoporosis without current pathological fracture, unspecified osteoporosis type - Plan: DG Bone Density  Disorder of gallbladder  Need for hepatitis C screening test - Plan: Hepatitis C antibody Use the 20 mg Pepcid for 10 days to 2 weeks to quiet everything down then  start to back off to every other day based on your symptoms.  We should for minimum effective dose.  GI referral is always an option at some point.  Avoid things that you know cause trouble  Try Rhinocort nasal spray regularly for several weeks and see what that will do

## 2020-03-06 LAB — CBC WITH DIFFERENTIAL/PLATELET
Basophils Absolute: 0.2 10*3/uL (ref 0.0–0.2)
Basos: 2 %
EOS (ABSOLUTE): 0.2 10*3/uL (ref 0.0–0.4)
Eos: 3 %
Hematocrit: 46.3 % (ref 34.0–46.6)
Hemoglobin: 15.1 g/dL (ref 11.1–15.9)
Immature Grans (Abs): 0 10*3/uL (ref 0.0–0.1)
Immature Granulocytes: 0 %
Lymphocytes Absolute: 2.1 10*3/uL (ref 0.7–3.1)
Lymphs: 30 %
MCH: 29.5 pg (ref 26.6–33.0)
MCHC: 32.6 g/dL (ref 31.5–35.7)
MCV: 90 fL (ref 79–97)
Monocytes Absolute: 0.8 10*3/uL (ref 0.1–0.9)
Monocytes: 12 %
Neutrophils Absolute: 3.8 10*3/uL (ref 1.4–7.0)
Neutrophils: 53 %
Platelets: 304 10*3/uL (ref 150–450)
RBC: 5.12 x10E6/uL (ref 3.77–5.28)
RDW: 11.6 % — ABNORMAL LOW (ref 11.7–15.4)
WBC: 7.2 10*3/uL (ref 3.4–10.8)

## 2020-03-06 LAB — COMPREHENSIVE METABOLIC PANEL
ALT: 13 IU/L (ref 0–32)
AST: 18 IU/L (ref 0–40)
Albumin/Globulin Ratio: 2.4 — ABNORMAL HIGH (ref 1.2–2.2)
Albumin: 4.8 g/dL (ref 3.8–4.8)
Alkaline Phosphatase: 49 IU/L (ref 44–121)
BUN/Creatinine Ratio: 19 (ref 12–28)
BUN: 12 mg/dL (ref 8–27)
Bilirubin Total: 0.5 mg/dL (ref 0.0–1.2)
CO2: 22 mmol/L (ref 20–29)
Calcium: 9.4 mg/dL (ref 8.7–10.3)
Chloride: 100 mmol/L (ref 96–106)
Creatinine, Ser: 0.64 mg/dL (ref 0.57–1.00)
GFR calc Af Amer: 110 mL/min/{1.73_m2} (ref >59)
GFR calc non Af Amer: 95 mL/min/{1.73_m2} (ref >59)
Globulin, Total: 2 g/dL (ref 1.5–4.5)
Glucose: 91 mg/dL (ref 65–99)
Potassium: 4.7 mmol/L (ref 3.5–5.2)
Sodium: 143 mmol/L (ref 134–144)
Total Protein: 6.8 g/dL (ref 6.0–8.5)

## 2020-03-06 LAB — LIPID PANEL
Chol/HDL Ratio: 2.9 ratio (ref 0.0–4.4)
Cholesterol, Total: 226 mg/dL — ABNORMAL HIGH (ref 100–199)
HDL: 78 mg/dL (ref >39)
LDL Chol Calc (NIH): 139 mg/dL — ABNORMAL HIGH (ref 0–99)
Triglycerides: 52 mg/dL (ref 0–149)
VLDL Cholesterol Cal: 9 mg/dL (ref 5–40)

## 2020-03-06 LAB — HEPATITIS C ANTIBODY: Hep C Virus Ab: 0.1 s/co ratio (ref 0.0–0.9)

## 2020-04-04 ENCOUNTER — Other Ambulatory Visit: Payer: Self-pay | Admitting: Family Medicine

## 2020-04-04 DIAGNOSIS — M810 Age-related osteoporosis without current pathological fracture: Secondary | ICD-10-CM

## 2020-04-13 ENCOUNTER — Encounter: Payer: Self-pay | Admitting: Family Medicine

## 2020-04-13 ENCOUNTER — Other Ambulatory Visit: Payer: Self-pay

## 2020-04-13 MED ORDER — ALENDRONATE SODIUM 70 MG PO TABS: 70.0000 mg | ORAL_TABLET | ORAL | 2 refills | Status: DC

## 2020-06-30 ENCOUNTER — Other Ambulatory Visit: Payer: Self-pay | Admitting: Family Medicine

## 2020-07-24 LAB — HM MAMMOGRAPHY

## 2020-07-30 ENCOUNTER — Encounter: Payer: Self-pay | Admitting: Family Medicine

## 2020-08-28 ENCOUNTER — Ambulatory Visit
Admission: RE | Admit: 2020-08-28 | Discharge: 2020-08-28 | Disposition: A | Discharge status: Home / Self Care | Payer: 59 | Source: Ambulatory Visit | Attending: Family Medicine | Admitting: Family Medicine

## 2020-08-28 ENCOUNTER — Other Ambulatory Visit: Payer: Self-pay

## 2020-08-28 DIAGNOSIS — M810 Age-related osteoporosis without current pathological fracture: Secondary | ICD-10-CM

## 2020-09-05 ENCOUNTER — Other Ambulatory Visit: Payer: Self-pay

## 2020-09-05 ENCOUNTER — Encounter: Payer: Self-pay | Admitting: Family Medicine

## 2020-09-05 ENCOUNTER — Telehealth (INDEPENDENT_AMBULATORY_CARE_PROVIDER_SITE_OTHER): Payer: 59 | Admitting: Family Medicine

## 2020-09-05 VITALS — Ht 63.5 in | Wt 136.0 lb

## 2020-09-05 DIAGNOSIS — M810 Age-related osteoporosis without current pathological fracture: Secondary | ICD-10-CM | POA: Diagnosis not present

## 2020-09-05 NOTE — Progress Notes (Signed)
   Subjective:    Patient ID: [name], female    DOB: Feb 13, 1956, 65 y.o.   MRN: [id number]  HPI Documentation for virtual audio and video telecommunications through [place] encounter: The patient was located at home. 2 patient identifiers used.  The provider was located in the office. The patient did consent to this visit and is aware of possible charges through their insurance for this visit. The other persons participating in this telemedicine service were none. Time spent on call was 5 minutes and in review of previous records >20 minutes total for counseling and coordination of care. This virtual service is not related to other E/M service within previous 7 days.  Today's visit is to discuss osteoporosis.  She has been on Fosamax for approximately 5 years.  Her most recent T score was -2.5 which is the same as it was in 2018.  She is really made no progress.  Review of Systems     Objective:   Physical Exam Alert and in no distress otherwise not examined       Assessment & Plan:  Osteoporosis without current pathological fracture, unspecified osteoporosis type Since she is really made no progress with the Fosamax I think is reasonable to try to get her on Prolia and will start the appropriate paperwork for that.  She was comfortable with this.

## 2020-09-11 ENCOUNTER — Encounter: Payer: Self-pay | Admitting: *Deleted

## 2020-09-19 ENCOUNTER — Telehealth: Payer: Self-pay | Admitting: Family Medicine

## 2020-09-19 NOTE — Telephone Encounter (Signed)
Received information from [employer] company concerning Prolia. Medication was denied by her insurance. 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 Appeal is not an option while pt is under this commercial plan.

## 2020-09-27 NOTE — Telephone Encounter (Signed)
Called pt and advised her about approval for Prolia. She states she declines to take at this point. She is concerned about side effect of medication and is trying to make up her mind. Pt states she will call back if she decides to move forward with medication.  Pt was advised that I will keep all documentation if she wants to take medication.

## 2020-09-29 ENCOUNTER — Other Ambulatory Visit: Payer: Self-pay | Admitting: Family Medicine

## 2020-10-16 ENCOUNTER — Other Ambulatory Visit: Payer: Self-pay

## 2020-10-16 ENCOUNTER — Ambulatory Visit: Payer: 59 | Admitting: Family Medicine

## 2020-10-16 VITALS — BP 102/70 | HR 79 | Temp 98.7°F | Wt 142.2 lb

## 2020-10-16 DIAGNOSIS — Z8639 Personal history of other endocrine, nutritional and metabolic disease: Secondary | ICD-10-CM

## 2020-10-16 DIAGNOSIS — M810 Age-related osteoporosis without current pathological fracture: Secondary | ICD-10-CM

## 2020-10-16 NOTE — Progress Notes (Signed)
   Subjective:    Patient ID: [name], female    DOB: 05/24/1955, 65 y.o.   MRN: [id number]  HPI She is here for consult concerning osteoporosis.  She has been on a bisphosphonate for approximately 5 years.  The most recent DEXA scan was -2.5.  An attempt was made to get her set up for Prolia.  She is reluctant to do this because of potential issues of hip fracture.  She also has a previous history of vitamin D deficiency.   Review of Systems     Objective:   Physical Exam Alert and in no distress otherwise not examined       Assessment & Plan:  Osteoporosis without current pathological fracture, unspecified osteoporosis type  History of vitamin D deficiency - Plan: VITAMIN D 25 Hydroxy (Vit-D Deficiency, Fractures) I explained that at this point she is at increased risk of an osteoporotic fracture and that risk is greater than the risk of having a medication related fracture.  Discussed the fact that all medicines have potential side effects.  Did review some literature on this and again explained the fact that the risk versus benefit greatly points towards treating the potential hip fracture.  Discussed the fact that continuing on bisphosphonate actually increases the risk of medication related fracture.  She will consider all this and get back in touch with me.  We will check her vitamin D level to make sure that that is not an issue as her calcium is also in the normal range.

## 2020-10-17 LAB — VITAMIN D 25 HYDROXY (VIT D DEFICIENCY, FRACTURES): Vit D, 25-Hydroxy: 46.7 ng/mL (ref 30.0–100.0)

## 2020-12-17 ENCOUNTER — Ambulatory Visit: Payer: Self-pay | Admitting: Obstetrics & Gynecology

## 2021-01-05 ENCOUNTER — Other Ambulatory Visit: Payer: Self-pay | Admitting: Obstetrics & Gynecology

## 2021-01-07 NOTE — Telephone Encounter (Signed)
RX request Merril Abbe Last annual 12/15/2019 Upcoming annual scheduled 01/25/2021 I will route to Provider for approval

## 2021-01-21 ENCOUNTER — Other Ambulatory Visit: Payer: 59

## 2021-01-21 ENCOUNTER — Other Ambulatory Visit: Payer: Self-pay

## 2021-01-21 ENCOUNTER — Encounter: Payer: Self-pay | Admitting: Family Medicine

## 2021-01-21 ENCOUNTER — Telehealth: Payer: 59 | Admitting: Family Medicine

## 2021-01-21 VITALS — Temp 98.6°F | Wt 136.0 lb

## 2021-01-21 DIAGNOSIS — R051 Acute cough: Secondary | ICD-10-CM

## 2021-01-21 DIAGNOSIS — R509 Fever, unspecified: Secondary | ICD-10-CM

## 2021-01-21 DIAGNOSIS — U071 COVID-19: Secondary | ICD-10-CM | POA: Diagnosis not present

## 2021-01-21 LAB — POCT INFLUENZA A/B
Influenza A, POC: NEGATIVE
Influenza B, POC: NEGATIVE

## 2021-01-21 LAB — POC COVID19 BINAXNOW: SARS Coronavirus 2 Ag: POSITIVE — AB

## 2021-01-21 NOTE — Progress Notes (Signed)
   Subjective:    Patient ID: [name], female    DOB: 09-22-1955, 65 y.o.   MRN: [id number]  HPI Documentation for virtual audio and video telecommunications through [place] encounter:  The patient was located at home. 2 patient identifiers used.  The provider was located in the office. The patient did consent to this visit and is aware of possible charges through their insurance for this visit.  The other persons participating in this telemedicine service were none. Time spent on call was 3 minutes and in review of previous records >15 minutes total for counseling and coordination of care.  This virtual service is not related to other E/M service within previous 7 days.  She states that on Saturday she developed a slight sore throat, cough, fatigue with myalgias and fever.  She tested on that day 1 for COVID and was negative.  No smell or taste changes.  She has had 4 vaccines.  Her husband has similar symptoms.  He did COVID testing and his PCR came back positive.  Review of Systems     Objective:   Physical Exam Alert and in no distress otherwise not examined       Assessment & Plan:   Fever and chills - Plan: POC COVID-19, Influenza A/B  Acute cough - Plan: POC COVID-19, Influenza A/B  COVID-19 I explained that she needs to stay sequestered through Thursday and as long she is feeling better, she can then go out but still needs to wear a mask.  Recommend supportive care for her symptoms however if there is any questions, she is to call.  She expressed understanding of this.

## 2021-01-25 ENCOUNTER — Ambulatory Visit: Payer: 59 | Admitting: Obstetrics & Gynecology

## 2021-02-16 ENCOUNTER — Other Ambulatory Visit: Payer: Self-pay | Admitting: Family Medicine

## 2021-02-16 DIAGNOSIS — B001 Herpesviral vesicular dermatitis: Secondary | ICD-10-CM | POA: Insufficient documentation

## 2021-02-16 MED ORDER — VALACYCLOVIR HCL 1 G PO TABS: ORAL_TABLET | ORAL | 1 refills | Status: DC

## 2021-02-16 NOTE — Progress Notes (Signed)
She has an outbreak of herpes labialis so will call in Valtrex

## 2021-03-19 ENCOUNTER — Encounter: Payer: Self-pay | Admitting: Family Medicine

## 2021-03-19 ENCOUNTER — Other Ambulatory Visit: Payer: Self-pay

## 2021-03-19 ENCOUNTER — Ambulatory Visit (INDEPENDENT_AMBULATORY_CARE_PROVIDER_SITE_OTHER): Payer: 59 | Admitting: Family Medicine

## 2021-03-19 VITALS — BP 110/68 | HR 72 | Temp 97.4°F | Ht 63.25 in | Wt 139.2 lb

## 2021-03-19 DIAGNOSIS — Z0000 Encounter for general adult medical examination without abnormal findings: Secondary | ICD-10-CM

## 2021-03-19 DIAGNOSIS — Z1211 Encounter for screening for malignant neoplasm of colon: Secondary | ICD-10-CM

## 2021-03-19 DIAGNOSIS — K829 Disease of gallbladder, unspecified: Secondary | ICD-10-CM | POA: Diagnosis not present

## 2021-03-19 DIAGNOSIS — Z8639 Personal history of other endocrine, nutritional and metabolic disease: Secondary | ICD-10-CM

## 2021-03-19 DIAGNOSIS — E739 Lactose intolerance, unspecified: Secondary | ICD-10-CM

## 2021-03-19 DIAGNOSIS — M810 Age-related osteoporosis without current pathological fracture: Secondary | ICD-10-CM

## 2021-03-19 DIAGNOSIS — K219 Gastro-esophageal reflux disease without esophagitis: Secondary | ICD-10-CM

## 2021-03-19 MED ORDER — IBANDRONATE SODIUM 150 MG PO TABS: 150.0000 mg | ORAL_TABLET | ORAL | 3 refills | Status: DC

## 2021-03-19 NOTE — Progress Notes (Signed)
° °  Subjective:    Patient ID: [name], female    DOB: Jul 24, 1955, 66 y.o.   MRN: [id number]  HPI She is here for complete examination.  She does have a history of osteoporosis but has not started taking any medication as she had concerns over possible side effects.  She now feels a little more comfortable with starting on the medication.  She does have reflux symptoms and has used Pepcid in the past.  Presently she does intermittently have difficulty with that.  She also has had some gallbladder issues in the past but presently has not had any issues with greasy foods.  She does have lactose intolerance and knows what to stay away from.  She has a previous history of liver hemangioma and is apparently had follow-up on that on occasion.  She does have a history of vitamin D deficiency.  She did have 1 outbreak of herpes labialis but is not having difficulty since then.  Otherwise family and social history as well as health maintenance and immunizations was reviewed.  Her husband is retired and she will be retiring soon.  They do plan on enjoying their retirement with travel etc.   Review of Systems  All other systems reviewed and are negative.     Objective:   Physical Exam Alert and in no distress. Tympanic membranes and canals are normal. Pharyngeal area is normal. Neck is supple without adenopathy or thyromegaly. Cardiac exam shows a regular sinus rhythm without murmurs or gallops. Lungs are clear to auscultation.        Assessment & Plan:  Routine general medical examination at a health care facility - Plan: CBC with Differential/Platelet, Comprehensive metabolic panel, Lipid panel  History of vitamin D deficiency  Osteoporosis without current pathological fracture, unspecified osteoporosis type - Plan: ibandronate (BONIVA) 150 MG tablet  Screening for colon cancer - Plan: Cologuard  Disorder of gallbladder  Lactose intolerance  Gastroesophageal reflux disease without  esophagitis I will place her on Boniva discussed the need for taking it upright and drinking plenty of fluids.  Recommend Pepcid for her reflux symptoms on an as-needed basis.  She is also to take calcium and vitamin D supplementation.  She will call if she has further trouble with her abdominal symptoms and reflux type problems.  She knows to avoid lactose containing foods.

## 2021-03-20 LAB — COMPREHENSIVE METABOLIC PANEL
ALT: 12 IU/L (ref 0–32)
AST: 16 IU/L (ref 0–40)
Albumin/Globulin Ratio: 2.7 — ABNORMAL HIGH (ref 1.2–2.2)
Albumin: 4.8 g/dL (ref 3.8–4.8)
Alkaline Phosphatase: 65 IU/L (ref 44–121)
BUN/Creatinine Ratio: 15 (ref 12–28)
BUN: 10 mg/dL (ref 8–27)
Bilirubin Total: 0.5 mg/dL (ref 0.0–1.2)
CO2: 26 mmol/L (ref 20–29)
Calcium: 9.6 mg/dL (ref 8.7–10.3)
Chloride: 103 mmol/L (ref 96–106)
Creatinine, Ser: 0.65 mg/dL (ref 0.57–1.00)
Globulin, Total: 1.8 g/dL (ref 1.5–4.5)
Glucose: 88 mg/dL (ref 70–99)
Potassium: 4.8 mmol/L (ref 3.5–5.2)
Sodium: 143 mmol/L (ref 134–144)
Total Protein: 6.6 g/dL (ref 6.0–8.5)
eGFR: 98 mL/min/{1.73_m2} (ref >59)

## 2021-03-20 LAB — LIPID PANEL
Chol/HDL Ratio: 3.1 ratio (ref 0.0–4.4)
Cholesterol, Total: 222 mg/dL — ABNORMAL HIGH (ref 100–199)
HDL: 71 mg/dL (ref >39)
LDL Chol Calc (NIH): 141 mg/dL — ABNORMAL HIGH (ref 0–99)
Triglycerides: 60 mg/dL (ref 0–149)
VLDL Cholesterol Cal: 10 mg/dL (ref 5–40)

## 2021-03-20 LAB — CBC WITH DIFFERENTIAL/PLATELET
Basophils Absolute: 0.1 10*3/uL (ref 0.0–0.2)
Basos: 2 %
EOS (ABSOLUTE): 0.3 10*3/uL (ref 0.0–0.4)
Eos: 4 %
Hematocrit: 47 % — ABNORMAL HIGH (ref 34.0–46.6)
Hemoglobin: 15.5 g/dL (ref 11.1–15.9)
Immature Grans (Abs): 0 10*3/uL (ref 0.0–0.1)
Immature Granulocytes: 0 %
Lymphocytes Absolute: 2 10*3/uL (ref 0.7–3.1)
Lymphs: 29 %
MCH: 29.9 pg (ref 26.6–33.0)
MCHC: 33 g/dL (ref 31.5–35.7)
MCV: 91 fL (ref 79–97)
Monocytes Absolute: 0.7 10*3/uL (ref 0.1–0.9)
Monocytes: 11 %
Neutrophils Absolute: 3.5 10*3/uL (ref 1.4–7.0)
Neutrophils: 54 %
Platelets: 351 10*3/uL (ref 150–450)
RBC: 5.18 x10E6/uL (ref 3.77–5.28)
RDW: 12 % (ref 11.7–15.4)
WBC: 6.7 10*3/uL (ref 3.4–10.8)

## 2021-04-04 ENCOUNTER — Ambulatory Visit (INDEPENDENT_AMBULATORY_CARE_PROVIDER_SITE_OTHER): Payer: 59 | Admitting: Obstetrics & Gynecology

## 2021-04-04 ENCOUNTER — Other Ambulatory Visit: Payer: Self-pay

## 2021-04-04 ENCOUNTER — Encounter: Payer: Self-pay | Admitting: Obstetrics & Gynecology

## 2021-04-04 VITALS — BP 104/62 | HR 74 | Resp 16 | Ht 63.25 in | Wt 139.0 lb

## 2021-04-04 DIAGNOSIS — M810 Age-related osteoporosis without current pathological fracture: Secondary | ICD-10-CM | POA: Diagnosis not present

## 2021-04-04 DIAGNOSIS — N952 Postmenopausal atrophic vaginitis: Secondary | ICD-10-CM | POA: Diagnosis not present

## 2021-04-04 DIAGNOSIS — Z01419 Encounter for gynecological examination (general) (routine) without abnormal findings: Secondary | ICD-10-CM | POA: Diagnosis not present

## 2021-04-04 DIAGNOSIS — Z780 Asymptomatic menopausal state: Secondary | ICD-10-CM | POA: Diagnosis not present

## 2021-04-04 NOTE — Progress Notes (Signed)
EEVEE BORBON 1955/08/13 [id number]   History:    66 y.o. G2P2L2 Married   RP:  Established patient presenting for annual gyn exam    HPI: Postmenopause, well on no HRT.  No PMB.  No pelvic pain.  Occasionally sexually active.  On Vagifem twice a week.  Pap 11/2018 Neg.  Urine/BMs normal.  Breasts normal. Mammo 07/2020 Neg.  BMI 24.27. Walking 30 min every day. [facility]ed [facility] stopped Fosamax after 5 years.  Was offered Prolia, but decided to continue with a Biphosphanate, now on Boniva 150 mg monthly.   Last BD Osteoporosis 08/2020, stable with a T-Score of -2.5.  Health labs with Dr [facility].  Colono 07/2017.  Past medical history,surgical history, family history and social history were all reviewed and documented in the EPIC chart.  Gynecologic History No LMP recorded (lmp unknown). Patient is postmenopausal.  Obstetric History OB History  Gravida Para Term Preterm AB Living  2 2       2   SAB IAB Ectopic Multiple Live Births               # Outcome Date GA Lbr Len/2nd Weight Sex Delivery Anes PTL Lv  2 Para           1 Para              ROS: A ROS was performed and pertinent positives and negatives are included in the history.  GENERAL: No fevers or chills. HEENT: No change in vision, no earache, sore throat or sinus congestion. NECK: No pain or stiffness. CARDIOVASCULAR: No chest pain or pressure. No palpitations. PULMONARY: No shortness of breath, cough or wheeze. GASTROINTESTINAL: No abdominal pain, nausea, vomiting or diarrhea, melena or bright red blood per rectum. GENITOURINARY: No urinary frequency, urgency, hesitancy or dysuria. MUSCULOSKELETAL: No joint or muscle pain, no back pain, no recent trauma. DERMATOLOGIC: No rash, no itching, no lesions. ENDOCRINE: No polyuria, polydipsia, no heat or cold intolerance. No recent change in weight. HEMATOLOGICAL: No anemia or easy bruising or bleeding. NEUROLOGIC: No headache, seizures, numbness, tingling or  weakness. PSYCHIATRIC: No depression, no loss of interest in normal activity or change in sleep pattern.     Exam:   BP 104/62    Pulse 74    Resp 16    Ht 5' 3.25" (1.607 m)    Wt 139 lb (63 kg)    LMP  (LMP Unknown)    BMI 24.43 kg/m   Body mass index is 24.43 kg/m.  General appearance : Well developed well nourished female. No acute distress HEENT: Eyes: no retinal hemorrhage or exudates,  Neck supple, trachea midline, no carotid bruits, no thyroidmegaly Lungs: Clear to auscultation, no rhonchi or wheezes, or rib retractions  Heart: Regular rate and rhythm, no murmurs or gallops Breast:Examined in sitting and supine position were symmetrical in appearance, no palpable masses or tenderness,  no skin retraction, no nipple inversion, no nipple discharge, no skin discoloration, no axillary or supraclavicular lymphadenopathy Abdomen: no palpable masses or tenderness, no rebound or guarding Extremities: no edema or skin discoloration or tenderness  Pelvic: Vulva: Normal             Vagina: No gross lesions or discharge  Cervix: No gross lesions or discharge  Uterus  AV, normal size, shape and consistency, non-tender and mobile  Adnexa  Without masses or tenderness  Anus: Normal   Assessment/Plan:  66 y.o. female for annual exam   1.  Well female exam with routine gynecological exam Postmenopause, well on no HRT.  No PMB.  No pelvic pain.  Occasionally sexually active.  On Vagifem twice a week.  Pap 11/2018 Neg.  Urine/BMs normal.  Breasts normal. Mammo 07/2020 Neg. BMI 24.27. Walking 30 min every day. [facility]ed [facility] stopped Fosamax after 5 years.  Was offered Prolia, but decided to continue with a Biphosphanate, now on Boniva 150 mg monthly.   Last BD Osteoporosis 08/2020, stable with a T-Score of -2.5.  Health labs with Dr [facility].  Colono 07/2017.  2. Postmenopause Postmenopause, well on no HRT.  No PMB.  No pelvic pain.  Occasionally sexually active.  On Vagifem  twice a week  3. Postmenopausal atrophic vaginitis Continue on Yuvafem twice a week.  No CI.  Prescription sent in 01/2021.  4. Osteoporosis without current pathological fracture, unspecified osteoporosis type  Counseling on management of Osteoporosis.  Recommend to change to Prolia to have a Bisphosphonate holiday as the best choice.  Alternatively, she can continue on Boniva x 1 1/2 more year, until a BD is repeated in 08/2022.  She would then have to take a holiday, reaching 7 yrs on a Bisphosphonate. Vit D supplement, Ca++ 1.5 g/d total, regular weight bearing physical activities.  Princess Bruins MD, 12:04 PM 04/04/2021

## 2021-04-11 LAB — COLOGUARD: COLOGUARD: NEGATIVE

## 2021-07-30 DIAGNOSIS — Z1231 Encounter for screening mammogram for malignant neoplasm of breast: Secondary | ICD-10-CM | POA: Diagnosis not present

## 2021-07-30 LAB — HM MAMMOGRAPHY

## 2021-08-01 ENCOUNTER — Encounter: Payer: Self-pay | Admitting: Obstetrics & Gynecology

## 2021-08-08 ENCOUNTER — Encounter: Payer: Self-pay | Admitting: Family Medicine

## 2021-08-08 ENCOUNTER — Ambulatory Visit (INDEPENDENT_AMBULATORY_CARE_PROVIDER_SITE_OTHER): Payer: Medicare Other | Admitting: Family Medicine

## 2021-08-08 VITALS — BP 122/78 | HR 80 | Temp 97.9°F | Ht 63.5 in | Wt 140.2 lb

## 2021-08-08 DIAGNOSIS — L03011 Cellulitis of right finger: Secondary | ICD-10-CM | POA: Diagnosis not present

## 2021-08-08 MED ORDER — CEPHALEXIN 500 MG PO CAPS: 500.0000 mg | ORAL_CAPSULE | Freq: Three times a day (TID) | ORAL | 0 refills | Status: DC

## 2021-08-08 NOTE — Progress Notes (Signed)
Chief Complaint  Patient presents with   Infected finger    Right thumb infection. Noticed last night, worse today. Painful.     Last night she noticed soreness in her R thumb (radial aspect of the nail). Today she notes the pad of the thumb being sore, hurt to press on a tube. She cleaned with peroxide, alcohol, used neosporin. It seems worse this morning.  [place] is leaving [place] for [place] tomorrow. Admits to cutting her cuticles regularly.  PMH, PSH, SH reviewed  Outpatient Encounter Medications as of 08/08/2021  Medication Sig Note   Acetylcarnitine HCl (ACETYL L-CARNITINE PO) Take by mouth daily.    aspirin EC 81 MG tablet Take 81 mg by mouth daily.    b complex vitamins capsule Take 1 capsule by mouth once a week. Every other week    Cholecalciferol (VITAMIN D) 2000 UNITS CAPS Take 2,000 Units by mouth daily.    cycloSPORINE (RESTASIS) 0.05 % ophthalmic emulsion Place 1 drop into both eyes 2 (two) times daily.    famotidine (PEPCID) 20 MG tablet Take 10 mg by mouth daily.     latanoprost (XALATAN) 0.005 % ophthalmic solution Place 1 drop into both eyes at bedtime.    Omega-3 Fatty Acids (FISH OIL PO) Take 1 capsule by mouth daily.    vitamin C (ASCORBIC ACID) 500 MG tablet Take 500 mg by mouth daily.    YUVAFEM 10 MCG TABS vaginal tablet PLACE 1 INSERT VAGINALLY TWICE WEEKLY ON TUESDAYS AND FRIDAYS    ibandronate (BONIVA) 150 MG tablet Take 1 tablet (150 mg total) by mouth every 30 (thirty) days. Take in the morning with a full glass of water, on an empty stomach, and do not take anything else by mouth or lie down for the next 30 min. (Patient not taking: Reported on 08/08/2021) 08/08/2021: Has forgotten to take it, not yet restarted   Magnesium 250 MG TABS Take 250 mg by mouth. WEEKLY (Patient not taking: Reported on 08/08/2021)    No facility-administered encounter medications on file as of 08/08/2021.   No Known Allergies  ROS: No f/c, no n/v/d, no URI symptoms. Denies dizziness, GI  issues.  PHYSICAL EXAM:  BP 122/78   Pulse 80   Temp 97.9 F (36.6 C) (Tympanic)   Ht 5' 3.5" (1.613 m)   Wt 140 lb 3.2 oz (63.6 kg)   LMP  (LMP Unknown)   BMI 24.45 kg/m   Well-appearing, pleasant female, in good spirits, in no distress RUE:  Ulnar aspect of R thumbnail, there is some very mild swelling, no significant erythema or warmth (slight).  There does appear to be an inflamed/cut cuticle.  There is no drainage or crusting.  There is some some swelling of the thumb pad (asymmetric compared to the left thumb), and it is tender to press on this area. 2+ radial pulse, brisk capillary refill. Normal strength, sensation.   ASSESSMENT/PLAN:  Paronychia of finger of right hand - infection appears mild, but swelling/pain in the thumb pad is concerning. Soaks TID, and start ABX if not improving. - Plan: cephALEXin (KEFLEX) 500 MG capsule

## 2021-08-08 NOTE — Patient Instructions (Signed)
Please do warm soapy soaks three times/day. If you don't see significant improvement in the next 24 hours (especially with respect to the pain and swelling in the thumb pad), then start the antibiotics and take the full course. Continue to soak it while on the antibiotics.

## 2021-08-09 DIAGNOSIS — L03011 Cellulitis of right finger: Secondary | ICD-10-CM | POA: Diagnosis not present

## 2021-08-21 DIAGNOSIS — D225 Melanocytic nevi of trunk: Secondary | ICD-10-CM | POA: Diagnosis not present

## 2021-08-21 DIAGNOSIS — L814 Other melanin hyperpigmentation: Secondary | ICD-10-CM | POA: Diagnosis not present

## 2021-08-21 DIAGNOSIS — L578 Other skin changes due to chronic exposure to nonionizing radiation: Secondary | ICD-10-CM | POA: Diagnosis not present

## 2021-08-21 DIAGNOSIS — L820 Inflamed seborrheic keratosis: Secondary | ICD-10-CM | POA: Diagnosis not present

## 2021-08-21 DIAGNOSIS — L821 Other seborrheic keratosis: Secondary | ICD-10-CM | POA: Diagnosis not present

## 2021-09-06 ENCOUNTER — Encounter: Payer: Self-pay | Admitting: Physician Assistant

## 2021-09-06 ENCOUNTER — Ambulatory Visit
Admission: RE | Admit: 2021-09-06 | Discharge: 2021-09-06 | Disposition: A | Discharge status: Home / Self Care | Payer: Medicare Other | Source: Ambulatory Visit | Attending: Physician Assistant | Admitting: Physician Assistant

## 2021-09-06 ENCOUNTER — Ambulatory Visit (INDEPENDENT_AMBULATORY_CARE_PROVIDER_SITE_OTHER): Payer: Medicare Other | Admitting: Physician Assistant

## 2021-09-06 VITALS — BP 110/60 | HR 61 | Ht 63.5 in | Wt 141.0 lb

## 2021-09-06 DIAGNOSIS — M79671 Pain in right foot: Secondary | ICD-10-CM | POA: Diagnosis not present

## 2021-09-06 DIAGNOSIS — M7989 Other specified soft tissue disorders: Secondary | ICD-10-CM | POA: Diagnosis not present

## 2021-09-06 DIAGNOSIS — S9031XA Contusion of right foot, initial encounter: Secondary | ICD-10-CM | POA: Diagnosis not present

## 2021-09-06 DIAGNOSIS — M79674 Pain in right toe(s): Secondary | ICD-10-CM

## 2021-09-06 DIAGNOSIS — S90121A Contusion of right lesser toe(s) without damage to nail, initial encounter: Secondary | ICD-10-CM

## 2021-09-06 NOTE — Progress Notes (Signed)
Acute Office Visit  Subjective:    Patient ID: [name], female    DOB: [birth year], 66 y.o.   MRN: [id number]  Chief Complaint  Patient presents with   Acute Visit    Hit right foot on heavy chair. She is having trouble walking and her foot is bruised.    HPI Patient is in today for right foot pain after accidentally hitting it on a chair last night; foot is bruised and swollen, with most of the pain at the second toe; can bear weight on foot; icing foot was somewhat helpful.  Outpatient Medications Prior to Visit  Medication Sig Dispense Refill   Acetylcarnitine HCl (ACETYL L-CARNITINE PO) Take by mouth daily.     aspirin EC 81 MG tablet Take 81 mg by mouth daily.     Cholecalciferol (VITAMIN D) 2000 UNITS CAPS Take 2,000 Units by mouth daily.     cycloSPORINE (RESTASIS) 0.05 % ophthalmic emulsion Place 1 drop into both eyes 2 (two) times daily.     famotidine (PEPCID) 20 MG tablet Take 10 mg by mouth daily.      latanoprost (XALATAN) 0.005 % ophthalmic solution Place 1 drop into both eyes at bedtime.     Omega-3 Fatty Acids (FISH OIL PO) Take 1 capsule by mouth daily.     vitamin C (ASCORBIC ACID) 500 MG tablet Take 500 mg by mouth daily.     YUVAFEM 10 MCG TABS vaginal tablet PLACE 1 INSERT VAGINALLY TWICE WEEKLY ON TUESDAYS AND FRIDAYS 8 tablet 14   b complex vitamins capsule Take 1 capsule by mouth once a week. Every other week (Patient not taking: Reported on 09/06/2021)     cephALEXin (KEFLEX) 500 MG capsule Take 1 capsule (500 mg total) by mouth 3 (three) times daily. (Patient not taking: Reported on 09/06/2021) 30 capsule 0   ibandronate (BONIVA) 150 MG tablet Take 1 tablet (150 mg total) by mouth every 30 (thirty) days. Take in the morning with a full glass of water, on an empty stomach, and do not take anything else by mouth or lie down for the next 30 min. (Patient not taking: Reported on 08/08/2021) 3 tablet 3   Magnesium 250 MG TABS Take 250 mg by mouth. WEEKLY (Patient  not taking: Reported on 08/08/2021)     No facility-administered medications prior to visit.    No Known Allergies  Review of Systems  Constitutional:  Negative for activity change and chills.  HENT:  Negative for congestion and voice change.   Eyes:  Negative for pain and redness.  Respiratory:  Negative for cough and wheezing.   Cardiovascular:  Negative for chest pain.  Gastrointestinal:  Negative for constipation, diarrhea, nausea and vomiting.  Endocrine: Negative for polyuria.  Genitourinary:  Negative for frequency.  Musculoskeletal:  Positive for arthralgias, gait problem and joint swelling.  Skin:  Negative for color change and rash.  Allergic/Immunologic: Negative for immunocompromised state.  Neurological:  Negative for dizziness.  Psychiatric/Behavioral:  Negative for agitation.        Objective:    Physical Exam Vitals and nursing note reviewed.  Constitutional:      General: She is not in acute distress.    Appearance: Normal appearance. She is not ill-appearing.  HENT:     Head: Normocephalic and atraumatic.     Right Ear: External ear normal.     Left Ear: External ear normal.     Nose: No congestion.  Eyes:     Extraocular  Movements: Extraocular movements intact.     Conjunctiva/sclera: Conjunctivae normal.     Pupils: Pupils are equal, round, and reactive to light.  Cardiovascular:     Rate and Rhythm: Normal rate and regular rhythm.     Pulses: Normal pulses.          Dorsalis pedis pulses are 2+ on the right side.       Posterior tibial pulses are 2+ on the right side.     Heart sounds: Normal heart sounds.  Pulmonary:     Effort: Pulmonary effort is normal.     Breath sounds: Normal breath sounds. No wheezing.  Abdominal:     General: Bowel sounds are normal.     Palpations: Abdomen is soft.  Musculoskeletal:     Cervical back: Normal range of motion and neck supple.     Right lower leg: No edema.     Left lower leg: No edema.     Right foot:  Decreased range of motion. No deformity.       Feet:  Feet:     Right foot:     Skin integrity: No skin breakdown.     Toenail Condition: Right toenails are normal.     Comments: Right foot 2nd toe contusion, edema, no erythema, guarded gait Skin:    General: Skin is warm and dry.     Findings: No bruising.  Neurological:     General: No focal deficit present.     Mental Status: She is alert and oriented to person, place, and time.  Psychiatric:        Mood and Affect: Mood normal.        Behavior: Behavior normal.        Thought Content: Thought content normal.     BP 110/60   Pulse 61   Wt 141 lb (64 kg)   LMP  (LMP Unknown)   SpO2 98%   BMI 24.59 kg/m   Wt Readings from Last 3 Encounters:  09/06/21 141 lb (64 kg)  08/08/21 140 lb 3.2 oz (63.6 kg)  04/04/21 139 lb (63 kg)    Results for orders placed or performed in visit on 08/14/21  [facility] MAMMOGRAPHY  Result Value Ref Range   HM Mammogram 0-4 Bi-Rad 0-4 Bi-Rad, Self Reported Normal       Assessment & Plan:  1. Foot pain, right - DG Foot Complete Right; Future  2. Toe pain, right - DG Foot Complete Right; Future  3. Contusion of lesser toe of right foot without damage to nail, initial encounter  - buddy tape toes, tylenol, right foot x-ray - If symptoms get worse, please call the office for a follow up appointment if available, otherwise go to Urgent Care, call 911 / EMS, or go to the Emergency Department for further evaluation.   No orders of the defined types were placed in this encounter.   Return for Return as Already Scheduled.  Irene Pap, PA-C

## 2021-09-06 NOTE — Patient Instructions (Addendum)
You can walk in for x-ray at ---  [facility] [address], [place] [address]  Phone [phone] Fax [phone]  Hours of Operation General hours of operation are Monday - Friday, 8 am-5 pm  If symptoms get worse, please call the office for a follow up appointment if available, otherwise go to Urgent Care, call 911 / EMS, or go to the Emergency Department for further evaluation.

## 2021-09-12 DIAGNOSIS — H35372 Puckering of macula, left eye: Secondary | ICD-10-CM | POA: Diagnosis not present

## 2021-09-12 DIAGNOSIS — H401131 Primary open-angle glaucoma, bilateral, mild stage: Secondary | ICD-10-CM | POA: Diagnosis not present

## 2021-09-12 DIAGNOSIS — H5213 Myopia, bilateral: Secondary | ICD-10-CM | POA: Diagnosis not present

## 2021-09-12 DIAGNOSIS — H2513 Age-related nuclear cataract, bilateral: Secondary | ICD-10-CM | POA: Diagnosis not present

## 2021-11-08 ENCOUNTER — Ambulatory Visit: Payer: Medicare Other | Admitting: Medical

## 2021-11-13 ENCOUNTER — Encounter: Payer: Self-pay | Admitting: Internal Medicine

## 2021-11-15 ENCOUNTER — Encounter: Payer: Self-pay | Admitting: Family Medicine

## 2021-11-21 ENCOUNTER — Ambulatory Visit (INDEPENDENT_AMBULATORY_CARE_PROVIDER_SITE_OTHER): Payer: Medicare Other | Admitting: Family Medicine

## 2021-11-21 ENCOUNTER — Encounter: Payer: Self-pay | Admitting: Family Medicine

## 2021-11-21 VITALS — BP 112/72 | HR 83 | Temp 98.4°F | Wt 139.4 lb

## 2021-11-21 DIAGNOSIS — J40 Bronchitis, not specified as acute or chronic: Secondary | ICD-10-CM | POA: Diagnosis not present

## 2021-11-21 MED ORDER — AZITHROMYCIN 500 MG PO TABS: 500.0000 mg | ORAL_TABLET | Freq: Every day | ORAL | 0 refills | Status: DC

## 2021-11-21 MED ORDER — BENZONATATE 100 MG PO CAPS: 200.0000 mg | ORAL_CAPSULE | Freq: Three times a day (TID) | ORAL | 0 refills | Status: DC | PRN

## 2021-11-21 NOTE — Progress Notes (Signed)
   Subjective:    Patient ID: [name], female    DOB: Jul 30, 1955, 66 y.o.   MRN: [id number]  HPI She complains of a 3-week history of a dry cough.  She initially had a sore throat with this and the cough was mainly in the evening and now the cough is gone away in the evening and occurs mainly during the day.  No fever, chills, earache.  She has had the same problem in the past and it took a long time for the cough to go away.  In the past an inhaler was used without much success.  She does have an impending trip to [place] coming up.   Review of Systems     Objective:   Physical Exam Alert and in no distress. Tympanic membranes and canals are normal. Pharyngeal area is normal. Neck is supple without adenopathy or thyromegaly. Cardiac exam shows a regular sinus rhythm without murmurs or gallops. Lungs are clear to auscultation.        Assessment & Plan:  Bronchitis - Plan: benzonatate (TESSALON) 100 MG capsule, azithromycin (ZITHROMAX) 500 MG tablet Since she has an impending trip I think is reasonable to aggressively treat this in case it is related to a bacterial infection.  She was comfortable with this.

## 2021-12-17 ENCOUNTER — Encounter: Payer: Self-pay | Admitting: Internal Medicine

## 2021-12-30 ENCOUNTER — Encounter: Payer: Self-pay | Admitting: Internal Medicine

## 2022-02-01 ENCOUNTER — Other Ambulatory Visit: Payer: Self-pay | Admitting: Obstetrics & Gynecology

## 2022-02-20 ENCOUNTER — Ambulatory Visit (INDEPENDENT_AMBULATORY_CARE_PROVIDER_SITE_OTHER): Payer: Medicare Other | Admitting: Family Medicine

## 2022-02-20 ENCOUNTER — Encounter: Payer: Self-pay | Admitting: Family Medicine

## 2022-02-20 ENCOUNTER — Ambulatory Visit
Admission: RE | Admit: 2022-02-20 | Discharge: 2022-02-20 | Disposition: A | Discharge status: Home / Self Care | Payer: Medicare Other | Source: Ambulatory Visit | Attending: Family Medicine | Admitting: Family Medicine

## 2022-02-20 VITALS — BP 110/70 | HR 79 | Wt 143.0 lb

## 2022-02-20 DIAGNOSIS — S92154A Nondisplaced avulsion fracture (chip fracture) of right talus, initial encounter for closed fracture: Secondary | ICD-10-CM

## 2022-02-20 DIAGNOSIS — M79671 Pain in right foot: Secondary | ICD-10-CM

## 2022-02-20 NOTE — Progress Notes (Signed)
   Subjective:    Patient ID: [name], female    DOB: [birth year], 66 y.o.   MRN: [id number]  HPI She fell yesterday going down some steps at her mother's house but does not remember the exact mechanism of injury.  She has been limping on the foot.  Complains of pain over the forefoot area.   Review of Systems     Objective:   Physical Exam Exam of the right foot shows full motion of the ankle.  No tenderness over medial or lateral malleolus.  No laxity noted.  Questionable tenderness over the mid forefoot area with compression.  No tenderness over the toes.       Assessment & Plan:  Right foot pain - Plan: DG Foot Complete Right Recommend ice 20 minutes 3 times per day for the first 48 hours then switching to heat.  Tylenol 2 tablets 4 times per day and may supplement with NSAID.  Explained that this is all based on negative x-ray.

## 2022-02-21 ENCOUNTER — Telehealth: Payer: Self-pay

## 2022-02-21 NOTE — Addendum Note (Signed)
Addended by: Denita Lung on: 02/21/2022 02:59 PM   Modules accepted: Orders

## 2022-02-21 NOTE — Telephone Encounter (Signed)
Patient has been advised of x ary results. STAT Referral is being worked on by Pharmacist, hospital. Per Caryn, patient may not be able to get an appointment with orthopedic until Monday. Patient has been advised. Patient wants to know is she should purchase a boot or wear a stabilizing device until she is seen by Orthopedics? Patient is ok with you sending her a Pharmacist, community message with your recommendations.

## 2022-02-24 ENCOUNTER — Ambulatory Visit (INDEPENDENT_AMBULATORY_CARE_PROVIDER_SITE_OTHER): Payer: Medicare Other | Admitting: Orthopedic Surgery

## 2022-02-24 ENCOUNTER — Encounter: Payer: Self-pay | Admitting: Orthopedic Surgery

## 2022-02-24 DIAGNOSIS — S93601A Unspecified sprain of right foot, initial encounter: Secondary | ICD-10-CM

## 2022-02-24 NOTE — Progress Notes (Signed)
Office Visit Note   Patient: [name]           Date of Birth: 27-Oct-1955           MRN: [id number] Visit Date: 02/24/2022              Requested by: Denita Lung, MD [address] [place],  [place] [address] PCP: Denita Lung, MD  Chief Complaint  Patient presents with   Right Foot - Pain      HPI: Patient is a 66 year old woman who is seen for initial evaluation for right foot and ankle injury.  Patient states she fell going down steps while holding a box she states that her foot folded behind her.  Patient presents with swelling medial in the heel and dorsally over the foot.  Assessment & Plan: Visit Diagnoses:  1. Sprain of right foot, initial encounter     Plan: Patient has a avulsion fracture dorsally off the navicular head.  We will place her in a short fracture boot she will wear this and increase her activities as tolerated reevaluate in 4 weeks with repeat three-view radiographs of the right foot.  Follow-Up Instructions: No follow-ups on file.   Ortho Exam  Patient is alert, oriented, no adenopathy, well-dressed, normal affect, normal respiratory effort. Examination patient has a palpable pulse she has good ankle and subtalar motion.  There is ecchymosis and bruising dorsally over the foot as well as medially over the heel.  Patient has no pain with distraction across the Lisfranc joint.  Her medial and lateral ankle ligaments are stable anterior drawer is stable.  Review of the radiographs shows an avulsion fracture dorsally off the talar head.  No widening of the Lisfranc complex.  No evidence of an ankle fracture.  Imaging: No results found. No images are attached to the encounter.  Labs: Lab Results  Component Value Date   ESRSEDRATE 2 07/24/2016   CRP 1.5 07/24/2016     Lab Results  Component Value Date   ALBUMIN 4.8 03/19/2021   ALBUMIN 4.8 03/05/2020   ALBUMIN 4.9 (H) 03/01/2019    No results found for: "MG" Lab Results   Component Value Date   VD25OH 46.7 10/16/2020   VD25OH 38.8 03/01/2019    No results found for: "PREALBUMIN"    Latest Ref Rng & Units 03/19/2021   10:38 AM 03/05/2020   10:29 AM 03/01/2019    9:58 AM  CBC EXTENDED  WBC 3.4 - 10.8 x10E3/uL 6.7  7.2  6.8   RBC 3.77 - 5.28 x10E6/uL 5.18  5.12  5.04   Hemoglobin 11.1 - 15.9 g/dL 15.5  15.1  15.3   HCT 34.0 - 46.6 % 47.0  46.3  46.2   Platelets 150 - 450 x10E3/uL 351  304  331   NEUT# 1.4 - 7.0 x10E3/uL 3.5  3.8  3.4   Lymph# 0.7 - 3.1 x10E3/uL 2.0  2.1  2.2      There is no height or weight on file to calculate BMI.  Orders:  No orders of the defined types were placed in this encounter.  No orders of the defined types were placed in this encounter.    Procedures: No procedures performed  Clinical Data: No additional findings.  ROS:  All other systems negative, except as noted in the HPI. Review of Systems  Objective: Vital Signs: LMP  (LMP Unknown)   Specialty Comments:  No specialty comments available.  PMFS History: Patient Active Problem  List   Diagnosis Date Noted   Herpes labialis 02/16/2021   Gastroesophageal reflux disease without esophagitis 02/15/2018   Lactose intolerance 02/15/2018   History of vitamin D deficiency 02/15/2018   Osteoporosis 02/15/2018   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   Spondylosis 05/26/2007   Disorder of gallbladder 03/11/2007   Past Medical History:  Diagnosis Date   Anxiety    very high   GERD (gastroesophageal reflux disease)    Glaucoma    Heart murmur    Pt saw Cardiologist when she was in her 70s, not since.   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   Osteoporosis    Scoliosis     Family History  Problem Relation Age of Onset   Arthritis Mother    Hyperlipidemia Mother    Atrial fibrillation Mother    Skin cancer Father    Depression Father    Parkinson's disease Father    CAD Father    Colon cancer  Neg Hx    Colon polyps Neg Hx    Esophageal cancer Neg Hx    Stomach cancer Neg Hx    Rectal cancer Neg Hx     Past Surgical History:  Procedure Laterality Date   CESAREAN SECTION  1988   SHOULDER ARTHROSCOPY WITH SUBACROMIAL DECOMPRESSION AND OPEN ROTATOR C Right 12/09/2013   Procedure: RIGHT SHOULDER ARTHROSCOPY WITH SUBACROMIAL DECOMPRESSION AND MINI OPEN ROTATOR CUFF REPAIR, AND BICEPS TENDODESIS;  Surgeon: Augustin Schooling, MD;  Location: [facility];  Service: Orthopedics;  Laterality: Right;   TONSILLECTOMY     Social History   Occupational History   Occupation: Management/Librarian  Tobacco Use   Smoking status: Never   Smokeless tobacco: Never  Vaping Use   Vaping Use: Never used  Substance and Sexual Activity   Alcohol use: Yes    Comment: occ   Drug use: No   Sexual activity: Yes    Partners: Male    Birth control/protection: Post-menopausal    Comment: 1st intercourse- 23, partner- 1

## 2022-03-25 DIAGNOSIS — E785 Hyperlipidemia, unspecified: Secondary | ICD-10-CM | POA: Insufficient documentation

## 2022-03-26 ENCOUNTER — Encounter: Payer: Self-pay | Admitting: Family Medicine

## 2022-03-26 ENCOUNTER — Ambulatory Visit (INDEPENDENT_AMBULATORY_CARE_PROVIDER_SITE_OTHER): Payer: Medicare Other | Admitting: Family Medicine

## 2022-03-26 VITALS — BP 112/70 | HR 80 | Temp 97.8°F | Ht 63.25 in | Wt 143.6 lb

## 2022-03-26 DIAGNOSIS — M810 Age-related osteoporosis without current pathological fracture: Secondary | ICD-10-CM

## 2022-03-26 DIAGNOSIS — Z0000 Encounter for general adult medical examination without abnormal findings: Secondary | ICD-10-CM | POA: Diagnosis not present

## 2022-03-26 DIAGNOSIS — Z8639 Personal history of other endocrine, nutritional and metabolic disease: Secondary | ICD-10-CM

## 2022-03-26 DIAGNOSIS — Z23 Encounter for immunization: Secondary | ICD-10-CM

## 2022-03-26 DIAGNOSIS — E785 Hyperlipidemia, unspecified: Secondary | ICD-10-CM | POA: Diagnosis not present

## 2022-03-26 DIAGNOSIS — K219 Gastro-esophageal reflux disease without esophagitis: Secondary | ICD-10-CM

## 2022-03-26 DIAGNOSIS — H401131 Primary open-angle glaucoma, bilateral, mild stage: Secondary | ICD-10-CM | POA: Diagnosis not present

## 2022-03-26 DIAGNOSIS — Z7982 Long term (current) use of aspirin: Secondary | ICD-10-CM | POA: Diagnosis not present

## 2022-03-26 DIAGNOSIS — E739 Lactose intolerance, unspecified: Secondary | ICD-10-CM

## 2022-03-26 NOTE — Progress Notes (Signed)
[name] is a 67 y.o. female who presents for inital welcome to Medicare and follow-up on chronic medical conditions.  She does have a history of osteoporosis and in the past had been on a bisphosphonate.  She was recently given Boniva but has not been taking this regularly.  She is concerned over being on this for more than 5 years.  She does have a history of vitamin D deficiency.  She is at that threshold at the present time.  She is doing nicely on Pepcid controlling her reflux symptoms.  She does have issues with lactose intolerance but usually stays away from this.  She follows up with Dr. Stanford Breed concerning cardiac issues.  In the past she did have an episode of right shin pain and she was using gabapentin 300 3 times daily but has not been using this recently.  She does have concerns over this occurring again and is held onto some gabapentin. Immunizations and Health Maintenance Immunization History  Administered Date(s) Administered   Influenza, High Dose Seasonal PF 11/15/2021   Influenza,inj,Quad PF,6+ Mos 11/26/2016, 10/31/2017, 10/22/2018, 12/02/2019, 12/19/2020   PFIZER Comirnaty(Gray Top)Covid-19 Tri-Sucrose Vaccine 09/27/2020   PFIZER(Purple Top)SARS-COV-2 Vaccination 05/15/2019, 06/14/2019, 01/03/2020   PNEUMOCOCCAL CONJUGATE-20 03/26/2022   Tdap 05/21/2017   Unspecified SARS-COV-2 Vaccination 12/20/2021   Zoster Recombinat (Shingrix) 02/15/2018, 04/19/2018   Health Maintenance Due  Topic Date Due   Medicare Annual Wellness (AWV)  Never done   PAP SMEAR-Modifier  12/01/2021    Last Pap smear: done through GYN Last mammogram: done through GYN Last colonoscopy: 07/2017, Cologuard 04/04/2021 Last DEXA: 08/28/2020 Dentist: within the last year Ophtho: within the last year Exercise: not since recent fall  Other doctors caring for patient include:  Lelon Perla, MD Cardiology  Dr. Dellis Filbert                                        Gynecology  Advanced directives: No  info given    Depression screen:  See questionnaire below.     03/26/2022    9:31 AM 03/19/2021    9:43 AM 03/05/2020    9:34 AM 03/01/2019    9:51 AM 02/15/2018    2:09 PM  Depression screen PHQ 2/9  Decreased Interest 0 0 0 0 0  Down, Depressed, Hopeless 0 0 0 0 0  PHQ - 2 Score 0 0 0 0 0    Fall Risk Screen: see questionnaire below.    03/26/2022    9:31 AM 03/19/2021    9:43 AM  Fall Risk   Falls in the past year? 1 0  Number falls in past yr: 0 0  Injury with Fall? 1 0  Risk for fall due to : No Fall Risks No Fall Risks  Follow up Falls evaluation completed Falls evaluation completed    ADL screen:  See questionnaire below Functional Status Survey: Is the patient deaf or have difficulty hearing?: No Does the patient have difficulty seeing, even when wearing glasses/contacts?: No Does the patient have difficulty concentrating, remembering, or making decisions?: No Does the patient have difficulty walking or climbing stairs?: No Does the patient have difficulty dressing or bathing?: No Does the patient have difficulty doing errands alone such as visiting a doctor's office or shopping?: No   Review of Systems Constitutional: -, -unexpected weight change, -anorexia, -fatigue Allergy: -sneezing, -itching, -congestion Dermatology: denies changing moles, rash,  lumps ENT: -runny nose, -ear pain, -sore throat,  Cardiology:  -chest pain, -palpitations, -orthopnea, Respiratory: -cough, -shortness of breath, -dyspnea on exertion, -wheezing,  Gastroenterology: -abdominal pain, -nausea, -vomiting, -diarrhea, -constipation, -dysphagia Hematology: -bleeding or bruising problems Musculoskeletal: -arthralgias, -myalgias, -joint swelling, -back pain, - Ophthalmology: -vision changes,  Urology: -dysuria, -difficulty urinating,  -urinary frequency, -urgency, incontinence Neurology: -, -numbness, , -memory loss, -falls, -dizziness    PHYSICAL EXAM:  BP 112/70   Pulse 80   Temp  97.8 F (36.6 C) (Oral)   Ht 5' 3.25" (1.607 m)   Wt 143 lb 9.6 oz (65.1 kg)   LMP  (LMP Unknown)   SpO2 99% Comment: room air  BMI 25.24 kg/m   General Appearance: Alert, cooperative, no distress, appears stated age Head: Normocephalic, without obvious abnormality, atraumatic Eyes: PERRL, conjunctiva/corneas clear, EOM's intact,  Ears: Normal TM's and external ear canals Nose: Nares normal, mucosa normal, no drainage or sinus tenderness Throat: Lips, mucosa, and tongue normal; teeth and gums normal Neck: Supple, no lymphadenopathy;  thyroid:  no enlargement/tenderness/nodules; no carotid bruit or JVD Lungs: Clear to auscultation bilaterally without wheezes, rales or ronchi; respirations unlabored Heart: Regular rate and rhythm, S1 and S2 normal, no murmur, rubor gallop Abdomen: Soft, non-tender, nondistended, normoactive bowel sounds,  no masses, no hepatosplenomegaly Extremities: No clubbing, cyanosis or edema Pulses: 2+ and symmetric all extremities Skin:  Skin color, texture, turgor normal, no rashes or lesions Lymph nodes: Cervical, supraclavicular, and axillary nodes normal Neurologic:  CNII-XII intact, normal strength, sensation and gait; reflexes 2+ and symmetric throughout Psych: Normal mood, affect, hygiene and grooming.  ASSESSMENT/PLAN: Osteoporosis without current pathological fracture, unspecified osteoporosis type - Plan: DG Bone Density  Routine general medical examination at a health care facility - Plan: CBC with Differential/Platelet, Comprehensive metabolic panel, Lipid panel  Gastroesophageal reflux disease without esophagitis  History of vitamin D deficiency - Plan: VITAMIN D 25 Hydroxy (Vit-D Deficiency, Fractures)  Lactose intolerance  Hyperlipidemia, unspecified hyperlipidemia type - Plan: Lipid panel  Need for vaccination against Streptococcus pneumoniae - Plan: Pneumococcal conjugate vaccine 20-valent (Prevnar 20)    Discussed monthly self breast  exams and yearly mammograms; at least 30 minutes of aerobic activity at least 5 days/week and weight-bearing exercise 2x/week; proper sunscreen use reviewed; healthy diet, including goals of calcium and vitamin D intake and alcohol recommendations (less than or equal to 1 drink/day) reviewed; regular seatbelt use; changing batteries in smoke detectors.  Immunization recommendations discussed.  Colonoscopy recommendations reviewed   Medicare Attestation I have personally reviewed: The patient's medical and social history Their use of alcohol, tobacco or illicit drugs Their current medications and supplements The patient's functional ability including ADLs,fall risks, home safety risks, cognitive, and hearing and visual impairment Diet and physical activities Evidence for depression or mood disorders  The patient's weight, height, and BMI have been recorded in the chart.  I have made referrals, counseling, and provided education to the patient based on review of the above and I have provided the patient with a written personalized care plan for preventive services.     Jill Alexanders, MD   03/26/2022

## 2022-03-26 NOTE — Patient Instructions (Signed)
  Ms. Baril , Thank you for taking time to come for your Medicare Wellness Visit. I appreciate your ongoing commitment to your health goals. Please review the following plan we discussed and let me know if I can assist you in the future.   These are the goals we discussed:  Goals   None     This is a list of the screening recommended for you and due dates:  Health Maintenance  Topic Date Due   Medicare Annual Wellness Visit  Never done   Pneumonia Vaccine (1 - PCV) Never done   Pap Smear  12/01/2021   COVID-19 Vaccine (6 - 2023-24 season) 06/24/2022*   Mammogram  08/02/2023   Cologuard (Stool DNA test)  04/04/2024   DTaP/Tdap/Td vaccine (2 - Td or Tdap) 05/22/2027   Flu Shot  Completed   DEXA scan (bone density measurement)  Completed   Hepatitis C Screening: USPSTF Recommendation to screen - Ages 59-79 yo.  Completed   Zoster (Shingles) Vaccine  Completed   HPV Vaccine  Aged Out   Colon Cancer Screening  Discontinued   HIV Screening  Discontinued  *Topic was postponed. The date shown is not the original due date.

## 2022-03-27 ENCOUNTER — Encounter: Payer: Self-pay | Admitting: Orthopedic Surgery

## 2022-03-27 ENCOUNTER — Encounter: Payer: Self-pay | Admitting: Family Medicine

## 2022-03-27 ENCOUNTER — Ambulatory Visit (INDEPENDENT_AMBULATORY_CARE_PROVIDER_SITE_OTHER): Payer: Medicare Other

## 2022-03-27 ENCOUNTER — Ambulatory Visit: Payer: Medicare Other | Admitting: Orthopedic Surgery

## 2022-03-27 DIAGNOSIS — S93601A Unspecified sprain of right foot, initial encounter: Secondary | ICD-10-CM

## 2022-03-27 LAB — CBC WITH DIFFERENTIAL/PLATELET
Basophils Absolute: 0.1 10*3/uL (ref 0.0–0.2)
Basos: 2 %
EOS (ABSOLUTE): 0.3 10*3/uL (ref 0.0–0.4)
Eos: 5 %
Hematocrit: 45.5 % (ref 34.0–46.6)
Hemoglobin: 15.1 g/dL (ref 11.1–15.9)
Immature Grans (Abs): 0 10*3/uL (ref 0.0–0.1)
Immature Granulocytes: 0 %
Lymphocytes Absolute: 1.8 10*3/uL (ref 0.7–3.1)
Lymphs: 28 %
MCH: 29.5 pg (ref 26.6–33.0)
MCHC: 33.2 g/dL (ref 31.5–35.7)
MCV: 89 fL (ref 79–97)
Monocytes Absolute: 0.7 10*3/uL (ref 0.1–0.9)
Monocytes: 10 %
Neutrophils Absolute: 3.4 10*3/uL (ref 1.4–7.0)
Neutrophils: 55 %
Platelets: 319 10*3/uL (ref 150–450)
RBC: 5.12 x10E6/uL (ref 3.77–5.28)
RDW: 11.7 % (ref 11.7–15.4)
WBC: 6.3 10*3/uL (ref 3.4–10.8)

## 2022-03-27 LAB — COMPREHENSIVE METABOLIC PANEL
ALT: 11 IU/L (ref 0–32)
AST: 16 IU/L (ref 0–40)
Albumin/Globulin Ratio: 2 (ref 1.2–2.2)
Albumin: 4.5 g/dL (ref 3.9–4.9)
Alkaline Phosphatase: 62 IU/L (ref 44–121)
BUN/Creatinine Ratio: 14 (ref 12–28)
BUN: 9 mg/dL (ref 8–27)
Bilirubin Total: 0.4 mg/dL (ref 0.0–1.2)
CO2: 23 mmol/L (ref 20–29)
Calcium: 9.2 mg/dL (ref 8.7–10.3)
Chloride: 101 mmol/L (ref 96–106)
Creatinine, Ser: 0.63 mg/dL (ref 0.57–1.00)
Globulin, Total: 2.3 g/dL (ref 1.5–4.5)
Glucose: 87 mg/dL (ref 70–99)
Potassium: 4.2 mmol/L (ref 3.5–5.2)
Sodium: 139 mmol/L (ref 134–144)
Total Protein: 6.8 g/dL (ref 6.0–8.5)
eGFR: 98 mL/min/{1.73_m2} (ref >59)

## 2022-03-27 LAB — LIPID PANEL
Chol/HDL Ratio: 3.1 ratio (ref 0.0–4.4)
Cholesterol, Total: 213 mg/dL — ABNORMAL HIGH (ref 100–199)
HDL: 68 mg/dL (ref >39)
LDL Chol Calc (NIH): 133 mg/dL — ABNORMAL HIGH (ref 0–99)
Triglycerides: 65 mg/dL (ref 0–149)
VLDL Cholesterol Cal: 12 mg/dL (ref 5–40)

## 2022-03-27 LAB — VITAMIN D 25 HYDROXY (VIT D DEFICIENCY, FRACTURES): Vit D, 25-Hydroxy: 34.7 ng/mL (ref 30.0–100.0)

## 2022-03-27 NOTE — Progress Notes (Signed)
Office Visit Note   Patient: [name]           Date of Birth: 1956/01/31           MRN: [id number] Visit Date: 03/27/2022              Requested by: Denita Lung, MD [address] [place],  [place] [address] PCP: Denita Lung, MD  Chief Complaint  Patient presents with   Right Foot - Follow-up      HPI: Patient is a 67 year old woman who presents in follow-up status post avulsion injury dorsum of the talar head right foot.  Patient has been in a fracture boot doing well, she states the pain is decreased.  Assessment & Plan: Visit Diagnoses:  1. Sprain of right foot, initial encounter     Plan: Patient will advance to regular shoewear and increase her activities as tolerated.  Follow-Up Instructions: Return if symptoms worsen or fail to improve.   Ortho Exam  Patient is alert, oriented, no adenopathy, well-dressed, normal affect, normal respiratory effort. Examination patient has a good dorsalis pedis pulse there is no ecchymosis or bruising there is a slight amount of swelling around the right foot and ankle.  She has good dorsiflexion of the ankle the dorsum of the midfoot is not tender to palpation.  Recommended a stiff soled sneaker.  Imaging: XR Foot Complete Right  Result Date: 03/27/2022 3 radiographs of the right foot shows a stable avulsion of the dorsum of the talar head.  No images are attached to the encounter.  Labs: Lab Results  Component Value Date   ESRSEDRATE 2 07/24/2016   CRP 1.5 07/24/2016     Lab Results  Component Value Date   ALBUMIN 4.5 03/26/2022   ALBUMIN 4.8 03/19/2021   ALBUMIN 4.8 03/05/2020    No results found for: "MG" Lab Results  Component Value Date   VD25OH 34.7 03/26/2022   VD25OH 46.7 10/16/2020   VD25OH 38.8 03/01/2019    No results found for: "PREALBUMIN"    Latest Ref Rng & Units 03/26/2022   10:29 AM 03/19/2021   10:38 AM 03/05/2020   10:29 AM  CBC EXTENDED  WBC 3.4 - 10.8 x10E3/uL 6.3  6.7   7.2   RBC 3.77 - 5.28 x10E6/uL 5.12  5.18  5.12   Hemoglobin 11.1 - 15.9 g/dL 15.1  15.5  15.1   HCT 34.0 - 46.6 % 45.5  47.0  46.3   Platelets 150 - 450 x10E3/uL 319  351  304   NEUT# 1.4 - 7.0 x10E3/uL 3.4  3.5  3.8   Lymph# 0.7 - 3.1 x10E3/uL 1.8  2.0  2.1      There is no height or weight on file to calculate BMI.  Orders:  Orders Placed This Encounter  Procedures   XR Foot Complete Right   No orders of the defined types were placed in this encounter.    Procedures: No procedures performed  Clinical Data: No additional findings.  ROS:  All other systems negative, except as noted in the HPI. Review of Systems  Objective: Vital Signs: LMP  (LMP Unknown)   Specialty Comments:  No specialty comments available.  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   Osteoporosis    Scoliosis     Family History  Problem Relation Age of Onset   Arthritis Mother    Hyperlipidemia Mother    Atrial fibrillation Mother    Skin cancer Father    Depression Father    Parkinson's disease Father    CAD Father    Colon cancer Neg Hx    Colon polyps Neg Hx    Esophageal cancer Neg Hx    Stomach cancer Neg Hx    Rectal cancer Neg Hx     Past Surgical History:  Procedure Laterality Date   CESAREAN SECTION  1988   SHOULDER ARTHROSCOPY WITH  SUBACROMIAL DECOMPRESSION AND OPEN ROTATOR C Right 12/09/2013   Procedure: RIGHT SHOULDER ARTHROSCOPY WITH SUBACROMIAL DECOMPRESSION AND MINI OPEN ROTATOR CUFF REPAIR, AND BICEPS TENDODESIS;  Surgeon: Augustin Schooling, MD;  Location: [facility];  Service: Orthopedics;  Laterality: Right;   TONSILLECTOMY     Social History   Occupational History   Occupation: Management/Librarian  Tobacco Use   Smoking status: Never   Smokeless tobacco: Never  Vaping Use   Vaping Use: Never used  Substance and Sexual Activity   Alcohol use: Yes    Comment: occ   Drug use: No   Sexual activity: Yes    Partners: Male    Birth control/protection: Post-menopausal    Comment: 1st intercourse- 23, partner- 1

## 2022-04-08 ENCOUNTER — Encounter: Payer: Self-pay | Admitting: Obstetrics & Gynecology

## 2022-04-08 ENCOUNTER — Ambulatory Visit (INDEPENDENT_AMBULATORY_CARE_PROVIDER_SITE_OTHER): Payer: Medicare Other | Admitting: Obstetrics & Gynecology

## 2022-04-08 VITALS — BP 104/70 | HR 80 | Ht 63.25 in | Wt 144.0 lb

## 2022-04-08 DIAGNOSIS — M810 Age-related osteoporosis without current pathological fracture: Secondary | ICD-10-CM

## 2022-04-08 DIAGNOSIS — B009 Herpesviral infection, unspecified: Secondary | ICD-10-CM | POA: Diagnosis not present

## 2022-04-08 DIAGNOSIS — N952 Postmenopausal atrophic vaginitis: Secondary | ICD-10-CM

## 2022-04-08 DIAGNOSIS — Z9189 Other specified personal risk factors, not elsewhere classified: Secondary | ICD-10-CM | POA: Diagnosis not present

## 2022-04-08 DIAGNOSIS — Z01419 Encounter for gynecological examination (general) (routine) without abnormal findings: Secondary | ICD-10-CM

## 2022-04-08 DIAGNOSIS — Z780 Asymptomatic menopausal state: Secondary | ICD-10-CM

## 2022-04-08 MED ORDER — YUVAFEM 10 MCG VA TABS: ORAL_TABLET | VAGINAL | 4 refills | Status: DC

## 2022-04-08 NOTE — Progress Notes (Signed)
[name] [birth year] [id number]   History:    67 y.o. G2P2L2 Married   RP:  Established patient presenting for annual gyn exam    HPI: Postmenopause, well on no HRT.  No PMB.  No pelvic pain. Occasionally sexually active.  On Vagifem twice a week. Pap 11/2018 Neg. No h/o abnormal Pap.  Will repeat Pap at 5 yrs.  Urine/BMs normal.  Breasts normal. Mammo 07/2021 Neg.  BMI 25.31. Walking 30 min every day. [facility], last BD 08/2020, stable with a T-Score of -2.5.  On med holiday after Fosamax/Boniva x >5 years. Will repeat BD in 08/2022. Recent ankle fracture, healed well. Health labs with Dr [facility]. Colono 07/2017, Cologuard Neg 2023.  Flu vaccine done with Fam MD.  Past medical history,surgical history, family history and social history were all reviewed and documented in the EPIC chart.  Gynecologic History No LMP recorded (lmp unknown). Patient is postmenopausal.  Obstetric History OB History  Gravida Para Term Preterm AB Living  '2 2 2     2  '$ SAB IAB Ectopic Multiple Live Births               # Outcome Date GA Lbr Len/2nd Weight Sex Delivery Anes PTL Lv  2 Term           1 Term             ROS: A ROS was performed and pertinent positives and negatives are included in the history. GENERAL: No fevers or chills. HEENT: No change in vision, no earache, sore throat or sinus congestion. NECK: No pain or stiffness. CARDIOVASCULAR: No chest pain or pressure. No palpitations. PULMONARY: No shortness of breath, cough or wheeze. GASTROINTESTINAL: No abdominal pain, nausea, vomiting or diarrhea, melena or bright red blood per rectum. GENITOURINARY: No urinary frequency, urgency, hesitancy or dysuria. MUSCULOSKELETAL: No joint or muscle pain, no back pain, no recent trauma. DERMATOLOGIC: No rash, no itching, no lesions. ENDOCRINE: No polyuria, polydipsia, no heat or cold intolerance. No recent change in weight. HEMATOLOGICAL: No anemia or easy bruising or bleeding.  NEUROLOGIC: No headache, seizures, numbness, tingling or weakness. PSYCHIATRIC: No depression, no loss of interest in normal activity or change in sleep pattern.     Exam:   BP 104/70   Pulse 80   Ht 5' 3.25" (1.607 m)   Wt 144 lb (65.3 kg)   LMP  (LMP Unknown) Comment: sexually active  SpO2 99%   BMI 25.31 kg/m   Body mass index is 25.31 kg/m.  General appearance : Well developed well nourished female. No acute distress HEENT: Eyes: no retinal hemorrhage or exudates,  Neck supple, trachea midline, no carotid bruits, no thyroidmegaly Lungs: Clear to auscultation, no rhonchi or wheezes, or rib retractions  Heart: Regular rate and rhythm, no murmurs or gallops Breast:Examined in sitting and supine position were symmetrical in appearance, no palpable masses or tenderness,  no skin retraction, no nipple inversion, no nipple discharge, no skin discoloration, no axillary or supraclavicular lymphadenopathy Abdomen: no palpable masses or tenderness, no rebound or guarding Extremities: no edema or skin discoloration or tenderness  Pelvic: Vulva: Normal             Vagina: No gross lesions or discharge  Cervix: No gross lesions or discharge  Uterus  AV, normal size, shape and consistency, non-tender and mobile  Adnexa  Without masses or tenderness  Anus: Normal   Assessment/Plan:  67 y.o. female for annual exam  1. Well female exam with routine gynecological exam Postmenopause, well on no HRT.  No PMB.  No pelvic pain. Occasionally sexually active.  On Vagifem twice a week. Pap 11/2018 Neg. No h/o abnormal Pap.  Will repeat Pap at 5 yrs.  Urine/BMs normal.  Breasts normal. Mammo 07/2021 Neg.  BMI 25.31. Walking 30 min every day. Followed [facility], last BD 08/2020, stable with a T-Score of -2.5.  On med holiday after Fosamax/Boniva x >5 years. Will repeat BD in 08/2022. Recent ankle fracture, healed well. Health labs with Dr [facility]. Colono 07/2017, Cologuard Neg 2023.   Flu vaccine done with Fam MD.  2. Postmenopause Postmenopause, well on no HRT.  No PMB.  No pelvic pain. Occasionally sexually active.   3. Postmenopausal atrophic vaginitis Postmenopause, well on no HRT.  No PMB.  No pelvic pain. Occasionally sexually active. On Vagifem twice a week.  No CI, prescription sent.  4. Osteoporosis without current pathological fracture, unspecified osteoporosis type BMI 25.31. Walking 30 min every day. Followed by [facility], last BD 08/2020, stable with a T-Score of -2.5.  On med holiday after Fosamax/Boniva x >5 years. Will repeat BD in 08/2022. Recent ankle fracture, healed well.   Other orders - Multiple Vitamin (MULTIVITAMIN PO); Take by mouth. Takes 1/2 daily - YUVAFEM 10 MCG TABS vaginal tablet; Intravaginal application twice a week.   Princess Bruins MD, 10:52 AM

## 2022-06-30 DIAGNOSIS — B0052 Herpesviral keratitis: Secondary | ICD-10-CM | POA: Diagnosis not present

## 2022-07-03 DIAGNOSIS — B0052 Herpesviral keratitis: Secondary | ICD-10-CM | POA: Diagnosis not present

## 2022-07-07 DIAGNOSIS — B0052 Herpesviral keratitis: Secondary | ICD-10-CM | POA: Diagnosis not present

## 2022-07-22 DIAGNOSIS — H04123 Dry eye syndrome of bilateral lacrimal glands: Secondary | ICD-10-CM | POA: Diagnosis not present

## 2022-07-22 DIAGNOSIS — B0052 Herpesviral keratitis: Secondary | ICD-10-CM | POA: Diagnosis not present

## 2022-08-07 DIAGNOSIS — Z1231 Encounter for screening mammogram for malignant neoplasm of breast: Secondary | ICD-10-CM | POA: Diagnosis not present

## 2022-08-07 LAB — HM MAMMOGRAPHY

## 2022-08-13 DIAGNOSIS — M722 Plantar fascial fibromatosis: Secondary | ICD-10-CM | POA: Diagnosis not present

## 2022-08-13 DIAGNOSIS — M24572 Contracture, left ankle: Secondary | ICD-10-CM | POA: Diagnosis not present

## 2022-08-13 DIAGNOSIS — M7732 Calcaneal spur, left foot: Secondary | ICD-10-CM | POA: Diagnosis not present

## 2022-08-13 DIAGNOSIS — M24571 Contracture, right ankle: Secondary | ICD-10-CM | POA: Diagnosis not present

## 2022-08-13 DIAGNOSIS — M7731 Calcaneal spur, right foot: Secondary | ICD-10-CM | POA: Diagnosis not present

## 2022-08-19 DIAGNOSIS — L821 Other seborrheic keratosis: Secondary | ICD-10-CM | POA: Diagnosis not present

## 2022-08-19 DIAGNOSIS — L814 Other melanin hyperpigmentation: Secondary | ICD-10-CM | POA: Diagnosis not present

## 2022-08-19 DIAGNOSIS — L578 Other skin changes due to chronic exposure to nonionizing radiation: Secondary | ICD-10-CM | POA: Diagnosis not present

## 2022-08-19 DIAGNOSIS — D225 Melanocytic nevi of trunk: Secondary | ICD-10-CM | POA: Diagnosis not present

## 2022-08-19 DIAGNOSIS — Z86018 Personal history of other benign neoplasm: Secondary | ICD-10-CM | POA: Diagnosis not present

## 2022-08-19 DIAGNOSIS — L820 Inflamed seborrheic keratosis: Secondary | ICD-10-CM | POA: Diagnosis not present

## 2022-09-10 DIAGNOSIS — M7731 Calcaneal spur, right foot: Secondary | ICD-10-CM | POA: Diagnosis not present

## 2022-09-10 DIAGNOSIS — M24571 Contracture, right ankle: Secondary | ICD-10-CM | POA: Diagnosis not present

## 2022-09-10 DIAGNOSIS — M722 Plantar fascial fibromatosis: Secondary | ICD-10-CM | POA: Diagnosis not present

## 2022-09-10 DIAGNOSIS — M7732 Calcaneal spur, left foot: Secondary | ICD-10-CM | POA: Diagnosis not present

## 2022-09-10 DIAGNOSIS — M24572 Contracture, left ankle: Secondary | ICD-10-CM | POA: Diagnosis not present

## 2022-09-24 ENCOUNTER — Ambulatory Visit
Admission: RE | Admit: 2022-09-24 | Discharge: 2022-09-24 | Disposition: A | Discharge status: Home / Self Care | Payer: HMO | Source: Ambulatory Visit | Attending: Family Medicine | Admitting: Family Medicine

## 2022-09-24 DIAGNOSIS — N958 Other specified menopausal and perimenopausal disorders: Secondary | ICD-10-CM | POA: Diagnosis not present

## 2022-09-24 DIAGNOSIS — E349 Endocrine disorder, unspecified: Secondary | ICD-10-CM | POA: Diagnosis not present

## 2022-09-24 DIAGNOSIS — M8588 Other specified disorders of bone density and structure, other site: Secondary | ICD-10-CM | POA: Diagnosis not present

## 2022-10-01 ENCOUNTER — Encounter: Payer: Self-pay | Admitting: Family Medicine

## 2022-10-01 ENCOUNTER — Telehealth: Payer: Self-pay | Admitting: Internal Medicine

## 2022-10-01 ENCOUNTER — Telehealth (INDEPENDENT_AMBULATORY_CARE_PROVIDER_SITE_OTHER): Payer: HMO | Admitting: Family Medicine

## 2022-10-01 DIAGNOSIS — M810 Age-related osteoporosis without current pathological fracture: Secondary | ICD-10-CM | POA: Diagnosis not present

## 2022-10-01 DIAGNOSIS — B0230 Zoster ocular disease, unspecified: Secondary | ICD-10-CM | POA: Diagnosis not present

## 2022-10-01 DIAGNOSIS — M722 Plantar fascial fibromatosis: Secondary | ICD-10-CM

## 2022-10-01 NOTE — Telephone Encounter (Signed)
Per [employer] Need prolia started. Will work on this next week

## 2022-10-01 NOTE — Progress Notes (Signed)
   Subjective:    Patient ID: Bayard Males, female    DOB: 01/15/1956, 67 y.o.   MRN: [id number]  HPI Documentation for virtual audio and video telecommunications through [facility] encounter:  The patient was located at home. 2 patient identifiers used.  The provider was located in the office. The patient did consent to this visit and is aware of possible charges through their insurance for this visit. The other persons participating in this telemedicine service were none. Time spent on call was 5 minutes and in review of previous records >20 minutes total for counseling and coordination of care. This virtual service is not related to other E/M service within previous 7 days.  Discussed bone density scan.  In the past she states that she has had a 5-year course of Fosamax and actually did try some Boniva after that.  2 years ago the score was -2.5 and the most recent 1 was -3.0.  She has real reservations about going on any medication as she has read possible side effects with fractures specifically of the spine.  And the fact that when she is on Prolia, she will need to continue on indefinitely.  Review of Systems     Objective:    Physical Exam Alert and in no distress otherwise not examined       Assessment & Plan:  Osteoporosis without current pathological fracture, unspecified osteoporosis type I discussed the treatment of this and the fact that we cannot use another bisphosphonate as she is already shown no benefit from it.  Discussed risk versus benefit and explained that the benefit is greatly outweighing any risk.  We will start the ball rolling to get her on Prolia. 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

## 2022-10-03 DIAGNOSIS — H401131 Primary open-angle glaucoma, bilateral, mild stage: Secondary | ICD-10-CM | POA: Diagnosis not present

## 2022-10-03 DIAGNOSIS — H5213 Myopia, bilateral: Secondary | ICD-10-CM | POA: Diagnosis not present

## 2022-10-06 NOTE — Telephone Encounter (Signed)
I have faxed over order to [employer] to see if she gets covered

## 2022-10-08 DIAGNOSIS — M722 Plantar fascial fibromatosis: Secondary | ICD-10-CM | POA: Diagnosis not present

## 2022-10-08 DIAGNOSIS — M24571 Contracture, right ankle: Secondary | ICD-10-CM | POA: Diagnosis not present

## 2022-10-08 DIAGNOSIS — M7731 Calcaneal spur, right foot: Secondary | ICD-10-CM | POA: Diagnosis not present

## 2022-10-08 DIAGNOSIS — M24572 Contracture, left ankle: Secondary | ICD-10-CM | POA: Diagnosis not present

## 2022-10-08 DIAGNOSIS — M7732 Calcaneal spur, left foot: Secondary | ICD-10-CM | POA: Diagnosis not present

## 2022-10-10 NOTE — Telephone Encounter (Signed)
Pt is agreeable to paying prolia and will come in on Thursday 10/16/22.  I have ordered from [facility]

## 2022-10-10 NOTE — Telephone Encounter (Signed)
Left message for pt to call me back  No PA is needed. Pt would owe $305 (20% allowable) + $26 (20% admin fee) = $331.  If she is agreeable I can request prolia from [facility] and schedule patient

## 2022-10-16 ENCOUNTER — Other Ambulatory Visit: Payer: HMO

## 2022-11-01 DIAGNOSIS — M1990 Unspecified osteoarthritis, unspecified site: Secondary | ICD-10-CM | POA: Diagnosis not present

## 2022-11-01 DIAGNOSIS — E785 Hyperlipidemia, unspecified: Secondary | ICD-10-CM | POA: Diagnosis not present

## 2022-11-01 DIAGNOSIS — K219 Gastro-esophageal reflux disease without esophagitis: Secondary | ICD-10-CM | POA: Diagnosis not present

## 2022-11-01 DIAGNOSIS — E663 Overweight: Secondary | ICD-10-CM | POA: Diagnosis not present

## 2022-11-01 DIAGNOSIS — N951 Menopausal and female climacteric states: Secondary | ICD-10-CM | POA: Diagnosis not present

## 2022-11-01 DIAGNOSIS — Z7982 Long term (current) use of aspirin: Secondary | ICD-10-CM | POA: Diagnosis not present

## 2022-11-01 DIAGNOSIS — H409 Unspecified glaucoma: Secondary | ICD-10-CM | POA: Diagnosis not present

## 2022-11-01 DIAGNOSIS — M810 Age-related osteoporosis without current pathological fracture: Secondary | ICD-10-CM | POA: Diagnosis not present

## 2022-11-05 DIAGNOSIS — M7732 Calcaneal spur, left foot: Secondary | ICD-10-CM | POA: Diagnosis not present

## 2022-11-05 DIAGNOSIS — M7731 Calcaneal spur, right foot: Secondary | ICD-10-CM | POA: Diagnosis not present

## 2022-11-05 DIAGNOSIS — M24572 Contracture, left ankle: Secondary | ICD-10-CM | POA: Diagnosis not present

## 2022-11-05 DIAGNOSIS — M722 Plantar fascial fibromatosis: Secondary | ICD-10-CM | POA: Diagnosis not present

## 2022-11-18 DIAGNOSIS — M7732 Calcaneal spur, left foot: Secondary | ICD-10-CM | POA: Diagnosis not present

## 2022-11-18 DIAGNOSIS — M722 Plantar fascial fibromatosis: Secondary | ICD-10-CM | POA: Diagnosis not present

## 2022-11-18 DIAGNOSIS — M24572 Contracture, left ankle: Secondary | ICD-10-CM | POA: Diagnosis not present

## 2022-11-18 DIAGNOSIS — M7731 Calcaneal spur, right foot: Secondary | ICD-10-CM | POA: Diagnosis not present

## 2022-12-10 ENCOUNTER — Other Ambulatory Visit: Payer: HMO

## 2022-12-11 ENCOUNTER — Other Ambulatory Visit (INDEPENDENT_AMBULATORY_CARE_PROVIDER_SITE_OTHER): Payer: HMO

## 2022-12-11 DIAGNOSIS — M810 Age-related osteoporosis without current pathological fracture: Secondary | ICD-10-CM | POA: Diagnosis not present

## 2022-12-11 MED ORDER — DENOSUMAB 60 MG/ML ~~LOC~~ SOSY
60.0000 mg | PREFILLED_SYRINGE | Freq: Once | SUBCUTANEOUS | Status: AC
  Administered 2022-12-11: 60 mg via SUBCUTANEOUS

## 2022-12-12 ENCOUNTER — Telehealth (INDEPENDENT_AMBULATORY_CARE_PROVIDER_SITE_OTHER): Payer: HMO | Admitting: Medical

## 2022-12-12 VITALS — HR 107 | Temp 99.0°F | Wt 141.0 lb

## 2022-12-12 DIAGNOSIS — U071 COVID-19: Secondary | ICD-10-CM | POA: Diagnosis not present

## 2022-12-12 DIAGNOSIS — R051 Acute cough: Secondary | ICD-10-CM | POA: Diagnosis not present

## 2022-12-12 MED ORDER — PAXLOVID (300/100) 20 X 150 MG & 10 X 100MG PO TBPK: 3.0000 | ORAL_TABLET | Freq: Two times a day (BID) | ORAL | 0 refills | Status: AC

## 2022-12-12 MED ORDER — BENZONATATE 200 MG PO CAPS: 200.0000 mg | ORAL_CAPSULE | Freq: Three times a day (TID) | ORAL | 0 refills | Status: DC | PRN

## 2022-12-12 NOTE — Progress Notes (Signed)
Subjective:     Patient ID: [name], female   DOB: 1955/05/25, 67 y.o.   MRN: [id number]  This visit type was conducted due to national recommendations for restrictions regarding the COVID-19 Pandemic (e.g. social distancing) in an effort to limit this patient's exposure and mitigate transmission in our community.  Due to their co-morbid illnesses, this patient is at least at moderate risk for complications without adequate follow up.  This format is felt to be most appropriate for this patient at this time.    Documentation for virtual audio and video telecommunications through [facility] encounter:  The patient was located at home. The provider was located in the office. The patient did consent to this visit and is aware of possible charges through their insurance for this visit.  The other persons participating in this telemedicine service were none. Time spent on call was 20 minutes and in review of previous records 20 minutes total.  This virtual service is not related to other E/M service within previous 7 days.   HPI Chief Complaint  Patient presents with   Covid Positive    Symptoms started last night- achy, cough. Scratchy throat- Tuesday. Positive covid this morning. Had Prolia shot yesterday in office   Virtual for illness.  Started with scratchy throat Tuesday and Wednesday 3 days ago.  Last night had a bad night of aches and coughing a lot. Felt cold last night but no fever.   Fatigued.   Has headache.  No NVD.   No SOB or wheezing.  No ear pain.    Did Covid test this morning and it was positive.  Had covid once before, was like a severe flu, fatigue, but no breathing issues.    Currently using aspirin, and used delsym last night.  Used some zyrtec for runny nose.  No other aggravating or relieving factors. No other complaint.   Past Medical History:  Diagnosis Date   Anxiety    very high   GERD (gastroesophageal reflux disease)    Glaucoma    Heart murmur     Pt saw Cardiologist when she was in her 51s, not since.   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   Osteoporosis    Scoliosis    Current Outpatient Medications on File Prior to Visit  Medication Sig Dispense Refill   aspirin EC 81 MG tablet Take 81 mg by mouth daily.     Cholecalciferol (VITAMIN D) 2000 UNITS CAPS Take 2,000 Units by mouth daily.     cycloSPORINE (RESTASIS) 0.05 % ophthalmic emulsion Place 1 drop into both eyes 2 (two) times daily.     famotidine (PEPCID) 20 MG tablet Take 10 mg by mouth daily.      latanoprost (XALATAN) 0.005 % ophthalmic solution Place 1 drop into both eyes at bedtime.     Multiple Vitamin (MULTIVITAMIN PO) Take by mouth. Takes 1/2 daily     Omega-3 Fatty Acids (FISH OIL PO) Take 1 capsule by mouth daily.     vitamin C (ASCORBIC ACID) 500 MG tablet Take 500 mg by mouth daily.     YUVAFEM 10 MCG TABS vaginal tablet Intravaginal application twice a week. 24 tablet 4   Acetylcarnitine HCl (ACETYL L-CARNITINE PO) Take by mouth daily.     No current facility-administered medications on file prior to visit.    Review of Systems As in subjective  Objective:   Physical Exam Due to coronavirus pandemic stay at home measures, patient visit was virtual and they were not examined in person.   Pulse (!) 107   Temp 99 F (37.2 C)   Wt 141 lb (64 kg)   LMP  (LMP Unknown) Comment: sexually active  SpO2 97%   BMI 24.78 kg/m   Gen: wd, wn, nad, somewhat ill appearing No labored breathing or wheezing      Assessment:     Encounter Diagnoses  Name Primary?   COVID Yes   Acute cough         Plan:     Covid infection, covid illness general recommendations:  I recommend you rest, hydrate well with water and clear fluids throughout the day.  If you feel dry in the mouth, tongue or feel that you are urinating as much as usual, then increase hydration.  You urine should be like yellow  to clear, not dark yellow or darker.     Pain, body aches, or fever: You can use Tylenol /Acetaminophen 325mg  over the counter for pain or fever, every 4-6 hours OR You can use Ibuprofen 200mg  over the counter, 3 tablets either 2 or 3 times daily for pain or fever.     Cough: You can use Tessalon/Benzonatate prescription cough drops up to 3 times daily for cough.   Drainage and congestion: You can use over the counter antihistamine such as zyrtec, allegra, or benadryl as directed on the label Or, you could use Daqyuil or other OTC multi symptom or "flu" formula.   Nausea: You can use over the counter Emetrol for nausea.     Antiviral medication: Begin medication Paxlovid to help reduce your risk of hospitalization or severe illness.  If medicaiton is not available, too expensive or not covered by insurance, then call us back.   Other supportive measures: I recommend extra vitamins to help your body fight the illness.   Consider over the counter vitamin pack such as EmergenC Immune Plus which contains extra vitamin C, vitamin D and zinc.     In the next few days, if you are having trouble breathing, if you are very weak, have high fever 103 or higher consistently despite Tylenol, or uncontrollable nausea and vomiting, then call or go to the emergency department.    If you have other questions or have other symptoms or questions you are concerned about then please make a virtual visit   Covid symptoms such as fatigue and cough can linger over 2 weeks, even after the initial fever, aches, chills, and other initial symptoms.   Self Quarantine: The CDC, Centers for Disease Control has recommended a self quarantine of 5 days from the start of your illness until you are symptom-free including at least 24 hours of no symptoms including no fever, no shortness of breath, and no body aches and chills, by day 5 before returning to work or general contact with the public.  What does self  quarantine mean: avoiding contact with people as much as possible.   Particularly in your house, isolate your self from others in a separate room, wear a mask when possible in the room, particularly if coughing a lot.   Have others bring food, water, medications, etc., to your door, but avoid direct contact with your household contacts during this time to avoid spreading the infection to them.   If you have a separate bathroom and living quarters during the next 2 weeks away from others,  that would be preferable.    If you can't completely isolate, then wear a mask, wash hands frequently with soap and water for at least 15 seconds, minimize close contact with others, and have a friend or family member check regularly from a distance to make sure you are not getting seriously worse.     You should not be going out in public, should not be going to stores, to work or other public places until all your symptoms have resolved and at least 5 days + 24 hours of no symptoms at all have transpired.   Ideally you should avoid contact with others for a full 5 days if possible.  One of the goals is to limit spread to high risk people; people that are older and elderly, people with multiple health issues like diabetes, heart disease, lung disease, and anybody that has weakened immune systems such as people with cancer or on immunosuppressive therapy.      [name] "[name]" was seen today for covid positive.  Diagnoses and all orders for this visit:  COVID  Acute cough  Other orders -     benzonatate (TESSALON) 200 MG capsule; Take 1 capsule (200 mg total) by mouth 3 (three) times daily as needed for cough. -     nirmatrelvir/ritonavir (PAXLOVID, 300/100,) 20 x 150 MG & 10 x 100MG  TBPK; Take 3 tablets by mouth 2 (two) times daily for 5 days.  F/ u prn

## 2023-01-26 DIAGNOSIS — H04123 Dry eye syndrome of bilateral lacrimal glands: Secondary | ICD-10-CM | POA: Diagnosis not present

## 2023-01-26 DIAGNOSIS — T1512XA Foreign body in conjunctival sac, left eye, initial encounter: Secondary | ICD-10-CM | POA: Diagnosis not present

## 2023-02-11 ENCOUNTER — Other Ambulatory Visit: Payer: Self-pay

## 2023-02-11 ENCOUNTER — Emergency Department (HOSPITAL_BASED_OUTPATIENT_CLINIC_OR_DEPARTMENT_OTHER)
Admission: EM | Admit: 2023-02-11 | Discharge: 2023-02-11 | Disposition: A | Discharge status: Home / Self Care | Payer: HMO | Attending: Emergency Medicine | Admitting: Emergency Medicine

## 2023-02-11 ENCOUNTER — Emergency Department (HOSPITAL_BASED_OUTPATIENT_CLINIC_OR_DEPARTMENT_OTHER): Payer: HMO | Admitting: Radiology

## 2023-02-11 DIAGNOSIS — S50911A Unspecified superficial injury of right forearm, initial encounter: Secondary | ICD-10-CM | POA: Insufficient documentation

## 2023-02-11 DIAGNOSIS — W108XXA Fall (on) (from) other stairs and steps, initial encounter: Secondary | ICD-10-CM | POA: Diagnosis not present

## 2023-02-11 DIAGNOSIS — S59911A Unspecified injury of right forearm, initial encounter: Secondary | ICD-10-CM

## 2023-02-11 DIAGNOSIS — R0789 Other chest pain: Secondary | ICD-10-CM | POA: Diagnosis not present

## 2023-02-11 DIAGNOSIS — S20309A Unspecified superficial injuries of unspecified front wall of thorax, initial encounter: Secondary | ICD-10-CM | POA: Diagnosis not present

## 2023-02-11 DIAGNOSIS — M8580 Other specified disorders of bone density and structure, unspecified site: Secondary | ICD-10-CM | POA: Diagnosis not present

## 2023-02-11 DIAGNOSIS — R079 Chest pain, unspecified: Secondary | ICD-10-CM | POA: Diagnosis not present

## 2023-02-11 DIAGNOSIS — S59201A Unspecified physeal fracture of lower end of radius, right arm, initial encounter for closed fracture: Secondary | ICD-10-CM | POA: Diagnosis not present

## 2023-02-11 DIAGNOSIS — S52591A Other fractures of lower end of right radius, initial encounter for closed fracture: Secondary | ICD-10-CM | POA: Diagnosis not present

## 2023-02-11 DIAGNOSIS — S6991XA Unspecified injury of right wrist, hand and finger(s), initial encounter: Secondary | ICD-10-CM | POA: Diagnosis not present

## 2023-02-11 NOTE — ED Triage Notes (Signed)
Pt POV from home after tripping on stairs, fell forward, reporting sternal chest pain, and R wrist pain.

## 2023-02-11 NOTE — ED Provider Notes (Signed)
[place] EMERGENCY DEPARTMENT AT [facility] Provider Note   CSN: [id number] Arrival date & time: 02/11/23  1521     History Chief Complaint  Patient presents with   Fall   Chest Injury   Wrist Pain    HPI [name] is a 67 y.o. female presenting for fall on the stairs. Missed the 3rd to last step and FOOSH.   Patient's recorded medical, surgical, social, medication list and allergies were reviewed in the Snapshot window as part of the initial history.   Review of Systems   Review of Systems  Constitutional:  Negative for chills and fever.  HENT:  Negative for ear pain and sore throat.   Eyes:  Negative for pain and visual disturbance.  Respiratory:  Negative for cough and shortness of breath.   Cardiovascular:  Negative for chest pain and palpitations.  Gastrointestinal:  Negative for abdominal pain and vomiting.  Genitourinary:  Negative for dysuria and hematuria.  Musculoskeletal:  Negative for arthralgias and back pain.  Skin:  Negative for color change and rash.  Neurological:  Negative for seizures and syncope.  All other systems reviewed and are negative.   Physical Exam Updated Vital Signs BP 103/78 (BP Location: Right Arm)   Pulse 81   Temp 97.8 F (36.6 C)   Resp 17   Ht 5\' 3"  (1.6 m)   Wt 64.9 kg   LMP  (LMP Unknown) Comment: sexually active  SpO2 98%   BMI 25.33 kg/m  Physical Exam Constitutional:      General: She is not in acute distress.    Appearance: She is not ill-appearing or toxic-appearing.  HENT:     Head: Normocephalic and atraumatic.  Eyes:     Extraocular Movements: Extraocular movements intact.     Pupils: Pupils are equal, round, and reactive to light.  Cardiovascular:     Rate and Rhythm: Normal rate.  Pulmonary:     Effort: No respiratory distress.  Abdominal:     General: Abdomen is flat.  Musculoskeletal:        General: Signs of injury present. No swelling or deformity.     Cervical back: Normal range  of motion. No rigidity.  Skin:    General: Skin is warm and dry.  Neurological:     General: No focal deficit present.     Mental Status: She is alert and oriented to person, place, and time.  Psychiatric:        Mood and Affect: Mood normal.      ED Course/ Medical Decision Making/ A&P    Procedures Procedures   Medications Ordered in ED Medications - No data to display Medical Decision Making:    [name] is a 67 y.o. female who presented to the ED today with a moderate mechanisma trauma, detailed above.    Patient placed on continuous vitals and telemetry monitoring while in ED which was reviewed periodically.   Reviewed and confirmed nursing documentation for past medical history, family history, social history.    Initial Assessment/Plan:   This is a patient presenting with a moderate mechanism trauma.  As such, I have considered intracranial injuries including intracranial hemorrhage, intrathoracic injuries including blunt myocardial or blunt lung injury, blunt abdominal injuries including aortic dissection, bladder injury, spleen injury, liver injury and I have considered orthopedic injuries including extremity or spinal injury. With the patient's presentation of moderate mechanism trauma but an otherwise reassuring exam, patient warrants targeted evaluation for potential traumatic injuries.  Will proceed with targeted evaluation for potential injuries. Will proceed with XR chest and right wrist as well as EKG for blunt cardiac.   Final Reassessment and Plan:   Radial styloid fracture.  Will place in removable wrist brace Ambulatory tolerating p.o. intake on reassessment no acute distress feels comfortable outpatient care and management.  Will refer to orthopedics for reassessment.   Disposition:  I have considered need for hospitalization, however, considering all of the above, I believe this patient is stable for discharge at this time.  Patient/family educated  about specific return precautions for given chief complaint and symptoms.  Patient/family educated about follow-up with PCP.     Patient/family expressed understanding of return precautions and need for follow-up. Patient spoken to regarding all imaging and laboratory results and appropriate follow up for these results. All education provided in verbal form with additional information in written form. Time was allowed for answering of patient questions. Patient discharged.    Emergency Department Medication Summary:   Medications - No data to display        Clinical Impression:  1. Injury of right radius      Data Unavailable   Final Clinical Impression(s) / ED Diagnoses Final diagnoses:  Injury of right radius    Rx / DC Orders ED Discharge Orders     None         Glyn Ade, MD 02/11/23 [phone]

## 2023-02-12 ENCOUNTER — Telehealth: Payer: Self-pay | Admitting: Orthopedic Surgery

## 2023-02-12 NOTE — Telephone Encounter (Signed)
Pt was see at [facility] ED and Dr Fara Boros was on call dr. Pt need an appt within 10 days. Please call pt at [phone]

## 2023-02-16 ENCOUNTER — Emergency Department (HOSPITAL_COMMUNITY): Payer: HMO

## 2023-02-16 ENCOUNTER — Encounter (HOSPITAL_COMMUNITY): Payer: Self-pay

## 2023-02-16 ENCOUNTER — Other Ambulatory Visit: Payer: Self-pay

## 2023-02-16 ENCOUNTER — Emergency Department (HOSPITAL_COMMUNITY)
Admission: EM | Admit: 2023-02-16 | Discharge: 2023-02-17 | Disposition: A | Discharge status: Home / Self Care | Payer: HMO | Attending: Emergency Medicine | Admitting: Emergency Medicine

## 2023-02-16 DIAGNOSIS — W19XXXD Unspecified fall, subsequent encounter: Secondary | ICD-10-CM | POA: Insufficient documentation

## 2023-02-16 DIAGNOSIS — S2222XD Fracture of body of sternum, subsequent encounter for fracture with routine healing: Secondary | ICD-10-CM | POA: Diagnosis not present

## 2023-02-16 DIAGNOSIS — S2220XD Unspecified fracture of sternum, subsequent encounter for fracture with routine healing: Secondary | ICD-10-CM | POA: Diagnosis not present

## 2023-02-16 DIAGNOSIS — R7309 Other abnormal glucose: Secondary | ICD-10-CM | POA: Insufficient documentation

## 2023-02-16 DIAGNOSIS — R0789 Other chest pain: Secondary | ICD-10-CM | POA: Diagnosis not present

## 2023-02-16 DIAGNOSIS — Z7982 Long term (current) use of aspirin: Secondary | ICD-10-CM | POA: Diagnosis not present

## 2023-02-16 DIAGNOSIS — J9811 Atelectasis: Secondary | ICD-10-CM | POA: Diagnosis not present

## 2023-02-16 DIAGNOSIS — S299XXD Unspecified injury of thorax, subsequent encounter: Secondary | ICD-10-CM | POA: Diagnosis present

## 2023-02-16 DIAGNOSIS — R739 Hyperglycemia, unspecified: Secondary | ICD-10-CM

## 2023-02-16 NOTE — ED Triage Notes (Signed)
Pt states that she fell last week, falling onto her chest. Pt has bruising and a broken right arm. Pt states that her chest pains feels muscular.

## 2023-02-17 ENCOUNTER — Encounter (HOSPITAL_COMMUNITY): Payer: Self-pay | Admitting: Radiology

## 2023-02-17 ENCOUNTER — Other Ambulatory Visit: Payer: Self-pay

## 2023-02-17 ENCOUNTER — Emergency Department (HOSPITAL_COMMUNITY): Payer: HMO

## 2023-02-17 DIAGNOSIS — J9811 Atelectasis: Secondary | ICD-10-CM | POA: Diagnosis not present

## 2023-02-17 LAB — CBC
HCT: 45.7 % (ref 36.0–46.0)
Hemoglobin: 15.5 g/dL — ABNORMAL HIGH (ref 12.0–15.0)
MCH: 31.6 pg (ref 26.0–34.0)
MCHC: 33.9 g/dL (ref 30.0–36.0)
MCV: 93.1 fL (ref 80.0–100.0)
Platelets: 305 10*3/uL (ref 150–400)
RBC: 4.91 MIL/uL (ref 3.87–5.11)
RDW: 12.6 % (ref 11.5–15.5)
WBC: 9.4 10*3/uL (ref 4.0–10.5)
nRBC: 0 % (ref 0.0–0.2)

## 2023-02-17 LAB — TROPONIN I (HIGH SENSITIVITY): Troponin I (High Sensitivity): 2 ng/L (ref <18)

## 2023-02-17 LAB — BASIC METABOLIC PANEL
Anion gap: 9 (ref 5–15)
BUN: 18 mg/dL (ref 8–23)
CO2: 24 mmol/L (ref 22–32)
Calcium: 8.8 mg/dL — ABNORMAL LOW (ref 8.9–10.3)
Chloride: 103 mmol/L (ref 98–111)
Creatinine, Ser: 0.55 mg/dL (ref 0.44–1.00)
GFR, Estimated: >60 mL/min (ref >60)
Glucose, Bld: 119 mg/dL — ABNORMAL HIGH (ref 70–99)
Potassium: 3.9 mmol/L (ref 3.5–5.1)
Sodium: 136 mmol/L (ref 135–145)

## 2023-02-17 MED ORDER — OXYCODONE HCL 5 MG PO TABS: 5.0000 mg | ORAL_TABLET | ORAL | 0 refills | Status: DC | PRN

## 2023-02-17 MED ORDER — OXYCODONE-ACETAMINOPHEN 5-325 MG PO TABS
1.0000 | ORAL_TABLET | Freq: Once | ORAL | Status: AC
  Administered 2023-02-17: 1 via ORAL
  Filled 2023-02-17: qty 1

## 2023-02-17 NOTE — ED Provider Notes (Signed)
[place] EMERGENCY DEPARTMENT AT [facility] Provider Note   CSN: [id number] Arrival date & time: 02/16/23  2327     History  Chief Complaint  Patient presents with   Chest Pain    [name] is a 67 y.o. female.  The history is provided by the patient.  Chest Pain She has history of hyperlipidemia and was seen in the emergency department on 02/11/2019 for following a fall with right wrist fracture and complains of increased pain in her mid chest.  She states that her wrist is doing well with a wrist brace in place.  She has noted some bruising of the left breast.  However, the pain is incapacitating her, she is unable to get up out of bed.  Acetaminophen has not been giving her sufficient pain relief.   Home Medications Prior to Admission medications   Medication Sig Start Date End Date Taking? Authorizing Provider  Acetylcarnitine HCl (ACETYL L-CARNITINE PO) Take by mouth daily.    [provider]  aspirin EC 81 MG tablet Take 81 mg by mouth daily.    [provider]  benzonatate (TESSALON) 200 MG capsule Take 1 capsule (200 mg total) by mouth 3 (three) times daily as needed for cough. 12/12/22   Tysinger, Kermit Balo, PA-C  Cholecalciferol (VITAMIN D) 2000 UNITS CAPS Take 2,000 Units by mouth daily.    [provider]  cycloSPORINE (RESTASIS) 0.05 % ophthalmic emulsion Place 1 drop into both eyes 2 (two) times daily.    [provider]  famotidine (PEPCID) 20 MG tablet Take 10 mg by mouth daily.     [provider]  latanoprost (XALATAN) 0.005 % ophthalmic solution Place 1 drop into both eyes at bedtime.    [provider]  Multiple Vitamin (MULTIVITAMIN PO) Take by mouth. Takes 1/2 daily    [provider]  Omega-3 Fatty Acids (FISH OIL PO) Take 1 capsule by mouth daily.    [provider]  vitamin C (ASCORBIC ACID) 500 MG tablet Take 500 mg by mouth daily.    [provider]  YUVAFEM 10  MCG TABS vaginal tablet Intravaginal application twice a week. 04/08/22   Genia Del, MD      Allergies    Patient has no known allergies.    Review of Systems   Review of Systems  Cardiovascular:  Positive for chest pain.  All other systems reviewed and are negative.   Physical Exam Updated Vital Signs BP 136/80 (BP Location: Right Arm)   Pulse 78   Temp 97.9 F (36.6 C) (Oral)   Resp 18   LMP  (LMP Unknown) Comment: sexually active  SpO2 98%  Physical Exam Vitals and nursing note reviewed.   67 year old female, resting comfortably and in no acute distress. Vital signs are normal. Oxygen saturation is 98%, which is normal. Head is normocephalic and atraumatic. PERRLA, EOMI.  Lungs are clear without rales, wheezes, or rhonchi. Chest: Active but in the superior aspect of the left breast.  There is point tenderness over the mid sternum without crepitus. Heart has regular rate and rhythm without murmur. Abdomen is soft, flat, nontender. Neurologic: Mental status is normal, moves all extremities equally.  ED Results / Procedures / Treatments   Labs (all labs ordered are listed, but only abnormal results are displayed) Labs Reviewed  BASIC METABOLIC PANEL - Abnormal; Notable for the following components:      Result Value   Glucose, Bld 119 (*)  Calcium 8.8 (*)    All other components within normal limits  CBC - Abnormal; Notable for the following components:   Hemoglobin 15.5 (*)    All other components within normal limits  TROPONIN I (HIGH SENSITIVITY)  TROPONIN I (HIGH SENSITIVITY)    EKG EKG Interpretation Date/Time:  Tuesday February 17 2023 00:07:37 EST Ventricular Rate:  78 PR Interval:  140 QRS Duration:  103 QT Interval:  363 QTC Calculation: 414 R Axis:   41  Text Interpretation: Sinus rhythm Probable left atrial enlargement Consider anterior infarct When compared with ECG of 02/11/2023, No significant change was found Confirmed by Dione Booze  ([id number]) on 02/17/2023 4:10:52 AM  Radiology CT Chest Wo Contrast  Result Date: 02/17/2023 CLINICAL DATA:  Fall last week with chest pain.  Blunt chest trauma. EXAM: CT CHEST WITHOUT CONTRAST TECHNIQUE: Multidetector CT imaging of the chest was performed following the standard protocol without IV contrast. RADIATION DOSE REDUCTION: This exam was performed according to the departmental dose-optimization program which includes automated exposure control, adjustment of the mA and/or kV according to patient size and/or use of iterative reconstruction technique. COMPARISON:  Abdominal CT 05/03/2007 FINDINGS: Cardiovascular: No significant vascular findings. Normal heart size. No pericardial effusion. Mediastinum/Nodes: No pneumomediastinum or hematoma Lungs/Pleura: Low volume chest with bands of opacity in the lower lungs, an atelectatic appearance. No hemothorax, pneumothorax, or pulmonary contusion. No definitive infection. Upper Abdomen: Soft tissue density lesion in the right lobe liver measuring 2.2 cm. The lesion was seen in the same location measuring 1.1 cm in 2009 with features of hemangioma. Musculoskeletal: Left third rib fracture with chronic/healed appearance especially based on sagittal reformats. Chronic T12 compression fracture with moderate height loss. There is thoracic scoliosis. No acute fracture detected. IMPRESSION: 1. No acute fracture or evidence of intrathoracic injury. 2. Atelectasis at the lung bases. 3. Remote T12 and left third rib fractures. Electronically Signed   By: Tiburcio Pea M.D.   On: 02/17/2023 06:01    Procedures Procedures  Cardiac monitor shows normal sinus rhythm, per my interpretation.  Medications Ordered in ED Medications  oxyCODONE-acetaminophen (PERCOCET/ROXICET) 5-325 MG per tablet 1 tablet (has no administration in time range)    ED Course/ Medical Decision Making/ A&P                                 Medical Decision Making Amount and/or Complexity  of Data Reviewed Labs: ordered. Radiology: ordered.  Risk Prescription drug management.   Recent fall with chest trauma, exam concerning for sternal fracture.  I have reviewed her past records and do note ED visit on 02/11/2023 for fall and chest x-ray at that time was read as normal.  I have reviewed the images, and I do not see any definite sternal fracture.  I have ordered CT scan of the chest.  I have ordered a dose of oxycodone-acetaminophen for pain.  She did have electrocardiogram ordered at triage and I reviewed that and my interpretation is no acute changes and unchanged from prior.  I have reviewed her laboratory tests, and my interpretation is normal troponin normal CBC, mildly elevated random glucose level which will need to be followed as an outpatient.  CT report states no acute fracture.  However, on my independent viewing of the images, I note some irregularity of the anterior aspect of the sternum which I feel is consistent with a fracture.  This does correlate with her area  of point tenderness.  I am discharging her with instructions to apply ice, use over-the-counter NSAIDs and acetaminophen as needed for pain.  I am also giving her a prescription for oxycodone to use as needed for additional pain relief.  She is to follow-up with orthopedics as previously had been arranged.  Final Clinical Impression(s) / ED Diagnoses Final diagnoses:  Closed fracture of body of sternum with routine healing, subsequent encounter  Elevated random blood glucose level    Rx / DC Orders ED Discharge Orders          Ordered    oxyCODONE (ROXICODONE) 5 MG immediate release tablet  Every 4 hours PRN        02/17/23 0611              Dione Booze, MD 02/17/23 [phone]

## 2023-02-17 NOTE — Discharge Instructions (Addendum)
Apply ice for 30 minutes at a time, 4 times a day.  If necessary, it is okay to apply ice more often than that.  Take 2 naproxen tablets at a time, twice a day.  To get additional pain relief, add acetaminophen.  When you combine acetaminophen with naproxen, you will get better pain relief than you get from taking either medication by itself.  If you still need additional pain relief, you may add oxycodone.  Oxycodone works on pain in a completely different way and its effects added to the effects of both naproxen and acetaminophen.  Because oxycodone can make you drowsy, it may be a good choice to take at bedtime.

## 2023-02-20 ENCOUNTER — Ambulatory Visit (INDEPENDENT_AMBULATORY_CARE_PROVIDER_SITE_OTHER): Payer: HMO | Admitting: Orthopedic Surgery

## 2023-02-20 ENCOUNTER — Other Ambulatory Visit (INDEPENDENT_AMBULATORY_CARE_PROVIDER_SITE_OTHER): Payer: Self-pay

## 2023-02-20 DIAGNOSIS — S62101D Fracture of unspecified carpal bone, right wrist, subsequent encounter for fracture with routine healing: Secondary | ICD-10-CM

## 2023-02-20 NOTE — Progress Notes (Unsigned)
[name] - 67 y.o. female MRN [id number]  Date of birth: 1955-11-07  Office Visit Note: Visit Date: 02/20/2023 PCP: Ronnald Nian, MD Referred by: Ronnald Nian, MD  Subjective: No chief complaint on file.  HPI: [name] is a pleasant 67 y.o. female who presents today for evaluation of her right wrist injury sustained 1 week prior.  Injury mechanism described as a fall downstairs, significant trauma to the sternal region as well as the right wrist.  She was seen in the emergency department setting the day of her injury, underwent clinical and radiographic workup of the right upper extremity which showed a nondisplaced distal radius fracture.  She has been placed into a removable splint, which is currently comfortable.  Denies any numbness or tingling in the right hand, no associated pain at the forearm or elbow region.  Pertinent ROS were reviewed with the patient and found to be negative unless otherwise specified above in HPI.   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 Closed fracture of right wrist with routine healing, subsequent encounter     Plan: Extensive discussion was had the patient today regarding her right wrist injury.  Repeat x-rays today once again demonstrate the subtle nondisplaced metaphyseal distal radius fracture.  This is correlated clinically with tenderness in this region.  Given that the wrist brace is well-fitting, we can continue with this method of immobilization for an additional 4 weeks to allow for appropriate bony healing.  At that juncture, she can begin range of motion exercises of the wrist and hand, remain nonweightbearing.  I will plan on  seeing her back at that juncture for repeat clinical and radiographic check.  Patient demonstrated understanding.  Follow-up: No follow-ups on file.   Meds & Orders: No orders of the defined types were placed in this encounter.   Orders Placed This Encounter  Procedures   XR Wrist Complete Right     Procedures: No procedures performed      Clinical History: No specialty comments available.  She reports that she has never smoked. She has never used smokeless tobacco. No results for input(s): "HGBA1C", "LABURIC" in the last 8760 hours.  Objective:   Vital Signs: LMP  (LMP Unknown) Comment: sexually active  Physical Exam  Gen: Well-appearing, in no acute distress; non-toxic CV: Regular Rate. Well-perfused. Warm.  Resp: Breathing unlabored on room air; no wheezing. Psych: Fluid speech in conversation; appropriate affect; normal thought process  Ortho Exam Right wrist: - Moderate tenderness over the metaphyseal region of the distal radius - Skin is intact, mild swelling over the wrist, minimal ecchymosis - Digital range of motion is well-preserved, composite fist without restriction, sensation is well-preserved in all distributions of the hand - Gentle range of motion of the wrist performed today with moderate pain, flexion 25 degrees, extension 15 degrees  Imaging: X-rays of the right wrist were completed today X-rays once again demonstrate subtle nondisplaced metaphyseal distal radius fracture without evidence of interval displacement in comparison to prior films.  Past Medical/Family/Surgical/Social History: Medications & Allergies reviewed per EMR, new medications updated. Patient Active Problem List   Diagnosis Date Noted   Hyperlipidemia 03/25/2022   Herpes labialis 02/16/2021   Gastroesophageal reflux disease without esophagitis 02/15/2018  Lactose intolerance 02/15/2018   History of vitamin D deficiency 02/15/2018   Osteoporosis 02/15/2018   LIVER HEMANGIOMA  05/26/2007   Spondylosis 05/26/2007   Disorder of gallbladder 03/11/2007   Past Medical History:  Diagnosis Date   Anxiety    very high   GERD (gastroesophageal reflux disease)    Glaucoma    Heart murmur    Pt saw Cardiologist when she was in her 30s, not since.   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   Osteoporosis    Scoliosis    Family History  Problem Relation Age of Onset   Arthritis Mother    Hyperlipidemia Mother    Atrial fibrillation Mother    Skin cancer Father    Depression Father    Parkinson's disease Father    CAD Father    Colon cancer Neg Hx    Colon polyps Neg Hx    Esophageal cancer Neg Hx    Stomach cancer Neg Hx    Rectal cancer Neg Hx    Past Surgical History:  Procedure Laterality Date   CESAREAN SECTION  1988   SHOULDER ARTHROSCOPY WITH SUBACROMIAL DECOMPRESSION AND OPEN ROTATOR C Right 12/09/2013   Procedure: RIGHT SHOULDER ARTHROSCOPY WITH SUBACROMIAL DECOMPRESSION AND MINI OPEN ROTATOR CUFF REPAIR, AND BICEPS TENDODESIS;  Surgeon: Verlee Rossetti, MD;  Location: [facility] OR;  Service: Orthopedics;  Laterality: Right;   TONSILLECTOMY     Social History   Occupational History   Occupation: Management/Librarian  Tobacco Use   Smoking status: Never   Smokeless tobacco: Never  Vaping Use   Vaping status: Never Used  Substance and Sexual Activity   Alcohol use: Yes    Comment: occ   Drug use: No   Sexual activity: Yes    Partners: Male    Birth control/protection: Post-menopausal    Comment: 1st intercourse- 23, partner- 1    Jessi Jessop (Fara Boros) Denese Killings, M.D. [facility] 10:27 AM

## 2023-02-26 ENCOUNTER — Ambulatory Visit: Payer: HMO | Admitting: Family Medicine

## 2023-02-26 VITALS — BP 122/74 | HR 88 | Wt 146.4 lb

## 2023-02-26 DIAGNOSIS — S2222XA Fracture of body of sternum, initial encounter for closed fracture: Secondary | ICD-10-CM | POA: Diagnosis not present

## 2023-02-26 DIAGNOSIS — S62101A Fracture of unspecified carpal bone, right wrist, initial encounter for closed fracture: Secondary | ICD-10-CM | POA: Diagnosis not present

## 2023-02-26 MED ORDER — OXYCODONE HCL 5 MG PO TABS: 5.0000 mg | ORAL_TABLET | ORAL | 0 refills | Status: DC | PRN

## 2023-02-26 NOTE — Progress Notes (Signed)
   Subjective:    Patient ID: [name], female    DOB: [birth year], 67 y.o.   MRN: [id number]  HPI 1 December for she fell and sustained a fracture to the right wrist.  She was also having some chest discomfort and did go back for further evaluation of that on 1210.  CT scan did show evidence of a sternal fracture.  She has been seen by orthopedics for the wrist.  She is been instructed to take Tylenol then ibuprofen as needed and then the oxycodone if she needed more medication. She is concerned about getting addicted to the medications.  Review of Systems     Objective:    Physical Exam Alert and in no distress otherwise not examined The x-rays were reviewed with her.       Assessment & Plan:  Fracture of body of sternum, initial encounter for closed fracture - Plan: oxyCODONE (ROXICODONE) 5 MG immediate release tablet  Closed fracture of right wrist, initial encounter - Plan: oxyCODONE (ROXICODONE) 5 MG immediate release tablet She will continue be followed by orthopedics for the wrist.  Discussed the sternal fracture with her and again recommended Tylenol followed by NSAID followed by the codeine and to not worry about becoming addicted but to take care of the pain.  Then discussed taking deeper breaths with holding a pillow to keep the ribs from moving.  She is roughly 40% better and I expect her over the next week or 2 to continue to improve.  She seems much more comfortable with that.

## 2023-03-27 ENCOUNTER — Other Ambulatory Visit: Payer: Self-pay | Admitting: Obstetrics & Gynecology

## 2023-03-27 NOTE — Telephone Encounter (Signed)
Med refill request: Yuvafem Last AEX: 04/08/22 Next AEX: not scheduled Last MMG (if hormonal med) 08/07/22 Refill authorized: Please Advise, #[phone], 1 RF

## 2023-03-27 NOTE — Telephone Encounter (Signed)
Patient will be coming for annual soon.  Noted severe osteoporosis on recent bone scan and I didn't see she was on medication.  She reports she just started prolia. Discussed evenity to build her bones for the year then return to prolia.  Patient has her 67 year old mother in a home now with a hip fracture and she is very concerned about it and her risks for fracture and would like the advantage of building the bones first and then return to prolia.  Counseled on the medication with r/b/a/i and to send PA. Counseled on need for calcium and vit D with the medication as it can lower this. She agreed. Message sent to [facility] Dr. Karma Greaser

## 2023-03-31 ENCOUNTER — Telehealth: Payer: Self-pay | Admitting: *Deleted

## 2023-03-31 NOTE — Telephone Encounter (Signed)
Earley Favor, MD  Jetta Lout, RN Just started on prolia Severe osteoporosis. -3.0 Red, white blue plan Needs evenity for the year then return to prolia.  Can you run a PA Last bone scan 2024  Dr. Karma Greaser

## 2023-03-31 NOTE — Telephone Encounter (Signed)
-----   Message from Earley Favor sent at 03/27/2023  8:58 AM EST ----- Sorry this is patient that needs the year of evenity with red white and blue plan Then back on prolia Dr. Karma Greaser

## 2023-04-01 ENCOUNTER — Other Ambulatory Visit (INDEPENDENT_AMBULATORY_CARE_PROVIDER_SITE_OTHER): Payer: HMO

## 2023-04-01 ENCOUNTER — Ambulatory Visit: Payer: HMO | Admitting: Orthopedic Surgery

## 2023-04-01 DIAGNOSIS — S62101D Fracture of unspecified carpal bone, right wrist, subsequent encounter for fracture with routine healing: Secondary | ICD-10-CM | POA: Diagnosis not present

## 2023-04-01 NOTE — Progress Notes (Unsigned)
[name] - 68 y.o. female MRN [id number]  Date of birth: [birth year]  Office Visit Note: Visit Date: 04/01/2023 PCP: Ronnald Nian, MD Referred by: Ronnald Nian, MD  Subjective: No chief complaint on file.  HPI: [name] is a pleasant 68 y.o. female who presents today for follow-up of a nondisplaced right distal radius fracture.  She has been doing well overall, has since discontinued her brace.  She has excellent range of motion, occasional soreness with heavy lifting.  No other significant complaints.  Pertinent ROS were reviewed with the patient and found to be negative unless otherwise specified above in HPI.     Assessment & Plan: Visit Diagnoses:  1. Closed fracture of right wrist with routine healing, subsequent encounter     Plan: Extensive discussion was had the patient today regarding her right wrist injury.  Repeat x-rays today show stable appearance of the distal radius fracture and appropriate healing.  She is achieved nice range of motion and is progressing with strengthening.  She would like to do some occupational therapy in order to continue improving from both a range of motion and strengthening standpoint of the right hand, appropriate to transition to home exercise program as necessary.  We will also place referral to the osteoporosis clinic for evaluation and treatment moving forward given her history of fragility fracture.  Follow-up: No follow-ups on file.   Meds & Orders: No orders of the defined types were placed in this encounter.   Orders Placed This Encounter  Procedures   XR Wrist Complete Right   Ambulatory referral to Occupational Therapy     Procedures: No procedures performed      Clinical History: No specialty comments available.  She reports that she has never smoked. She has never used smokeless tobacco. No results for input(s): "HGBA1C", "LABURIC" in the last 8760 hours.  Objective:   Vital Signs: LMP  (LMP Unknown)  Comment: sexually active  Physical Exam  Gen: Well-appearing, in no acute distress; non-toxic CV: Regular Rate. Well-perfused. Warm.  Resp: Breathing unlabored on room air; no wheezing. Psych: Fluid speech in conversation; appropriate affect; normal thought process  Ortho Exam Right wrist: - No significant tenderness over the metaphyseal region of the distal radius - Skin is intact, mild swelling over the wrist, minimal ecchymosis - Digital range of motion is well-preserved, composite fist without restriction, sensation is well-preserved in all distributions of the hand - Wrist range of motion flexion 75, extension 65 without pain or crepitus - Pronation/supination 75/75  Imaging: X-rays of the right wrist were completed today X-rays demonstrate appropriate healing of the nondisplaced metadiaphyseal distal radius fracture  Past Medical/Family/Surgical/Social History: Medications & Allergies reviewed per EMR, new medications updated. Patient Active Problem List   Diagnosis Date Noted   Hyperlipidemia 03/25/2022   Herpes labialis 02/16/2021   Gastroesophageal reflux disease without esophagitis 02/15/2018   Lactose intolerance 02/15/2018   History of vitamin D deficiency 02/15/2018   Osteoporosis 02/15/2018   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   Spondylosis 05/26/2007   Disorder of gallbladder 03/11/2007   Past Medical History:  Diagnosis Date   Anxiety    very high   GERD (gastroesophageal reflux disease)    Glaucoma    Heart murmur    Pt saw Cardiologist when she was in her 62s, not since.   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   Osteoporosis  Scoliosis    Family History  Problem Relation Age of Onset   Arthritis Mother    Hyperlipidemia Mother    Atrial fibrillation Mother    Skin cancer Father    Depression Father    Parkinson's disease Father    CAD Father    Colon cancer Neg Hx    Colon polyps Neg Hx     Esophageal cancer Neg Hx    Stomach cancer Neg Hx    Rectal cancer Neg Hx    Past Surgical History:  Procedure Laterality Date   CESAREAN SECTION  1988   SHOULDER ARTHROSCOPY WITH SUBACROMIAL DECOMPRESSION AND OPEN ROTATOR C Right 12/09/2013   Procedure: RIGHT SHOULDER ARTHROSCOPY WITH SUBACROMIAL DECOMPRESSION AND MINI OPEN ROTATOR CUFF REPAIR, AND BICEPS TENDODESIS;  Surgeon: Verlee Rossetti, MD;  Location: [facility] OR;  Service: Orthopedics;  Laterality: Right;   TONSILLECTOMY     Social History   Occupational History   Occupation: Management/Librarian  Tobacco Use   Smoking status: Never   Smokeless tobacco: Never  Vaping Use   Vaping status: Never Used  Substance and Sexual Activity   Alcohol use: Yes    Comment: occ   Drug use: No   Sexual activity: Yes    Partners: Male    Birth control/protection: Post-menopausal    Comment: 1st intercourse- 23, partner- 1    [name] (Fara Boros) Denese Killings, M.D. [facility]

## 2023-04-07 DIAGNOSIS — H401131 Primary open-angle glaucoma, bilateral, mild stage: Secondary | ICD-10-CM | POA: Diagnosis not present

## 2023-04-08 ENCOUNTER — Ambulatory Visit: Payer: Medicare Other | Admitting: Family Medicine

## 2023-04-13 ENCOUNTER — Encounter: Payer: Self-pay | Admitting: Physician Assistant

## 2023-04-13 ENCOUNTER — Ambulatory Visit (INDEPENDENT_AMBULATORY_CARE_PROVIDER_SITE_OTHER): Payer: HMO | Admitting: Physician Assistant

## 2023-04-13 VITALS — Ht 63.0 in | Wt 146.6 lb

## 2023-04-13 DIAGNOSIS — M810 Age-related osteoporosis without current pathological fracture: Secondary | ICD-10-CM | POA: Diagnosis not present

## 2023-04-13 NOTE — Progress Notes (Signed)
Office Visit Note   Patient: [name]           Date of Birth: 1955-04-27           MRN: [id number] Visit Date: 04/13/2023              Requested by: Samuella Cota, MD [id number] [place],  [place] [address] PCP: Ronnald Nian, MD   Assessment & Plan: Visit Diagnoses:  1. Age-related osteoporosis without current pathological fracture     Plan: Patient is a pleasant 68 year old woman who was referred from Dr. Fara Boros to the osteoporosis clinic.  She is a pleasant active 68 year old woman.  She is very concerned as her mother recently had a hip fracture and is now on hospice.  She has had Fosamax in the past however her most recent bone density scan her T-score was -3.  She has had 1 Prolia injection.  She does have a history of a wrist fracture and sternal fracture in 2024.  No history of heart disease cancer kidney disease ulcerative disease or gastric bypass.  She does have moderate reflux disease.  She went through menopause at around 48-50 without any hormone treatment.  She varies on her calcium intake.  She does do 2000 to 4000 units a day of vitamin D.  She is never smoked she drinks rarely she does try to get out and walk with a weighted vest.  I did calculate her FRAX score today.  She has a 27% chance of a major osteoporotic fracture in the next 10 years.  She has an 8.3% chance of a hip fracture in the next 10 years.  She is at very high risk given her young age.  We talked about adding some weight training to her regime.  I do think she needs to be on a bone builder such as Evenity.  Will update her labs to see that she is appropriate for this if so she has failed Fosamax treatment and has a worsening T-score.  Will place authorization for Evenity.  Have given her information about Evenity.  Spent well over 30 minutes discussing treatment and treatment options  Follow-Up Instructions: No follow-ups on file.   Orders:  Orders Placed This Encounter  Procedures   Vitamin D (25  hydroxy)   Calcium   TSH   No orders of the defined types were placed in this encounter.     Procedures: No procedures performed   Clinical Data: No additional findings.   Subjective: Chief Complaint  Patient presents with   Osteoporosis    HPI pleasant active 68 year old woman presented as in referral from Dr. Fara Boros for evaluation for osteoporosis treatment.  She does have a very high risk and a T-score of -3.  She currently has tried 1 Prolia injection but her gynecologist feels she needs to be on something that is more bone building  Review of Systems  All other systems reviewed and are negative.    Objective: Vital Signs: Ht 5\' 3"  (1.6 m)   Wt 146 lb 9.6 oz (66.5 kg)   LMP  (LMP Unknown) Comment: sexually active  BMI 25.97 kg/m   Physical Exam Constitutional:      Appearance: Normal appearance.  Pulmonary:     Effort: Pulmonary effort is normal.  Skin:    General: Skin is warm and dry.  Neurological:     General: No focal deficit present.     Mental Status: She is alert and oriented to person,  place, and time.  Psychiatric:        Mood and Affect: Mood normal.        Behavior: Behavior normal.       Specialty Comments:  No specialty comments available.  Imaging: No results found.   PMFS History: Patient Active Problem List   Diagnosis Date Noted   Hyperlipidemia 03/25/2022   Herpes labialis 02/16/2021   Gastroesophageal reflux disease without esophagitis 02/15/2018   Lactose intolerance 02/15/2018   History of vitamin D deficiency 02/15/2018   Age-related osteoporosis without current pathological fracture 02/15/2018   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   Spondylosis 05/26/2007   Disorder of gallbladder 03/11/2007   Past Medical History:  Diagnosis Date   Anxiety    very high   GERD (gastroesophageal reflux disease)    Glaucoma    Heart murmur    Pt saw Cardiologist when she was in her 12s, not since.   IBS (irritable bowel syndrome)    MVP  (mitral valve prolapse)    Neuropathy 2016   in right leg   Osteoporosis    Scoliosis     Family History  Problem Relation Age of Onset   Arthritis Mother    Hyperlipidemia Mother    Atrial fibrillation Mother    Skin cancer Father    Depression Father    Parkinson's disease Father    CAD Father    Colon cancer Neg Hx    Colon polyps Neg Hx    Esophageal cancer Neg Hx    Stomach cancer Neg Hx    Rectal cancer Neg Hx     Past Surgical History:  Procedure Laterality Date   CESAREAN SECTION  1988   SHOULDER ARTHROSCOPY WITH SUBACROMIAL DECOMPRESSION AND OPEN ROTATOR C Right 12/09/2013   Procedure: RIGHT SHOULDER ARTHROSCOPY WITH SUBACROMIAL DECOMPRESSION AND MINI OPEN ROTATOR CUFF REPAIR, AND BICEPS TENDODESIS;  Surgeon: Verlee Rossetti, MD;  Location: [facility] OR;  Service: Orthopedics;  Laterality: Right;   TONSILLECTOMY     Social History   Occupational History   Occupation: Management/Librarian  Tobacco Use   Smoking status: Never   Smokeless tobacco: Never  Vaping Use   Vaping status: Never Used  Substance and Sexual Activity   Alcohol use: Yes    Comment: occ   Drug use: No   Sexual activity: Yes    Partners: Male    Birth control/protection: Post-menopausal    Comment: 1st intercourse- 23, partner- 1

## 2023-04-15 ENCOUNTER — Encounter: Payer: HMO | Admitting: Rehabilitative and Restorative Service Providers"

## 2023-04-15 LAB — VITAMIN D 25 HYDROXY (VIT D DEFICIENCY, FRACTURES): Vit D, 25-Hydroxy: 56 ng/mL (ref 30–100)

## 2023-04-15 LAB — TSH: TSH: 0.62 m[IU]/L (ref 0.40–4.50)

## 2023-04-15 LAB — CALCIUM: Calcium: 9.2 mg/dL (ref 8.6–10.4)

## 2023-04-15 LAB — EXTRA LAV TOP TUBE

## 2023-04-21 ENCOUNTER — Telehealth: Payer: Self-pay

## 2023-04-21 NOTE — Telephone Encounter (Signed)
Submitted for [employer] and [employer] through [employer].

## 2023-05-05 ENCOUNTER — Encounter: Payer: Self-pay | Admitting: Internal Medicine

## 2023-05-06 ENCOUNTER — Telehealth: Payer: Self-pay | Admitting: Physician Assistant

## 2023-05-06 NOTE — Telephone Encounter (Signed)
 Patient called asked if the shots were approved and when can she get started? The number to contact patient is [phone]

## 2023-05-06 NOTE — Telephone Encounter (Signed)
 Will call patient to discuss pricing.

## 2023-05-14 ENCOUNTER — Telehealth: Payer: Self-pay | Admitting: Internal Medicine

## 2023-05-14 DIAGNOSIS — M810 Age-related osteoporosis without current pathological fracture: Secondary | ICD-10-CM

## 2023-05-14 MED ORDER — DENOSUMAB 60 MG/ML ~~LOC~~ SOSY
60.0000 mg | PREFILLED_SYRINGE | Freq: Once | SUBCUTANEOUS | Status: AC
Start: 2023-06-12 — End: 2023-06-29
  Administered 2023-06-29: 60 mg via SUBCUTANEOUS

## 2023-05-14 NOTE — Telephone Encounter (Signed)
 See prolia referral

## 2023-05-21 ENCOUNTER — Ambulatory Visit (HOSPITAL_BASED_OUTPATIENT_CLINIC_OR_DEPARTMENT_OTHER): Admitting: Orthopaedic Surgery

## 2023-05-21 ENCOUNTER — Ambulatory Visit (HOSPITAL_BASED_OUTPATIENT_CLINIC_OR_DEPARTMENT_OTHER)

## 2023-05-21 DIAGNOSIS — M25552 Pain in left hip: Secondary | ICD-10-CM

## 2023-05-21 DIAGNOSIS — G8929 Other chronic pain: Secondary | ICD-10-CM | POA: Diagnosis not present

## 2023-05-21 DIAGNOSIS — M533 Sacrococcygeal disorders, not elsewhere classified: Secondary | ICD-10-CM | POA: Diagnosis not present

## 2023-05-21 NOTE — Progress Notes (Signed)
 Chief Complaint: Left hip pain     History of Present Illness:    [name] is a 68 y.o. female presents today with ongoing hip pain about the left SI joint.  She does have a known history of scoliosis.  She states that 1 month prior she was at a local fair and was on her feet quite significantly throughout the day.  She was standing on 1 leg and she was having persistent left-sided pain.  She does not experience any radiating symptoms    PMH/PSH/Family History/Social History/Meds/Allergies:    Past Medical History:  Diagnosis Date   Anxiety    very high   GERD (gastroesophageal reflux disease)    Glaucoma    Heart murmur    Pt saw Cardiologist when she was in her 7s, not since.   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   Osteoporosis    Scoliosis    Past Surgical History:  Procedure Laterality Date   CESAREAN SECTION  1988   SHOULDER ARTHROSCOPY WITH SUBACROMIAL DECOMPRESSION AND OPEN ROTATOR C Right 12/09/2013   Procedure: RIGHT SHOULDER ARTHROSCOPY WITH SUBACROMIAL DECOMPRESSION AND MINI OPEN ROTATOR CUFF REPAIR, AND BICEPS TENDODESIS;  Surgeon: Verlee Rossetti, MD;  Location: [facility] OR;  Service: Orthopedics;  Laterality: Right;   TONSILLECTOMY     Social History   Socioeconomic History   Marital status: Married    Spouse name: Not on file   Number of children: 2   Years of education: Masters   Highest education level: Not on file  Occupational History   Occupation: Management/Librarian  Tobacco Use   Smoking status: Never   Smokeless tobacco: Never  Vaping Use   Vaping status: Never Used  Substance and Sexual Activity   Alcohol use: Yes    Comment: occ   Drug use: No   Sexual activity: Yes    Partners: Male    Birth control/protection: Post-menopausal    Comment: 1st intercourse- 23, partner- 1  Other Topics Concern   Not on file  Social History Narrative   Lives at home with her husband.    Right-handed.   6-8 cups caffeine per day.   Social Drivers of Corporate investment banker Strain: Not on file  Food Insecurity: Not on file  Transportation Needs: Not on file  Physical Activity: Not on file  Stress: Not on file  Social Connections: Not on file   Family History  Problem Relation Age of Onset   Arthritis Mother    Hyperlipidemia Mother    Atrial fibrillation Mother    Skin cancer Father    Depression Father    Parkinson's disease Father    CAD Father    Colon cancer Neg Hx    Colon polyps Neg Hx    Esophageal cancer Neg Hx    Stomach cancer Neg Hx    Rectal cancer Neg Hx    No Known Allergies Current Outpatient Medications  Medication Sig Dispense Refill   Acetylcarnitine HCl (ACETYL L-CARNITINE PO) Take by mouth daily.     aspirin EC 81 MG tablet Take 81 mg by mouth daily.     Cholecalciferol (VITAMIN D) 2000 UNITS CAPS Take 2,000 Units by mouth daily.     cycloSPORINE (RESTASIS) 0.05 % ophthalmic emulsion Place 1 drop into both eyes 2 (two) times daily.     Estradiol (YUVAFEM) 10 MCG TABS vaginal tablet  PLACE 1 INSERT VAGINALLY TWICE WEEKLY ON TUESDAYS AND FRIDAYS 24 tablet 1   famotidine (PEPCID) 20 MG tablet Take 10 mg by mouth daily.      latanoprost (XALATAN) 0.005 % ophthalmic solution Place 1 drop into both eyes at bedtime.     Multiple Vitamin (MULTIVITAMIN PO) Take by mouth. Takes 1/2 daily     Omega-3 Fatty Acids (FISH OIL PO) Take 1 capsule by mouth daily.     oxyCODONE (ROXICODONE) 5 MG immediate release tablet Take 1 tablet (5 mg total) by mouth every 4 (four) hours as needed for severe pain (pain score 7-10). 20 tablet 0   vitamin C (ASCORBIC ACID) 500 MG tablet Take 500 mg by mouth daily.     Current Facility-Administered Medications  Medication Dose Route Frequency Provider Last Rate Last Admin   [START ON 06/12/2023] denosumab (PROLIA) injection 60 mg  60 mg Subcutaneous Once Ronnald Nian, MD       No results found.  Review of  Systems:   A ROS was performed including pertinent positives and negatives as documented in the HPI.  Physical Exam :   Constitutional: NAD and appears stated age Neurological: Alert and oriented Psych: Appropriate affect and cooperative There were no vitals taken for this visit.   Comprehensive Musculoskeletal Exam:    Tenderness about the left SI joint with positive pain with compression of the iliac crest.  There is positive scoliosis about the lumbar spine.  Negative straight leg raise with distal neurosensory exam of the left   Imaging:   Xray (4 views lumbar spine): Gliosis involving lumbar spine significantly and left SI arthritis    I personally reviewed and interpreted the radiographs.   Assessment and Plan:   68 y.o. 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  Today's visit I do believe she is somewhat overdone it in terms of the SI joint and I do believe an ultrasound-guided injection with Dr. Shon Baton would help her out dramatically to get back to her baseline.  Would also like to enroll her in physical therapy for good core strengthening program that she can work on in terms of maintenance of her scoliosis in the future  -Return to clinic as needed   I personally saw and evaluated the patient, and participated in the management and treatment plan.  [name] Cote, MD Attending Physician, Orthopedic Surgery  This document was dictated using Dragon voice recognition software. A reasonable attempt at proof reading has been made to minimize errors.

## 2023-05-21 NOTE — Addendum Note (Signed)
 Addended by: Jeanella Cara on: 05/21/2023 11:15 AM   Modules accepted: Orders

## 2023-05-21 NOTE — Addendum Note (Signed)
 Addended byCaffie Damme on: 05/21/2023 12:40 PM   Modules accepted: Orders

## 2023-05-22 ENCOUNTER — Telehealth: Payer: Self-pay

## 2023-05-22 NOTE — Telephone Encounter (Signed)
 Talked with patient to advise of pricing for Evenity.  Patient stated that she has to check with her other provider to see if she is already on there calendar and then she will give [place] a call back.

## 2023-06-01 ENCOUNTER — Telehealth: Payer: Self-pay | Admitting: Family Medicine

## 2023-06-01 NOTE — Telephone Encounter (Signed)
 Pt is asking for a call back about her next prolia injection. She comes in for an appointment tomorrow so is asking if you can call her today with an update.

## 2023-06-01 NOTE — Telephone Encounter (Signed)
 Spoke with patient. See referral with note in there.

## 2023-06-02 ENCOUNTER — Encounter: Payer: Self-pay | Admitting: Family Medicine

## 2023-06-02 ENCOUNTER — Ambulatory Visit (INDEPENDENT_AMBULATORY_CARE_PROVIDER_SITE_OTHER): Payer: Medicare Other | Admitting: Family Medicine

## 2023-06-02 VITALS — BP 120/70 | HR 80 | Ht 63.5 in | Wt 146.6 lb

## 2023-06-02 DIAGNOSIS — Z8249 Family history of ischemic heart disease and other diseases of the circulatory system: Secondary | ICD-10-CM | POA: Diagnosis not present

## 2023-06-02 DIAGNOSIS — D1803 Hemangioma of intra-abdominal structures: Secondary | ICD-10-CM

## 2023-06-02 DIAGNOSIS — M810 Age-related osteoporosis without current pathological fracture: Secondary | ICD-10-CM

## 2023-06-02 DIAGNOSIS — E782 Mixed hyperlipidemia: Secondary | ICD-10-CM

## 2023-06-02 DIAGNOSIS — Z0000 Encounter for general adult medical examination without abnormal findings: Secondary | ICD-10-CM

## 2023-06-02 DIAGNOSIS — Z8639 Personal history of other endocrine, nutritional and metabolic disease: Secondary | ICD-10-CM

## 2023-06-02 DIAGNOSIS — K219 Gastro-esophageal reflux disease without esophagitis: Secondary | ICD-10-CM | POA: Diagnosis not present

## 2023-06-02 DIAGNOSIS — E739 Lactose intolerance, unspecified: Secondary | ICD-10-CM

## 2023-06-02 DIAGNOSIS — K829 Disease of gallbladder, unspecified: Secondary | ICD-10-CM

## 2023-06-02 DIAGNOSIS — B001 Herpesviral vesicular dermatitis: Secondary | ICD-10-CM | POA: Diagnosis not present

## 2023-06-02 NOTE — Patient Instructions (Signed)
  [name] , Thank you for taking time to come for your Medicare Wellness Visit. I appreciate your ongoing commitment to your health goals. Please review the following plan we discussed and let me know if I can assist you in the future.   These are the goals we discussed:   This is a list of the screening recommended for you and due dates:  Health Maintenance  Topic Date Due   COVID-19 Vaccine (8 - Pfizer risk 2024-25 season) 09/30/2023   Cologuard (Stool DNA test)  04/04/2024   Medicare Annual Wellness Visit  06/01/2024   Mammogram  08/06/2024   DTaP/Tdap/Td vaccine (2 - Td or Tdap) 05/22/2027   Pneumonia Vaccine  Completed   Flu Shot  Completed   DEXA scan (bone density measurement)  Completed   Hepatitis C Screening  Completed   Zoster (Shingles) Vaccine  Completed   HPV Vaccine  Aged Out   Colon Cancer Screening  Discontinued

## 2023-06-02 NOTE — Progress Notes (Signed)
 [name] is a 68 y.o. female who presents for annual wellness visit,CPE  and follow-up on chronic medical conditions.  She fell recently sustaining a sternal fracture as well as wrist fractures.  She states that now she is doing much better.  She does have an underlying history of osteoporosis and is on Prolia.  She has only had 1 shot.  She also is on estradiol from her gynecologist.  She does have reflux disease and is taking famotidine on a daily basis that she states keeps the symptoms under fairly good control but plans to get an appointment with GI to follow-up on this for possible endoscopy.  She now states that she has a family history of abdominal aortic aneurysm with her father having a stent placed.  She does not smoke and rarely drinks.  Home life is going quite well.  Immunizations and Health Maintenance Immunization History  Administered Date(s) Administered   Influenza, High Dose Seasonal PF 11/15/2021, 11/07/2022   Influenza,inj,Quad PF,6+ Mos 11/26/2016, 10/31/2017, 10/22/2018, 12/02/2019, 12/19/2020   Moderna Covid-19 Fall Seasonal Vaccine 59yrs & older 06/24/2022   PFIZER Comirnaty(Gray Top)Covid-19 Tri-Sucrose Vaccine 09/27/2020   PFIZER(Purple Top)SARS-COV-2 Vaccination 05/15/2019, 06/14/2019, 01/03/2020   PNEUMOCOCCAL CONJUGATE-20 03/26/2022   Pfizer(Comirnaty)Fall Seasonal Vaccine 12 years and older 04/02/2023   Tdap 05/21/2017   Unspecified SARS-COV-2 Vaccination 12/20/2021   Zoster Recombinant(Shingrix) 02/15/2018, 04/19/2018   Health Maintenance Due  Topic Date Due   Medicare Annual Wellness (AWV)  03/27/2023    Last Pap smear:2023 Last mammogram:07/2022 Last colonoscopy:2019, Cologuard 2023 Last DEXA:09/2022 Dentist:2024 Ophtho:every 6 month Exercise:few times a week  Other doctors caring for patient include: Dentist:Dr. Dennison Mascot Optho:[facility], Dr. Cathey Endow QM:[facility] Card:Dr. Jens Som GYN: [facility] GYN [facility]o:[facility]  Advanced  directives:none    Depression screen:  See questionnaire below.     06/02/2023    3:10 PM 06/02/2023    3:09 PM 03/26/2022    9:31 AM 03/19/2021    9:43 AM 03/05/2020    9:34 AM  Depression screen PHQ 2/9  Decreased Interest 0 0 0 0 0  Down, Depressed, Hopeless 0 0 0 0 0  PHQ - 2 Score 0 0 0 0 0    Fall Risk Screen: see questionnaire below.    06/02/2023    3:07 PM 04/08/2022   10:39 AM 03/26/2022    9:31 AM 03/19/2021    9:43 AM  Fall Risk   Falls in the past year? 1 1 1  0  Number falls in past yr: 0 0 0 0  Comment 02/2023 fell down the stairs broken rt ankle    Injury with Fall? 1 1 1  0  Comment right wrist fx and sternum     Risk for fall due to : History of fall(s) History of fall(s) No Fall Risks No Fall Risks  Follow up Falls evaluation completed Falls evaluation completed;Falls prevention discussed Falls evaluation completed Falls evaluation completed    ADL screen:  See questionnaire below Functional Status Survey: Is the patient deaf or have difficulty hearing?: No Does the patient have difficulty seeing, even when wearing glasses/contacts?: No Does the patient have difficulty concentrating, remembering, or making decisions?: Yes (age related memory) Does the patient have difficulty walking or climbing stairs?: No Does the patient have difficulty dressing or bathing?: No Does the patient have difficulty doing errands alone such as visiting a doctor's office or shopping?: No Family and social history as well as health maintenance and immunizations was reviewed  Review of Systems Constitutional: -, -  unexpected weight change, -anorexia, -fatigue Allergy: -sneezing, -itching, -congestion Dermatology: denies changing moles, rash, lumps ENT: -runny nose, -ear pain, -sore throat,  Cardiology:  -chest pain, -palpitations, -orthopnea, Respiratory: -cough, -shortness of breath, -dyspnea on exertion, -wheezing,  Gastroenterology: -abdominal pain, -nausea, -vomiting,  -diarrhea, -constipation, -dysphagia Hematology: -bleeding or bruising problems Musculoskeletal: -arthralgias, -myalgias, -joint swelling, -back pain, - Ophthalmology: -vision changes,  Urology: -dysuria, -difficulty urinating,  -urinary frequency, -urgency, incontinence Neurology: -, -numbness, , -memory loss, -falls, -dizziness    PHYSICAL EXAM:  BP 120/70   Pulse 80   Ht 5' 3.5" (1.613 m)   Wt 146 lb 9.6 oz (66.5 kg)   LMP  (LMP Unknown) Comment: sexually active  SpO2 98%   BMI 25.56 kg/m   General Appearance: Alert, cooperative, no distress, appears stated age Head: Normocephalic, without obvious abnormality, atraumatic Eyes: PERRL, conjunctiva/corneas clear, EOM's intact,  Ears: Normal TM's and external ear canals Nose: Nares normal, mucosa normal, no drainage or sinus tenderness Throat: Lips, mucosa, and tongue normal; teeth and gums normal Neck: Supple, no lymphadenopathy;  thyroid:  no enlargement/tenderness/nodules; no carotid bruit or JVD Lungs: Clear to auscultation bilaterally without wheezes, rales or ronchi; respirations unlabored Heart: Regular rate and rhythm, S1 and S2 normal, no murmur, rubor gallop Abdomen: Soft, non-tender, nondistended, normoactive bowel sounds,  no masses, no hepatosplenomegaly  Skin:  Skin color, texture, turgor normal, no rashes or lesions Lymph nodes: Cervical, supraclavicular, and axillary nodes normal Neurologic:  CNII-XII intact, normal strength, sensation and gait; reflexes 2+ and symmetric throughout Psych: Normal mood, affect, hygiene and grooming.  ASSESSMENT/PLAN: Routine general medical examination at a health care facility  Age-related osteoporosis without current pathological fracture  Disorder of gallbladder  Gastroesophageal reflux disease without esophagitis  Herpes labialis  History of vitamin D deficiency  Mixed hyperlipidemia - Plan: Lipid panel  Lactose intolerance  Family history of abdominal aortic  aneurysm - Plan: US AORTA MEDICARE SCREENING    Discussed at least 30 minutes of aerobic activity at least 5 days/week and weight-bearing exercise 2x/week;  healthy diet, including goals of calcium and vitamin D intake and alcohol recommendations (less than or equal to 1 drink/day) reviewed;  Immunization recommendations discussed.  Colonoscopy recommendations reviewed.  Continue Prolia She will continue on Prolia.  She did discuss using Evenity but at this point she is not interested in pursuing that.  She will follow-up with GI concerning possible another endoscopy since she is kind of holding her own with famotidine rather than keep it under good control.  Because she has a family history of AAA I think it is reasonable to check this out.  Medicare Attestation I have personally reviewed: The patient's medical and social history Their use of alcohol, tobacco or illicit drugs Their current medications and supplements The patient's functional ability including ADLs,fall risks, home safety risks, cognitive, and hearing and visual impairment Diet and physical activities Evidence for depression or mood disorders  The patient's weight, height, and BMI have been recorded in the chart.  I have made referrals, counseling, and provided education to the patient based on review of the above and I have provided the patient with a written personalized care plan for preventive services.     Sharlot Gowda, MD   06/02/2023

## 2023-06-03 ENCOUNTER — Encounter: Payer: Self-pay | Admitting: Family Medicine

## 2023-06-03 LAB — LIPID PANEL
Chol/HDL Ratio: 2.9 ratio (ref 0.0–4.4)
Cholesterol, Total: 210 mg/dL — ABNORMAL HIGH (ref 100–199)
HDL: 73 mg/dL (ref >39)
LDL Chol Calc (NIH): 125 mg/dL — ABNORMAL HIGH (ref 0–99)
Triglycerides: 66 mg/dL (ref 0–149)
VLDL Cholesterol Cal: 12 mg/dL (ref 5–40)

## 2023-06-04 ENCOUNTER — Encounter (HOSPITAL_BASED_OUTPATIENT_CLINIC_OR_DEPARTMENT_OTHER): Payer: Self-pay | Admitting: Orthopaedic Surgery

## 2023-06-05 ENCOUNTER — Ambulatory Visit
Admission: RE | Admit: 2023-06-05 | Discharge: 2023-06-05 | Disposition: A | Discharge status: Home / Self Care | Source: Ambulatory Visit | Attending: Family Medicine | Admitting: Family Medicine

## 2023-06-05 ENCOUNTER — Telehealth: Payer: Self-pay

## 2023-06-05 ENCOUNTER — Encounter: Payer: Self-pay | Admitting: Family Medicine

## 2023-06-05 ENCOUNTER — Other Ambulatory Visit (HOSPITAL_COMMUNITY): Payer: Self-pay

## 2023-06-05 DIAGNOSIS — K219 Gastro-esophageal reflux disease without esophagitis: Secondary | ICD-10-CM | POA: Diagnosis not present

## 2023-06-05 DIAGNOSIS — Z8249 Family history of ischemic heart disease and other diseases of the circulatory system: Secondary | ICD-10-CM | POA: Diagnosis not present

## 2023-06-05 DIAGNOSIS — Z136 Encounter for screening for cardiovascular disorders: Secondary | ICD-10-CM | POA: Diagnosis not present

## 2023-06-05 DIAGNOSIS — E785 Hyperlipidemia, unspecified: Secondary | ICD-10-CM | POA: Diagnosis not present

## 2023-06-05 NOTE — Telephone Encounter (Signed)
 Pt ready for scheduling for PROLIA on or after : 06/10/23  Option# 1: Buy/Bill (Office supplied medication)  Out-of-pocket cost due at time of clinic visit: $357  Number of injection/visits approved: ---  Primary: [employer] Prolia co-insurance: 20% Admin fee co-insurance: 20%  Secondary: --- Prolia co-insurance:  Admin fee co-insurance:   Medical Benefit Details: Date Benefits were checked: 04/22/23 Deductible: NO/ Coinsurance: 20%/ Admin Fee: 20%  Prior Auth: N/A PA# Expiration Date:   # of doses approved: ----------------------------------------------------------------------- Option# 2- Med Obtained from pharmacy:  Pharmacy benefit: Copay [phone] (Paid to pharmacy) Admin Fee: 20% (about  $25) (Pay at clinic)  Prior Auth: n/a PA# Expiration Date:   # of doses approved:   If patient wants fill through the pharmacy benefit please send prescription to: [employer], and include estimated need by date in rx notes. Pharmacy will ship medication directly to the office.  Patient NOT eligible for Prolia Copay Card. Copay Card can make patient's cost as little as $25. Link to apply: [email or web address]  ** This summary of benefits is an estimation of the patient's out-of-pocket cost. Exact cost may very based on individual plan coverage.

## 2023-06-16 ENCOUNTER — Telehealth (HOSPITAL_BASED_OUTPATIENT_CLINIC_OR_DEPARTMENT_OTHER): Payer: Self-pay | Admitting: Physical Therapy

## 2023-06-16 NOTE — Telephone Encounter (Signed)
 Called and spoke to patient to remind patient of upcoming physical therapy evaluation appointment. Pt confirmed appt and will be in attendance.

## 2023-06-17 ENCOUNTER — Encounter: Payer: Self-pay | Admitting: Sports Medicine

## 2023-06-17 ENCOUNTER — Ambulatory Visit: Admitting: Sports Medicine

## 2023-06-17 ENCOUNTER — Other Ambulatory Visit: Payer: Self-pay

## 2023-06-17 ENCOUNTER — Ambulatory Visit

## 2023-06-17 DIAGNOSIS — M7918 Myalgia, other site: Secondary | ICD-10-CM | POA: Diagnosis not present

## 2023-06-17 DIAGNOSIS — M4126 Other idiopathic scoliosis, lumbar region: Secondary | ICD-10-CM | POA: Diagnosis not present

## 2023-06-17 DIAGNOSIS — M810 Age-related osteoporosis without current pathological fracture: Secondary | ICD-10-CM

## 2023-06-17 DIAGNOSIS — G8929 Other chronic pain: Secondary | ICD-10-CM

## 2023-06-17 DIAGNOSIS — M533 Sacrococcygeal disorders, not elsewhere classified: Secondary | ICD-10-CM | POA: Diagnosis not present

## 2023-06-17 NOTE — Therapy (Signed)
 OUTPATIENT PHYSICAL THERAPY THORACOLUMBAR EVALUATION   Patient Name: [name] MRN: [id number] DOB:Oct 04, 1955, 68 y.o., female Today's Date: 06/17/2023  END OF SESSION:   Past Medical History:  Diagnosis Date   Anxiety    very high   GERD (gastroesophageal reflux disease)    Glaucoma    Heart murmur    Pt saw Cardiologist when she was in her 46s, not since.   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   Osteoporosis    Scoliosis    Past Surgical History:  Procedure Laterality Date   CESAREAN SECTION  1988   SHOULDER ARTHROSCOPY WITH SUBACROMIAL DECOMPRESSION AND OPEN ROTATOR C Right 12/09/2013   Procedure: RIGHT SHOULDER ARTHROSCOPY WITH SUBACROMIAL DECOMPRESSION AND MINI OPEN ROTATOR CUFF REPAIR, AND BICEPS TENDODESIS;  Surgeon: Verlee Rossetti, MD;  Location: [facility] OR;  Service: Orthopedics;  Laterality: Right;   TONSILLECTOMY     Patient Active Problem List   Diagnosis Date Noted   Hyperlipidemia 03/25/2022   Herpes labialis 02/16/2021   Gastroesophageal reflux disease without esophagitis 02/15/2018   Lactose intolerance 02/15/2018   History of vitamin D deficiency 02/15/2018   Age-related osteoporosis without current pathological fracture 02/15/2018   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   Spondylosis 05/26/2007   Disorder of gallbladder 03/11/2007    PCP: Ronnald Nian, MD  REFERRING PROVIDER: Huel Cote, MD  REFERRING DIAG: [phone] (ICD-10-CM) - Chronic left SI joint pain  Rationale for Evaluation and Treatment: Rehabilitation  THERAPY DIAG:  No diagnosis found.  ONSET DATE: February 2025  SUBJECTIVE:                                                                                                                                                                                           SUBJECTIVE STATEMENT: She states that her pain began approx 2 months prior.  Pt denies any specific MOI.  She  noticed a lot of pain when she was on her feet for an extended amount of time at a local fair.  Pt saw Dr. Steward Drone on 05/21/23. He ordered PT and recommended an US guided SI injection.  MD note indicated PT for a good core strengthening program.  PT order indicated Lumbar and core program.  Pt saw Dr. Shon Baton yesterday.  Pt received an ultrasound-guided left SI joint injection yesterday at 8:45.  Pt states MD informed her she was ok to come to PT today.   Pt has occasional disturbed sleep due to pain.  Pt has increased pain with prolonged standing.  She takes the weight off of  L LE and increases Wb'ing through R LE which reduces her pain.       PERTINENT HISTORY:  Osteoporosis history of scoliosis with S curve and levoscoliosis at lumbar R shoulder RCR 2015 R wrist and sternal fx from fall in December    PAIN:  Are you having pain? Yes NPRS:  1-2/10 current, 6/10 worst, 0/10 best Location:  posterolateral L hip, glute Aggravating factors:  prolonged standing, sleeping Easing factors:  sitting, taking weight off of LE  PRECAUTIONS: {Therapy precautions:24002}  RED FLAGS: {PT Red Flags:29287}   WEIGHT BEARING RESTRICTIONS: No  FALLS:  Has patient fallen in last 6 months? Yes. Number of falls 1 fall in December  LIVING ENVIRONMENT: Lives with: lives with their spouse Lives in: 2 story home Stairs: yes  OCCUPATION:  Pt is retired  PLOF: Independent  PATIENT GOALS: improve core strength, to know better positions for posture, improve posture, reduce pain   OBJECTIVE:  Note: Objective measures were completed at Evaluation unless otherwise noted.  DIAGNOSTIC FINDINGS:  X rays on 05/21/23:  (per Epic) FINDINGS: There is no evidence of hip fracture or dislocation. There is no evidence of arthropathy or other focal bone abnormality.   IMPRESSION: Negative.  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  PATIENT SURVEYS:  Modified  Oswestry 10%   COGNITION: Overall cognitive status: Within functional limits for tasks assessed      MUSCLE LENGTH: Hamstrings: Right *** deg; Left *** deg Maisie Fus test: Right *** deg; Left *** deg  POSTURE: R IC higher, in minimal R SB  PALPATION: Tender to palpate L glute especially superior glute  LUMBAR ROM:   AROM eval  Flexion   Extension   Right lateral flexion   Left lateral flexion   Right rotation   Left rotation    (Blank rows = not tested)  LOWER EXTREMITY STRENGTH:     MMT  Right eval Left eval  Hip flexion 4/5 4-/5  Hip extension    Hip abduction 5/5 4+/5  Hip adduction    Hip internal rotation    Hip external rotation 4+/5 4/5  Knee flexion  5/5 seated  Knee extension 5/5 5/5  Ankle dorsiflexion    Ankle plantarflexion    Ankle inversion    Ankle eversion     (Blank rows = not tested)  LOWER EXTREMITY MMT:    MMT Right eval Left eval  Hip flexion    Hip extension    Hip abduction    Hip adduction    Hip internal rotation    Hip external rotation    Knee flexion    Knee extension    Ankle dorsiflexion    Ankle plantarflexion    Ankle inversion    Ankle eversion     (Blank rows = not tested)  LUMBAR SPECIAL TESTS:  {lumbar special test:25242}  FUNCTIONAL TESTS:  {Functional tests:24029}  GAIT: Assistive device utilized: None Level of assistance: Complete Independence Comments: Pt ambulates with a heel to toe gait pattern without limping.  R LE toe out  TREATMENT DATE: ***  PATIENT EDUCATION:  Education details: *** Person educated: {Person educated:25204} Education method: {Education Method:25205} Education comprehension: {Education Comprehension:25206}  HOME EXERCISE PROGRAM: ***  ASSESSMENT:  CLINICAL IMPRESSION: Patient is a *** y.o. *** who was seen today for physical therapy evaluation  and treatment for ***.   OBJECTIVE IMPAIRMENTS: {opptimpairments:25111}.   ACTIVITY LIMITATIONS: {activitylimitations:27494}  PARTICIPATION LIMITATIONS: {participationrestrictions:25113}  PERSONAL FACTORS: {Personal factors:25162} are also affecting patient's functional outcome.   REHAB POTENTIAL: {rehabpotential:25112}  CLINICAL DECISION MAKING: {clinical decision making:25114}  EVALUATION COMPLEXITY: {Evaluation complexity:25115}   GOALS: Goals reviewed with patient? {yes/no:20286}  SHORT TERM GOALS: Target date: ***  *** Baseline: Goal status: INITIAL  2.  *** Baseline:  Goal status: INITIAL  3.  *** Baseline:  Goal status: INITIAL  4.  *** Baseline:  Goal status: INITIAL  5.  *** Baseline:  Goal status: INITIAL  6.  *** Baseline:  Goal status: INITIAL  LONG TERM GOALS: Target date: ***  *** Baseline:  Goal status: INITIAL  2.  *** Baseline:  Goal status: INITIAL  3.  *** Baseline:  Goal status: INITIAL  4.  *** Baseline:  Goal status: INITIAL  5.  *** Baseline:  Goal status: INITIAL  6.  *** Baseline:  Goal status: INITIAL  PLAN:  PT FREQUENCY: {rehab frequency:25116}  PT DURATION: {rehab duration:25117}  PLANNED INTERVENTIONS: {rehab planned interventions:25118::"97110-Therapeutic exercises","97530- Therapeutic [phone]- Neuromuscular re-education","97535- Self JXBJ","47829- Manual therapy"}.  PLAN FOR NEXT SESSION: ***   Aaron Edelman, PT 06/17/2023, 9:56 PM

## 2023-06-17 NOTE — Progress Notes (Signed)
 [name] - 68 y.o. female MRN [id number]  Date of birth: 11-Mar-1955  Office Visit Note: Visit Date: 06/17/2023 PCP: Ronnald Nian, MD Referred by: Ronnald Nian, MD  Subjective: Chief Complaint  Patient presents with   Lower Back - Pain   HPI: [name] is [name] pleasant 68 y.o. female who presents today for evaluation of left-sided low back/SI joint pain.  [name] has had chronic pain about the left posterior hip near the SI joint and posterior buttock.  She has [name] known history of scoliosis with alteration in gait.  About 2 months ago she was at [name] local fair and had to stand for long periods of time which exacerbated her symptoms.  With normal walking she never usually has issues, but with prolonged standing she will get reproduction of her pain on the left posterior side near the SI joint.  She has no numbness or tingling.  She is planning on getting started in formalized physical therapy.  She does have osteoporosis with [name] T-score of -3, she is on Prolia.  She is asking about continuing Prolia/delaying given corticosteroid injection today.  She has [name] history of osteoporotic related fractures as well.  She has been seen in our clinic in the past with attempt to get started on Evenity.  Pertinent ROS were reviewed with the patient and found to be negative unless otherwise specified above in HPI.   Assessment & Plan: Visit Diagnoses:  1. Chronic left SI joint pain   2. Left buttock pain   3. Age-related osteoporosis without current pathological fracture   4. Other idiopathic scoliosis, lumbar region    Plan: Impression is chronic left SI joint and low back/buttock pain that worsens with prolonged standing.  This is exacerbated secondary to her scoliosis.  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  She does have [name] very slight leg length discrepancy secondary to her scoliosis, given this is scoliosis in nature I do not think [name] heel lift  would be advisable.  I did recommend exercises such as tai chi, Pilates, yoga to help with core and overall body stability.  She will progress through physical therapy as well.  Through shared decision making, we did proceed with an ultrasound-guided left SI joint injection, patient tolerated well.  We discussed the risk/benefits/indications of performing this with her Prolia.  Did discuss small increase in risk of infection with concomitant injection therapy and very low risk but still present risk of ON of jaw.  Given this, advised on delaying Prolia injection by 2 weeks, patient is agreeable.  We did have [name] discussion regarding her osteoporosis with [name] T-score of -3.  I do think getting her evaluated for Evenity or an anabolic bone building agent would be advisable and lieu of her Prolia 60 mg which she will continue IM q 6 months.  For the left SI joint, she may follow-up with Dr. Steward Drone or myself as needed.  Discussed could consider Tymlos as well - she will check with insurance  Follow-up: Return if symptoms worsen or fail to improve, for with [facility].   Meds & Orders: No orders of the defined types were placed in this encounter.   Orders Placed This Encounter  Procedures   US Guided Needle Placement - No Linked Charges     Procedures: U/S-guided SI-joint injection, left   After discussion of risk/benefits/indications, informed verbal consent was obtained. [name] timeout was then performed. The patient was  positioned in [name] prone position on exam room table with [name] pillow placed under the pelvis for mild hip flexion. The SI joint area was cleaned and prepped with betadine and alcohol swabs. Sterile ultrasound gel was applied and the ultrasound transducer was placed in an anatomic axial plane over the PSIS, then moved distally over the SI-joint. Using ultrasound guidance, [name] 22-gauge, 3.5" needle was inserted from [name] medial to lateral approach utilizing an in-plane approach and directed into the SI-joint.  The SI-joint was then injected with [name] mixture of 4:2 lidocaine:depomedrol with visualization of the injectate flow into the SI-joint under ultrasound visualization. The patient tolerated the procedure well without immediate complications.       Clinical History: No specialty comments available.  She reports that she has never smoked. She has never used smokeless tobacco. No results for input(s): "HGBA1C", "LABURIC" in the last 8760 hours.  Objective:   Vital Signs: LMP  (LMP Unknown) Comment: sexually active  Physical Exam  Gen: Well-appearing, in no acute distress; non-toxic CV: Well-perfused. Warm.  Resp: Breathing unlabored on room air; no wheezing. Psych: Fluid speech in conversation; appropriate affect; normal thought process  Ortho Exam - Lumbar/SI/Buttock: There is an S-curve of the thoracolumbar spine with levoscoliosis of the lumbar region.  Evaluation of gait does show [name] outward swing of the contralateral leg given the scoliotic based leg length discrepancy.  Positive TTP over the left SI joint, positive SI joint compression, positive Fortin's point test.  Imaging:  *Independent review and interpretation of 4 view left hip x-ray shows no significant arthritic change about the hip.  There is quite notable levoscoliosis of the lumbar spine that is incompletely visualized on x-ray of the hip. Narrative & Impression  CLINICAL DATA:  Left hip pain for 1 month.   EXAM: DG HIP (WITH OR WITHOUT PELVIS) 4+V LEFT   COMPARISON:  None Available.   FINDINGS: There is no evidence of hip fracture or dislocation. There is no evidence of arthropathy or other focal bone abnormality.   IMPRESSION: Negative.     Electronically Signed   By: Lupita Raider M.D.   On: 06/07/2023 13:15    Past Medical/Family/Surgical/Social History: Medications & Allergies reviewed per EMR, new medications updated. Patient Active Problem List   Diagnosis Date Noted   Hyperlipidemia 03/25/2022    Herpes labialis 02/16/2021   Gastroesophageal reflux disease without esophagitis 02/15/2018   Lactose intolerance 02/15/2018   History of vitamin D deficiency 02/15/2018   Age-related osteoporosis without current pathological fracture 02/15/2018   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   Spondylosis 05/26/2007   Disorder of gallbladder 03/11/2007   Past Medical History:  Diagnosis Date   Anxiety    very high   GERD (gastroesophageal reflux disease)    Glaucoma    Heart murmur    Pt saw Cardiologist when she was in her 79s, not since.   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   Osteoporosis    Scoliosis    Family History  Problem Relation Age of Onset   Arthritis Mother    Hyperlipidemia Mother    Atrial fibrillation Mother    Skin cancer Father    Depression Father    Parkinson's disease Father    CAD Father    Colon cancer Neg Hx    Colon polyps Neg Hx    Esophageal cancer Neg Hx    Stomach cancer Neg Hx  Rectal cancer Neg Hx    Past Surgical History:  Procedure Laterality Date   CESAREAN SECTION  1988   SHOULDER ARTHROSCOPY WITH SUBACROMIAL DECOMPRESSION AND OPEN ROTATOR C Right 12/09/2013   Procedure: RIGHT SHOULDER ARTHROSCOPY WITH SUBACROMIAL DECOMPRESSION AND MINI OPEN ROTATOR CUFF REPAIR, AND BICEPS TENDODESIS;  Surgeon: Verlee Rossetti, MD;  Location: [facility] OR;  Service: Orthopedics;  Laterality: Right;   TONSILLECTOMY     Social History   Occupational History   Occupation: Management/Librarian  Tobacco Use   Smoking status: Never   Smokeless tobacco: Never  Vaping Use   Vaping status: Never Used  Substance and Sexual Activity   Alcohol use: Yes    Comment: occ   Drug use: No   Sexual activity: Yes    Partners: Male    Birth control/protection: Post-menopausal    Comment: 1st intercourse- 23, partner- 1

## 2023-06-18 ENCOUNTER — Ambulatory Visit (HOSPITAL_BASED_OUTPATIENT_CLINIC_OR_DEPARTMENT_OTHER): Attending: Orthopaedic Surgery | Admitting: Physical Therapy

## 2023-06-18 ENCOUNTER — Other Ambulatory Visit: Payer: Self-pay

## 2023-06-18 ENCOUNTER — Encounter (HOSPITAL_BASED_OUTPATIENT_CLINIC_OR_DEPARTMENT_OTHER): Payer: Self-pay | Admitting: Physical Therapy

## 2023-06-18 ENCOUNTER — Telehealth: Payer: Self-pay

## 2023-06-18 DIAGNOSIS — M25552 Pain in left hip: Secondary | ICD-10-CM | POA: Diagnosis not present

## 2023-06-18 DIAGNOSIS — G8929 Other chronic pain: Secondary | ICD-10-CM | POA: Diagnosis not present

## 2023-06-18 DIAGNOSIS — M810 Age-related osteoporosis without current pathological fracture: Secondary | ICD-10-CM

## 2023-06-18 DIAGNOSIS — M533 Sacrococcygeal disorders, not elsewhere classified: Secondary | ICD-10-CM | POA: Insufficient documentation

## 2023-06-18 DIAGNOSIS — M6281 Muscle weakness (generalized): Secondary | ICD-10-CM | POA: Diagnosis not present

## 2023-06-18 NOTE — Telephone Encounter (Signed)
-----   Message from Madelyn Brunner sent at 06/17/2023  9:31 AM EDT ----- Regarding: Osteoporosis meds - Evenity or Tymlos This patient was asking about coverage for Evenity. Wanted to see if her insurance would cover it? Per her discussion, sounds like they would not at all. Maybe we could re-circle for either one of these agents?  Annabelle Harman

## 2023-06-18 NOTE — Telephone Encounter (Signed)
 Talked with patient and advised her of the prices for Evenity.  Stated that she will think about it and give me a call back.

## 2023-06-19 ENCOUNTER — Telehealth: Payer: Self-pay

## 2023-06-19 NOTE — Telephone Encounter (Signed)
 ordered

## 2023-06-19 NOTE — Telephone Encounter (Signed)
 Please order Evenity for patient. Thank you.

## 2023-06-19 NOTE — Telephone Encounter (Signed)
 Copied from CRM [id number]. Topic: Clinical - Medical Advice >> Jun 19, 2023  3:39 PM Pierre Bali B wrote: Reason for CRM: pt called in to see if [name] can give her a call in regarding to the  denosumab (PROLIA) injection 60 mg .

## 2023-06-22 NOTE — Telephone Encounter (Signed)
 Patient is calling back to ask [name] to call about the below request. Please advise

## 2023-06-22 NOTE — Telephone Encounter (Signed)
 FYI

## 2023-06-23 ENCOUNTER — Encounter (HOSPITAL_BASED_OUTPATIENT_CLINIC_OR_DEPARTMENT_OTHER): Payer: Self-pay | Admitting: Physical Therapy

## 2023-06-23 ENCOUNTER — Ambulatory Visit (HOSPITAL_BASED_OUTPATIENT_CLINIC_OR_DEPARTMENT_OTHER): Admitting: Physical Therapy

## 2023-06-23 DIAGNOSIS — M6281 Muscle weakness (generalized): Secondary | ICD-10-CM

## 2023-06-23 DIAGNOSIS — M25552 Pain in left hip: Secondary | ICD-10-CM

## 2023-06-23 NOTE — Therapy (Signed)
 OUTPATIENT PHYSICAL THERAPY THORACOLUMBAR TREATMENT   Patient Name: [name] MRN: [id number] DOB:08-Jul-1955, 68 y.o., female Today's Date: 06/23/2023  END OF SESSION:  PT End of Session - 06/23/23 1204     Visit Number 2    Number of Visits 12    Date for PT Re-Evaluation 07/30/23    Authorization Type HTA    PT Start Time 1101    PT Stop Time 1146    PT Time Calculation (min) 45 min    Activity Tolerance Patient tolerated treatment well    Behavior During Therapy WFL for tasks assessed/performed              Past Medical History:  Diagnosis Date   Anxiety    very high   GERD (gastroesophageal reflux disease)    Glaucoma    Heart murmur    Pt saw Cardiologist when she was in her 63s, not since.   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   Osteoporosis    Scoliosis    Past Surgical History:  Procedure Laterality Date   CESAREAN SECTION  1988   SHOULDER ARTHROSCOPY WITH SUBACROMIAL DECOMPRESSION AND OPEN ROTATOR C Right 12/09/2013   Procedure: RIGHT SHOULDER ARTHROSCOPY WITH SUBACROMIAL DECOMPRESSION AND MINI OPEN ROTATOR CUFF REPAIR, AND BICEPS TENDODESIS;  Surgeon: Lorriane Rote, MD;  Location: [facility] OR;  Service: Orthopedics;  Laterality: Right;   TONSILLECTOMY     Patient Active Problem List   Diagnosis Date Noted   Hyperlipidemia 03/25/2022   Herpes labialis 02/16/2021   Gastroesophageal reflux disease without esophagitis 02/15/2018   Lactose intolerance 02/15/2018   History of vitamin D deficiency 02/15/2018   Age-related osteoporosis without current pathological fracture 02/15/2018   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   Spondylosis 05/26/2007   Disorder of gallbladder 03/11/2007    PCP: Watson Hacking, MD  REFERRING PROVIDER: Wilhelmenia Harada, MD  REFERRING DIAG: [phone] (ICD-10-CM) - Chronic left SI joint pain  Rationale for Evaluation and Treatment: Rehabilitation  THERAPY DIAG:  Pain  in left hip  Muscle weakness (generalized)  ONSET DATE: February 2025  SUBJECTIVE:                                                                                                                                                                                           SUBJECTIVE STATEMENT: She states she is feeling better since the injection.  She is not having pain at the spot like before, but her pain does come and go.  She thinks the injection helped. Pt states she is starting  injections for osteoporosis next week.  Pt denies any adverse effects after prior Rx.    Pt has occasional disturbed sleep due to pain.  Pt has increased pain with prolonged standing.  She takes the weight off of L LE and increases Wb'ing through R LE which reduces her pain.       PERTINENT HISTORY:  Osteoporosis history of scoliosis with S curve and levoscoliosis at lumbar R shoulder RCR 2015 R wrist and sternal fx from fall in December    PAIN:  Are you having pain? Yes NPRS:  0-1/10 current, 6/10 worst, 0/10 best Location:  posterolateral L hip, glute, central lumbar Aggravating factors:  prolonged standing, sleeping Easing factors:  sitting, taking weight off of LE  PRECAUTIONS: Other: osteoporosis    WEIGHT BEARING RESTRICTIONS: No  FALLS:  Has patient fallen in last 6 months? Yes. Number of falls 1 fall in December  LIVING ENVIRONMENT: Lives with: lives with their spouse Lives in: 2 story home Stairs: yes  OCCUPATION:  Pt is retired  PLOF: Independent  PATIENT GOALS: improve core strength, to know better positions for posture, improve posture, reduce pain   OBJECTIVE:  Note: Objective measures were completed at Evaluation unless otherwise noted.  DIAGNOSTIC FINDINGS:  X rays on 05/21/23:  (per Epic) FINDINGS: There is no evidence of hip fracture or dislocation. There is no evidence of arthropathy or other focal bone abnormality.   IMPRESSION: Negative.  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 Assessed HS flexibility:  WFL  Pt performed Hooklying hip abd isometrics  and add isometrics 2x10 each with 5 sec hold. PT educated pt in TrA contractions including correct palpation and peformance.  Pt performed TrA contractions with and without a 5 sec hold. Pt received a HEP handout and was educated in correct form and appropriate frequency.  PT instructed pt she should not have pain with HEP.   See below for pt education  Manual Therapy: STM to bilat glutes and used the roller on L glute in S/L'ing with pillow b/w knees.    PATIENT EDUCATION:  Education details: dx, POC, relevant anatomy, exercise form, HEP, rationale of interventions, and what to expect next Rx.  PT answered pt's questions.  Person educated: Patient Education method: Explanation, demonstration, verbal and tactile cues, handout Education comprehension: verbalized understanding, returned demonstration, verbal and tactile cues required  HOME EXERCISE PROGRAM: Access Code: [email or web address] URL: [email or web address] Date: 06/23/2023 Prepared by: Aaron Edelman  Exercises - Hooklying Isometric Hip Abduction with Belt  - 1 x daily - 7 x weekly - 2 sets - 10 reps - 5 seconds hold - Supine Hip Adduction Isometric  with Ball  - 1 x daily - 7 x weekly - 2 sets - 10 reps - Supine Transversus Abdominis Bracing - Hands on Stomach  - 2 x daily - 7 x weekly - 2 sets - 10 reps - 5 seconds hold  ASSESSMENT:  CLINICAL IMPRESSION: Pt seems to have responded well to the SI injection.  PT assessed HS flexibility and she has good HS flexibility.  PT established HEP and gave pt a HEP handout today.  Pt performed exercises well  with cuing and instruction in correct form.  She verbalized good understanding of HEP.  PT performed STM to bilat glutes to improve soft tissue tightness and mobility and pain.  She has tenderness with palpation in bilat glutes.  Pt responded well to Rx reporting a 1/10 pain after Rx.   Pt should benefit from continued skilled PT services to address impairments and goals and improve overall function.      OBJECTIVE IMPAIRMENTS: decreased endurance, decreased strength, postural dysfunction, and pain.   ACTIVITY LIMITATIONS: standing and sleeping  PARTICIPATION LIMITATIONS:   PERSONAL FACTORS: 1-2 comorbidities: osteoporosis, scoliosis  are also affecting patient's functional outcome.   REHAB POTENTIAL: Good  CLINICAL DECISION MAKING: Stable/uncomplicated  EVALUATION COMPLEXITY: Low   GOALS:  SHORT TERM GOALS: Target date: 07/09/2023   Pt will be independent and compliant with HEP for improved pain, strength, and function.  Baseline: Goal status: INITIAL  2.  Pt will report at least a 25% improvement in pain and sx's overall.  Baseline:  Goal status: INITIAL  3.  Pt will report improved tolerance with standing including increased Wb'ing thru L LE and decreased compensation.  Baseline:  Goal status: INITIAL    LONG TERM GOALS: Target date: 07/30/2023  Pt will be able to sleep without pain disturbance.   Baseline:  Goal status: INITIAL  2.  Pt will report at least a 75% improvement in pain with prolonged standing.  Baseline:  Goal status: INITIAL  3.  Pt will demo improved bilat hip strength to 4+/5 in flexion and 5/5 in ER and L hip abd strength to 5/5 for improved performance of and tolerance with daily mobility.   Baseline:  Goal status: INITIAL  4.  Pt's worst pain will be no > 2/10 for improved tolerance with standing.   Baseline:  Goal status: INITIAL    PLAN:  PT FREQUENCY: 2x/week  PT DURATION: 6 weeks  PLANNED INTERVENTIONS: 97164- PT Re-evaluation,  97750- Physical Performance Testing, 97110-Therapeutic exercises, 97530- Therapeutic activity, [id number]- Neuromuscular re-education, 97535- Self Care, 95621- Manual therapy, [phone]- Gait training, [phone]- Aquatic Therapy, [phone]- Electrical stimulation (unattended), [phone]- Ultrasound, Patient/Family education, Balance training, Stair training, Taping, Dry Needling, Cryotherapy, and Moist heat.  PLAN FOR NEXT SESSION: core and hip strengthening.  Review HEP.  Cont with STM to glute. Pt states she is busy handling her mother's estate and would like to do PT once per week.    Trina Fujita III PT, DPT 06/23/23 5:34 PM

## 2023-06-23 NOTE — Telephone Encounter (Signed)
 See Prolia referral for March 2025 for details

## 2023-06-24 ENCOUNTER — Ambulatory Visit

## 2023-06-29 ENCOUNTER — Ambulatory Visit: Admitting: Physician Assistant

## 2023-06-29 DIAGNOSIS — M810 Age-related osteoporosis without current pathological fracture: Secondary | ICD-10-CM

## 2023-06-29 MED ORDER — ROMOSOZUMAB-AQQG 105 MG/1.17ML ~~LOC~~ SOSY: 210.0000 mg | PREFILLED_SYRINGE | SUBCUTANEOUS | Status: AC

## 2023-06-29 NOTE — Progress Notes (Signed)
 Patient was in today and had her Evenity  injections.  One in each arm.  She tolerated the injection well.  She sat for 5 minutes after the injection to make sure there was no side affects

## 2023-06-29 NOTE — Patient Instructions (Signed)
 Patients next injection is in 31 days with me on nurse visit

## 2023-07-02 ENCOUNTER — Ambulatory Visit (HOSPITAL_BASED_OUTPATIENT_CLINIC_OR_DEPARTMENT_OTHER): Payer: Self-pay

## 2023-07-02 ENCOUNTER — Encounter (HOSPITAL_BASED_OUTPATIENT_CLINIC_OR_DEPARTMENT_OTHER): Payer: Self-pay

## 2023-07-02 DIAGNOSIS — M6281 Muscle weakness (generalized): Secondary | ICD-10-CM

## 2023-07-02 DIAGNOSIS — M25552 Pain in left hip: Secondary | ICD-10-CM

## 2023-07-02 NOTE — Therapy (Signed)
 OUTPATIENT PHYSICAL THERAPY THORACOLUMBAR TREATMENT   Patient Name: [name] MRN: [id number] DOB:1956/01/03, 68 y.o., female Today's Date: 07/02/2023  END OF SESSION:  PT End of Session - 07/02/23 0931     Visit Number 3    Number of Visits 12    Date for PT Re-Evaluation 07/30/23    Authorization Type HTA    PT Start Time 0931    PT Stop Time 1016    PT Time Calculation (min) 45 min    Activity Tolerance Patient tolerated treatment well    Behavior During Therapy WFL for tasks assessed/performed               Past Medical History:  Diagnosis Date   Anxiety    very high   GERD (gastroesophageal reflux disease)    Glaucoma    Heart murmur    Pt saw Cardiologist when she was in her 54s, not since.   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   Osteoporosis    Scoliosis    Past Surgical History:  Procedure Laterality Date   CESAREAN SECTION  1988   SHOULDER ARTHROSCOPY WITH SUBACROMIAL DECOMPRESSION AND OPEN ROTATOR C Right 12/09/2013   Procedure: RIGHT SHOULDER ARTHROSCOPY WITH SUBACROMIAL DECOMPRESSION AND MINI OPEN ROTATOR CUFF REPAIR, AND BICEPS TENDODESIS;  Surgeon: Lorriane Rote, MD;  Location: [facility] OR;  Service: Orthopedics;  Laterality: Right;   TONSILLECTOMY     Patient Active Problem List   Diagnosis Date Noted   Hyperlipidemia 03/25/2022   Herpes labialis 02/16/2021   Gastroesophageal reflux disease without esophagitis 02/15/2018   Lactose intolerance 02/15/2018   History of vitamin D  deficiency 02/15/2018   Age-related osteoporosis without current pathological fracture 02/15/2018   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   Spondylosis 05/26/2007   Disorder of gallbladder 03/11/2007    PCP: Watson Hacking, MD  REFERRING PROVIDER: Wilhelmenia Harada, MD  REFERRING DIAG: [phone] (ICD-10-CM) - Chronic left SI joint pain  Rationale for Evaluation and Treatment: Rehabilitation  THERAPY DIAG:   Pain in left hip  Muscle weakness (generalized)  ONSET DATE: February 2025  SUBJECTIVE:                                                                                                                                                                                           SUBJECTIVE STATEMENT: "The pain is not so much of an issue now. I need to learn core exercises to help me with my posture." No pain in L hip since injection. Has been HEP compliant without issue.  PERTINENT HISTORY:  Osteoporosis history of scoliosis with S curve and levoscoliosis at lumbar R shoulder RCR 2015 R wrist and sternal fx from fall in December    PAIN:  Are you having pain? Yes NPRS:  0-1/10 current, 6/10 worst, 0/10 best Location:  posterolateral L hip, glute, central lumbar Aggravating factors:  prolonged standing, sleeping Easing factors:  sitting, taking weight off of LE  PRECAUTIONS: Other: osteoporosis    WEIGHT BEARING RESTRICTIONS: No  FALLS:  Has patient fallen in last 6 months? Yes. Number of falls 1 fall in December  LIVING ENVIRONMENT: Lives with: lives with their spouse Lives in: 2 story home Stairs: yes  OCCUPATION:  Pt is retired  PLOF: Independent  PATIENT GOALS: improve core strength, to know better positions for posture, improve posture, reduce pain   OBJECTIVE:  Note: Objective measures were completed at Evaluation unless otherwise noted.  DIAGNOSTIC FINDINGS:  X rays on 05/21/23:  (per Epic) FINDINGS: There is no evidence of hip fracture or dislocation. There is no evidence of arthropathy or other focal bone abnormality.   IMPRESSION: Negative.  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   TREATMENT:                                                                                                                                4/24 Piriformis stretching 30sec x2 bil TrA iso 5" x15 Hooklying marches with TrA  x20 DKTC x30 seconds Sidelying [place] 2x10 Supine SLR with TrA 2x10 Prone hip extension x8 (trialled, but too much lumbar activation) Standing hip extension x10ea Standing hip abduction 2x10ea Standing lateral flexion with 10lb KB for oblique strengthening Lateral step ups with cues for glute activation 2x10ea 6" step at railing    PATIENT EDUCATION:  Education details: dx, POC, relevant anatomy, exercise form, HEP, rationale of interventions, and what to expect next Rx.  PT answered pt's questions.  Person educated: Patient Education method: Explanation, demonstration, verbal and tactile cues, handout Education comprehension: verbalized understanding, returned demonstration, verbal and tactile cues required  HOME EXERCISE PROGRAM: Access Code: [email or web address] URL: [email or web address] Date: 06/23/2023 Prepared by: Marnie Siren  Exercises - Hooklying Isometric Hip Abduction with Belt  - 1 x daily - 7 x weekly - 2 sets - 10 reps - 5 seconds hold - Supine Hip Adduction Isometric with Ball  - 1 x daily - 7 x weekly - 2 sets - 10 reps - Supine Transversus Abdominis Bracing - Hands on Stomach  - 2 x daily - 7 x weekly - 2 sets - 10 reps - 5 seconds hold  ASSESSMENT:  CLINICAL IMPRESSION: Greater tightness noted in R hip with piriformis stretch today compared to L hip. Progressed with TrA focused activities as well as bilateral hip strengthening. Pt had difficulty with glute isolation from lumbar extensors with prone hip extension. Mild improvement in standing with this, though still challenging. Pt denied pain  with any task. Updated HEP to include progressions, with instructions to avoiding pushing past pain limits. She may also space out exercise due to quantity.   OBJECTIVE IMPAIRMENTS: decreased endurance, decreased strength, postural dysfunction, and pain.   ACTIVITY LIMITATIONS: standing and sleeping  PARTICIPATION LIMITATIONS:   PERSONAL FACTORS: 1-2  comorbidities: osteoporosis, scoliosis  are also affecting patient's functional outcome.   REHAB POTENTIAL: Good  CLINICAL DECISION MAKING: Stable/uncomplicated  EVALUATION COMPLEXITY: Low   GOALS:  SHORT TERM GOALS: Target date: 07/09/2023   Pt will be independent and compliant with HEP for improved pain, strength, and function.  Baseline: Goal status: IN PROGRESS 4/24  2.  Pt will report at least a 25% improvement in pain and sx's overall.  Baseline:  Goal status: MET 4/24  3.  Pt will report improved tolerance with standing including increased Wb'ing thru L LE and decreased compensation.  Baseline:  Goal status: INITIAL    LONG TERM GOALS: Target date: 07/30/2023  Pt will be able to sleep without pain disturbance.   Baseline:  Goal status: INITIAL  2.  Pt will report at least a 75% improvement in pain with prolonged standing.  Baseline:  Goal status: INITIAL  3.  Pt will demo improved bilat hip strength to 4+/5 in flexion and 5/5 in ER and L hip abd strength to 5/5 for improved performance of and tolerance with daily mobility.   Baseline:  Goal status: INITIAL  4.  Pt's worst pain will be no > 2/10 for improved tolerance with standing.   Baseline:  Goal status: INITIAL    PLAN:  PT FREQUENCY: 2x/week  PT DURATION: 6 weeks  PLANNED INTERVENTIONS: 97164- PT Re-evaluation, 97750- Physical Performance Testing, 97110-Therapeutic exercises, 97530- Therapeutic activity, [id number]- Neuromuscular re-education, 97535- Self Care, 16109- Manual therapy, [phone]- Gait training, [phone]- Aquatic Therapy, [phone]- Electrical stimulation (unattended), [phone]- Ultrasound, Patient/Family education, Balance training, Stair training, Taping, Dry Needling, Cryotherapy, and Moist heat.  PLAN FOR NEXT SESSION: core and hip strengthening.  Review HEP.  Cont with STM to glute. Pt states she is busy handling her mother's estate and would like to do PT once per week.    Herb Loges,  PTA  07/02/23 10:54 AM

## 2023-07-07 ENCOUNTER — Encounter (HOSPITAL_BASED_OUTPATIENT_CLINIC_OR_DEPARTMENT_OTHER): Payer: Self-pay | Admitting: Physical Therapy

## 2023-07-09 ENCOUNTER — Ambulatory Visit (HOSPITAL_BASED_OUTPATIENT_CLINIC_OR_DEPARTMENT_OTHER): Attending: Orthopaedic Surgery | Admitting: Physical Therapy

## 2023-07-09 ENCOUNTER — Encounter (HOSPITAL_BASED_OUTPATIENT_CLINIC_OR_DEPARTMENT_OTHER): Payer: Self-pay | Admitting: Physical Therapy

## 2023-07-09 DIAGNOSIS — M6281 Muscle weakness (generalized): Secondary | ICD-10-CM | POA: Insufficient documentation

## 2023-07-09 DIAGNOSIS — M25552 Pain in left hip: Secondary | ICD-10-CM | POA: Insufficient documentation

## 2023-07-09 NOTE — Therapy (Signed)
 OUTPATIENT PHYSICAL THERAPY THORACOLUMBAR TREATMENT   Patient Name: [name] MRN: [id number] DOB:[birth year], 68 y.o., female Today's Date: 07/09/2023  END OF SESSION:  PT End of Session - 07/09/23 1316     Visit Number 4    Number of Visits 12    Date for PT Re-Evaluation 07/30/23    Authorization Type HTA    PT Start Time 1317    PT Stop Time 1355    PT Time Calculation (min) 38 min    Behavior During Therapy WFL for tasks assessed/performed               Past Medical History:  Diagnosis Date   Anxiety    very high   GERD (gastroesophageal reflux disease)    Glaucoma    Heart murmur    Pt saw Cardiologist when she was in her 33s, not since.   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   Osteoporosis    Scoliosis    Past Surgical History:  Procedure Laterality Date   CESAREAN SECTION  1988   SHOULDER ARTHROSCOPY WITH SUBACROMIAL DECOMPRESSION AND OPEN ROTATOR C Right 12/09/2013   Procedure: RIGHT SHOULDER ARTHROSCOPY WITH SUBACROMIAL DECOMPRESSION AND MINI OPEN ROTATOR CUFF REPAIR, AND BICEPS TENDODESIS;  Surgeon: Lorriane Rote, MD;  Location: [facility] OR;  Service: Orthopedics;  Laterality: Right;   TONSILLECTOMY     Patient Active Problem List   Diagnosis Date Noted   Hyperlipidemia 03/25/2022   Herpes labialis 02/16/2021   Gastroesophageal reflux disease without esophagitis 02/15/2018   Lactose intolerance 02/15/2018   History of vitamin D  deficiency 02/15/2018   Age-related osteoporosis without current pathological fracture 02/15/2018   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   Spondylosis 05/26/2007   Disorder of gallbladder 03/11/2007    PCP: Watson Hacking, MD  REFERRING PROVIDER: Wilhelmenia Harada, MD  REFERRING DIAG: [phone] (ICD-10-CM) - Chronic left SI joint pain  Rationale for Evaluation and Treatment: Rehabilitation  THERAPY DIAG:  Pain in left hip  Muscle weakness (generalized)  ONSET  DATE: February 2025  SUBJECTIVE:                                                                                                                                                                                           SUBJECTIVE STATEMENT: Pt visited with son over weekend and did a lot of standing/ walking.   Pain in L hip up to 6/10.   "I'd sit down every chance I got."   Has trip coming up in Sept (to [place]) that will require a lot  of walking.         PERTINENT HISTORY:  Osteoporosis history of scoliosis with S curve and levoscoliosis at lumbar R shoulder RCR 2015 R wrist and sternal fx from fall in December    PAIN:  Are you having pain? no NPRS:  0/10 current Location:  posterolateral L hip, glute, central lumbar Aggravating factors:  prolonged standing, sleeping Easing factors:  sitting, taking weight off of LE  PRECAUTIONS: Other: osteoporosis    WEIGHT BEARING RESTRICTIONS: No  FALLS:  Has patient fallen in last 6 months? Yes. Number of falls 1 fall in December  LIVING ENVIRONMENT: Lives with: lives with their spouse Lives in: 2 story home Stairs: yes  OCCUPATION:  Pt is retired  PLOF: Independent  PATIENT GOALS: improve core strength, to know better positions for posture, improve posture, reduce pain   OBJECTIVE:  Note: Objective measures were completed at Evaluation unless otherwise noted.  DIAGNOSTIC FINDINGS:  X rays on 05/21/23:  (per Epic) FINDINGS: There is no evidence of hip fracture or dislocation. There is no evidence of arthropathy or other focal bone abnormality.   IMPRESSION: Negative.  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   TREATMENT:                                                                                                                               07/09/23 -NuStep L5, UE/LE x 6 min for warm up - seated piriformis stretch, pulling knee towards opp shoulder 15sec, 2 reps each LE -  modified pigeon pose for piriformis stretch x 20s each - TrA set with press x 10s x 5 (pressing on desk) - STS with TrA set x 5, controlled descent - sidelying clams x 10 each side  - L hip abdct 2 x 10 - updated HEP  4/24 Piriformis stretching 30sec x2 bil TrA iso 5" x15 Hooklying marches with TrA x20 DKTC x30 seconds Sidelying clams Bridge 2x10 Supine SLR with TrA 2x10 Prone hip extension x8 (trialled, but too much lumbar activation) Standing hip extension x10ea Standing hip abduction 2x10ea Standing lateral flexion with 10lb KB for oblique strengthening Lateral step ups with cues for glute activation 2x10ea 6" step at railing    PATIENT EDUCATION:  Education details:  HEP  Person educated: Patient Education method: Explanation, demonstration, verbal and tactile cues, handout Education comprehension: verbalized understanding, returned demonstration, verbal and tactile cues required  HOME EXERCISE PROGRAM: Access Code: [email or web address] URL: [email or web address]   ASSESSMENT:  CLINICAL IMPRESSION: Pt tolerated exercises well, without report of pain, only fatigue. Updated HEP. She may also space out exercise due to quantity.  Goals are ongoing.    OBJECTIVE IMPAIRMENTS: decreased endurance, decreased strength, postural dysfunction, and pain.   ACTIVITY LIMITATIONS: standing and sleeping  PARTICIPATION LIMITATIONS:   PERSONAL FACTORS: 1-2 comorbidities: osteoporosis, scoliosis  are also affecting patient's functional outcome.   REHAB POTENTIAL: Good  CLINICAL DECISION MAKING: Stable/uncomplicated  EVALUATION COMPLEXITY: Low   GOALS:  SHORT TERM GOALS: Target date: 07/09/2023   Pt will be independent and compliant with HEP for improved pain, strength, and function.  Baseline: Goal status: IN PROGRESS 4/24  2.  Pt will report at least a 25% improvement in pain and sx's overall.  Baseline:  Goal status: MET 4/24  3.  Pt will report improved tolerance  with standing including increased Wb'ing thru L LE and decreased compensation.  Baseline:  Goal status: INITIAL    LONG TERM GOALS: Target date: 07/30/2023  Pt will be able to sleep without pain disturbance.   Baseline:  Goal status: INITIAL  2.  Pt will report at least a 75% improvement in pain with prolonged standing.  Baseline:  Goal status: INITIAL  3.  Pt will demo improved bilat hip strength to 4+/5 in flexion and 5/5 in ER and L hip abd strength to 5/5 for improved performance of and tolerance with daily mobility.   Baseline:  Goal status: INITIAL  4.  Pt's worst pain will be no > 2/10 for improved tolerance with standing.   Baseline:  Goal status: INITIAL    PLAN:  PT FREQUENCY: 2x/week  PT DURATION: 6 weeks  PLANNED INTERVENTIONS: 97164- PT Re-evaluation, 97750- Physical Performance Testing, 97110-Therapeutic exercises, 97530- Therapeutic activity, [id number]- Neuromuscular re-education, 97535- Self Care, 16109- Manual therapy, [phone]- Gait training, [phone]- Aquatic Therapy, [phone]- Electrical stimulation (unattended), [phone]- Ultrasound, Patient/Family education, Balance training, Stair training, Taping, Dry Needling, Cryotherapy, and Moist heat.  PLAN FOR NEXT SESSION: core and hip strengthening.  Review HEP.  Cont with STM to glute. Pt states she is busy handling her mother's estate and would like to do PT once per week.   Almedia Jacobsen, PTA 07/09/23 6:10 PM [facility] [address] [place], [place], [address] Phone: [phone]   Fax:  [phone]

## 2023-07-14 ENCOUNTER — Encounter (HOSPITAL_BASED_OUTPATIENT_CLINIC_OR_DEPARTMENT_OTHER): Admitting: Physical Therapy

## 2023-07-16 ENCOUNTER — Encounter (HOSPITAL_BASED_OUTPATIENT_CLINIC_OR_DEPARTMENT_OTHER): Admitting: Physical Therapy

## 2023-07-21 ENCOUNTER — Encounter (HOSPITAL_BASED_OUTPATIENT_CLINIC_OR_DEPARTMENT_OTHER): Admitting: Physical Therapy

## 2023-07-23 ENCOUNTER — Encounter (HOSPITAL_BASED_OUTPATIENT_CLINIC_OR_DEPARTMENT_OTHER): Admitting: Physical Therapy

## 2023-07-23 ENCOUNTER — Ambulatory Visit (HOSPITAL_BASED_OUTPATIENT_CLINIC_OR_DEPARTMENT_OTHER): Admitting: Orthopaedic Surgery

## 2023-07-23 DIAGNOSIS — M67959 Unspecified disorder of synovium and tendon, unspecified thigh: Secondary | ICD-10-CM

## 2023-07-23 NOTE — Progress Notes (Signed)
 Chief Complaint: Left hip pain     History of Present Illness:   07/23/2023: Presents today for follow-up of the left hip.  [name] is a 68 y.o. female presents today with ongoing hip pain about the left SI joint.  She does have a known history of scoliosis.  She states that 1 month prior she was at a local fair and was on her feet quite significantly throughout the day.  She was standing on 1 leg and she was having persistent left-sided pain.  She does not experience any radiating symptoms    PMH/PSH/Family History/Social History/Meds/Allergies:    Past Medical History:  Diagnosis Date   Anxiety    very high   GERD (gastroesophageal reflux disease)    Glaucoma    Heart murmur    Pt saw Cardiologist when she was in her 65s, not since.   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   Osteoporosis    Scoliosis    Past Surgical History:  Procedure Laterality Date   CESAREAN SECTION  1988   SHOULDER ARTHROSCOPY WITH SUBACROMIAL DECOMPRESSION AND OPEN ROTATOR C Right 12/09/2013   Procedure: RIGHT SHOULDER ARTHROSCOPY WITH SUBACROMIAL DECOMPRESSION AND MINI OPEN ROTATOR CUFF REPAIR, AND BICEPS TENDODESIS;  Surgeon: Lorriane Rote, MD;  Location: [facility] OR;  Service: Orthopedics;  Laterality: Right;   TONSILLECTOMY     Social History   Socioeconomic History   Marital status: Married    Spouse name: Not on file   Number of children: 2   Years of education: Masters   Highest education level: Not on file  Occupational History   Occupation: Management/Librarian  Tobacco Use   Smoking status: Never   Smokeless tobacco: Never  Vaping Use   Vaping status: Never Used  Substance and Sexual Activity   Alcohol use: Yes    Comment: occ   Drug use: No   Sexual activity: Yes    Partners: Male    Birth control/protection: Post-menopausal    Comment: 1st intercourse- 23, partner- 1  Other Topics Concern   Not on file   Social History Narrative   Lives at home with her husband.   Right-handed.   6-8 cups caffeine per day.   Social Drivers of Corporate investment banker Strain: Low Risk  (06/02/2023)   Overall Financial Resource Strain (CARDIA)    Difficulty of Paying Living Expenses: Not hard at all  Food Insecurity: No Food Insecurity (06/02/2023)   Hunger Vital Sign    Worried About Running Out of Food in the Last Year: Never true    Ran Out of Food in the Last Year: Never true  Transportation Needs: No Transportation Needs (06/02/2023)   PRAPARE - Administrator, Civil Service (Medical): No    Lack of Transportation (Non-Medical): No  Physical Activity: Insufficiently Active (06/02/2023)   Exercise Vital Sign    Days of Exercise per Week: 2 days    Minutes of Exercise per Session: 20 min  Stress: Stress Concern Present (06/02/2023)   [employer] of Occupational Health - Occupational Stress Questionnaire    Feeling of Stress : Rather much  Social Connections: Moderately Integrated (06/02/2023)   Social Connection and Isolation Panel [NHANES]    Frequency of Communication with Friends and Family: More than three times a week    Frequency of Social Gatherings with Friends and Family: Once a week  Attends Religious Services: More than 4 times per year    Active Member of Clubs or Organizations: No    Attends Engineer, structural: Not on file    Marital Status: Married   Family History  Problem Relation Age of Onset   Arthritis Mother    Hyperlipidemia Mother    Atrial fibrillation Mother    Skin cancer Father    Depression Father    Parkinson's disease Father    CAD Father    Colon cancer Neg Hx    Colon polyps Neg Hx    Esophageal cancer Neg Hx    Stomach cancer Neg Hx    Rectal cancer Neg Hx    No Known Allergies Current Outpatient Medications  Medication Sig Dispense Refill   aspirin EC 81 MG tablet Take 81 mg by mouth daily.     Cholecalciferol (VITAMIN  D) 2000 UNITS CAPS Take 2,000 Units by mouth daily.     cycloSPORINE (RESTASIS) 0.05 % ophthalmic emulsion Place 1 drop into both eyes 2 (two) times daily.     Estradiol  (YUVAFEM ) 10 MCG TABS vaginal tablet PLACE 1 INSERT VAGINALLY TWICE WEEKLY ON TUESDAYS AND FRIDAYS 24 tablet 1   famotidine (PEPCID) 20 MG tablet Take 10 mg by mouth daily.      latanoprost (XALATAN) 0.005 % ophthalmic solution Place 1 drop into both eyes at bedtime.     Omega-3 Fatty Acids (FISH OIL PO) Take 1 capsule by mouth daily.     Current Facility-Administered Medications  Medication Dose Route Frequency Provider Last Rate Last Admin   [START ON 07/29/2023] Romosozumab -aqqg (EVENITY ) 105 MG/1. injection 210 mg  210 mg Subcutaneous Q30 days Persons, Norma Beckers, [place]       [ZOXWR ON 07/29/2023] Romosozumab -aqqg (EVENITY ) 105 MG/1. injection 210 mg  210 mg Subcutaneous Q30 days Persons, Norma Beckers, [place]       No results found.  Review of Systems:   A ROS was performed including pertinent positives and negatives as documented in the HPI.  Physical Exam :   Constitutional: NAD and appears stated age Neurological: Alert and oriented Psych: Appropriate affect and cooperative There were no vitals taken for this visit.   Comprehensive Musculoskeletal Exam:    Tenderness about the left SI joint with positive pain with compression of the iliac crest.  There is positive scoliosis about the lumbar spine.  Negative straight leg raise with distal neurosensory exam of the left.  She does have a positive Trendelenburg today with radiation to the lateral trochanter and isolated lateral trochanter pain.   Imaging:   Xray (4 views lumbar spine): Gliosis involving lumbar spine significantly and left SI arthritis    I personally reviewed and interpreted the radiographs.   Assessment and Plan:   68 y.o. 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  Given the fact that she has  now failed multiple injections as well as strengthening I would like to proceed with an MRI left hip and we will plan to see her back following   -Plan for MRI left hip and follow-up to discuss results   I personally saw and evaluated the patient, and participated in the management and treatment plan.  Wilhelmenia Harada, MD Attending Physician, Orthopedic Surgery  This document was dictated using Dragon voice recognition software. A reasonable attempt at proof reading has been made to minimize errors.

## 2023-07-28 ENCOUNTER — Encounter: Payer: Self-pay | Admitting: Cardiology

## 2023-07-28 ENCOUNTER — Encounter: Payer: Self-pay | Admitting: Family Medicine

## 2023-07-28 ENCOUNTER — Encounter (HOSPITAL_BASED_OUTPATIENT_CLINIC_OR_DEPARTMENT_OTHER): Admitting: Physical Therapy

## 2023-07-28 ENCOUNTER — Telehealth: Payer: Self-pay | Admitting: Cardiology

## 2023-07-28 ENCOUNTER — Telehealth: Payer: Self-pay | Admitting: *Deleted

## 2023-07-28 ENCOUNTER — Telehealth: Payer: Self-pay

## 2023-07-28 DIAGNOSIS — R059 Cough, unspecified: Secondary | ICD-10-CM | POA: Diagnosis not present

## 2023-07-28 DIAGNOSIS — R9389 Abnormal findings on diagnostic imaging of other specified body structures: Secondary | ICD-10-CM | POA: Diagnosis not present

## 2023-07-28 DIAGNOSIS — J209 Acute bronchitis, unspecified: Secondary | ICD-10-CM | POA: Diagnosis not present

## 2023-07-28 DIAGNOSIS — Z1152 Encounter for screening for COVID-19: Secondary | ICD-10-CM | POA: Diagnosis not present

## 2023-07-28 NOTE — Telephone Encounter (Signed)
   Copied from CRM [id number]. Topic: Clinical - Medical Advice >> Jul 28, 2023  3:03 PM Rosaria Common wrote: Reason for CRM: Pt went to ER for coughing. Chest xray found elongated abnormality and pt is concerned about it bursting and would like a callback.

## 2023-07-28 NOTE — Telephone Encounter (Signed)
 Copied from CRM [id number]. Topic: Clinical - Medical Advice >> Jul 28, 2023  3:03 PM Rosaria Common wrote: Reason for CRM: Pt went to ER for coughing. Chest xray found elongated abnormality and pt is concerned about it bursting and would like a callback.   Patient will be coming in to see J[facility] on Thursday 07/30/23.

## 2023-07-28 NOTE — Telephone Encounter (Signed)
 Pt is in [place] and pt had chest x-ray find Elongated aorta. They will fax the report to Dr Audery Blazing.

## 2023-07-29 ENCOUNTER — Telehealth: Payer: Self-pay | Admitting: Physician Assistant

## 2023-07-29 NOTE — Telephone Encounter (Signed)
 Patient called and said she had a chest xray and it became a problem and she thinks she dont need to get the shot tomorrow. CB#[phone]

## 2023-07-30 ENCOUNTER — Ambulatory Visit (INDEPENDENT_AMBULATORY_CARE_PROVIDER_SITE_OTHER): Admitting: Family Medicine

## 2023-07-30 ENCOUNTER — Ambulatory Visit

## 2023-07-30 ENCOUNTER — Ambulatory Visit (HOSPITAL_BASED_OUTPATIENT_CLINIC_OR_DEPARTMENT_OTHER): Admitting: Physical Therapy

## 2023-07-30 ENCOUNTER — Encounter: Payer: Self-pay | Admitting: Family Medicine

## 2023-07-30 VITALS — BP 122/80 | HR 85 | Wt 141.6 lb

## 2023-07-30 DIAGNOSIS — I771 Stricture of artery: Secondary | ICD-10-CM | POA: Diagnosis not present

## 2023-07-30 DIAGNOSIS — R9389 Abnormal findings on diagnostic imaging of other specified body structures: Secondary | ICD-10-CM

## 2023-07-30 NOTE — Progress Notes (Signed)
   Subjective:    Patient ID: Margerie K Minogue, female    DOB: [birth year], 68 y.o.   MRN: [id number]  HPI She is here for consult concerning abnormal chest x-ray.  She was in [place] and had difficulty with cough and congestion.  Went to emergency room and an x-ray apparently showed a tortuous thoracic aorta.  He was instructed to get this looked at expeditiously.  Presently she is having no chest pain, shortness of breath, dyspnea, weakness.   Review of Systems     Objective:    Physical Exam Alert and in no distress.  Cardiac exam shows regular rhythm without murmurs or gallops.  Lungs are clear to auscultation.  EKG read by me shows a rate of 78 with possible left atrial enlargement otherwise normal. An echo was ordered based on her history before she visited me today.      Assessment & Plan:  Abnormal chest x-ray - Plan: EKG 12-Lead  Tortuous aorta (HCC) - Plan: EKG 12-Lead Reassured her that I did not see anything of major concern and we will wait for the echocardiogram to decide what the next step would be.

## 2023-07-31 ENCOUNTER — Ambulatory Visit
Admission: RE | Admit: 2023-07-31 | Discharge: 2023-07-31 | Disposition: A | Discharge status: Home / Self Care | Source: Ambulatory Visit | Attending: Family Medicine | Admitting: Family Medicine

## 2023-07-31 ENCOUNTER — Ambulatory Visit
Admission: RE | Admit: 2023-07-31 | Discharge: 2023-07-31 | Disposition: A | Discharge status: Home / Self Care | Source: Ambulatory Visit | Attending: Orthopaedic Surgery | Admitting: Orthopaedic Surgery

## 2023-07-31 DIAGNOSIS — M67959 Unspecified disorder of synovium and tendon, unspecified thigh: Secondary | ICD-10-CM

## 2023-07-31 DIAGNOSIS — M25552 Pain in left hip: Secondary | ICD-10-CM | POA: Diagnosis not present

## 2023-07-31 DIAGNOSIS — R9389 Abnormal findings on diagnostic imaging of other specified body structures: Secondary | ICD-10-CM

## 2023-08-01 ENCOUNTER — Encounter: Payer: Self-pay | Admitting: Family Medicine

## 2023-08-02 ENCOUNTER — Emergency Department (HOSPITAL_COMMUNITY)
Admission: EM | Admit: 2023-08-02 | Discharge: 2023-08-02 | Disposition: A | Discharge status: Home / Self Care | Attending: Emergency Medicine | Admitting: Emergency Medicine

## 2023-08-02 ENCOUNTER — Ambulatory Visit: Payer: Self-pay | Admitting: Family Medicine

## 2023-08-02 ENCOUNTER — Emergency Department (HOSPITAL_COMMUNITY)

## 2023-08-02 ENCOUNTER — Other Ambulatory Visit: Payer: Self-pay

## 2023-08-02 DIAGNOSIS — Z7982 Long term (current) use of aspirin: Secondary | ICD-10-CM | POA: Insufficient documentation

## 2023-08-02 DIAGNOSIS — S2220XA Unspecified fracture of sternum, initial encounter for closed fracture: Secondary | ICD-10-CM | POA: Insufficient documentation

## 2023-08-02 DIAGNOSIS — R918 Other nonspecific abnormal finding of lung field: Secondary | ICD-10-CM | POA: Insufficient documentation

## 2023-08-02 DIAGNOSIS — S299XXA Unspecified injury of thorax, initial encounter: Secondary | ICD-10-CM | POA: Diagnosis present

## 2023-08-02 DIAGNOSIS — X58XXXA Exposure to other specified factors, initial encounter: Secondary | ICD-10-CM | POA: Diagnosis not present

## 2023-08-02 DIAGNOSIS — R9389 Abnormal findings on diagnostic imaging of other specified body structures: Secondary | ICD-10-CM

## 2023-08-02 DIAGNOSIS — K5730 Diverticulosis of large intestine without perforation or abscess without bleeding: Secondary | ICD-10-CM | POA: Diagnosis not present

## 2023-08-02 DIAGNOSIS — R0789 Other chest pain: Secondary | ICD-10-CM | POA: Diagnosis not present

## 2023-08-02 LAB — BASIC METABOLIC PANEL WITH GFR
Anion gap: 10 (ref 5–15)
BUN: 8 mg/dL (ref 8–23)
CO2: 26 mmol/L (ref 22–32)
Calcium: 8.9 mg/dL (ref 8.9–10.3)
Chloride: 104 mmol/L (ref 98–111)
Creatinine, Ser: 0.67 mg/dL (ref 0.44–1.00)
GFR, Estimated: >60 mL/min (ref >60)
Glucose, Bld: 110 mg/dL — ABNORMAL HIGH (ref 70–99)
Potassium: 3.5 mmol/L (ref 3.5–5.1)
Sodium: 140 mmol/L (ref 135–145)

## 2023-08-02 LAB — CBC
HCT: 48.6 % — ABNORMAL HIGH (ref 36.0–46.0)
Hemoglobin: 16.2 g/dL — ABNORMAL HIGH (ref 12.0–15.0)
MCH: 30.6 pg (ref 26.0–34.0)
MCHC: 33.3 g/dL (ref 30.0–36.0)
MCV: 91.9 fL (ref 80.0–100.0)
Platelets: 369 10*3/uL (ref 150–400)
RBC: 5.29 MIL/uL — ABNORMAL HIGH (ref 3.87–5.11)
RDW: 12.4 % (ref 11.5–15.5)
WBC: 7.3 10*3/uL (ref 4.0–10.5)
nRBC: 0 % (ref 0.0–0.2)

## 2023-08-02 MED ORDER — IOPAMIDOL (ISOVUE-370) INJECTION 76%
100.0000 mL | Freq: Once | INTRAVENOUS | Status: AC | PRN
  Administered 2023-08-02: 100 mL via INTRAVENOUS

## 2023-08-02 NOTE — Discharge Instructions (Addendum)
 No evidence of thoracic or abdominal aneurysm.  Your sternal fracture has not completely healed from your fall in December.

## 2023-08-02 NOTE — ED Notes (Signed)
 Went to CT

## 2023-08-02 NOTE — ED Triage Notes (Signed)
 Pt was seen for cough in [place]. Had CXRAY that showed abnormal aorta. Was told she was at risk for aortic aneurysm. Pt is here for scan. Went to pcp for US . US  showed no aneurysm but did not measure thoracic aorta. Pt is here for scan. Denies CP SOB.

## 2023-08-02 NOTE — ED Notes (Addendum)
 [name]

## 2023-08-02 NOTE — ED Provider Notes (Signed)
 [place] EMERGENCY DEPARTMENT AT [facility] Provider Note   CSN: [id number] Arrival date & time: 08/02/23  4098     History  Chief Complaint  Patient presents with   Follow-up    [name] is a 68 y.o. female.  Pt is a 68 yo female with pmhx significant for anxiety, GERD, IBS, scoliosis, and glaucoma.  Pt went to the hospital in [place] on 5/20 for a cough.  She was told she had an elongated and tortuous aorta and that she is at risk for sudden death.  She did call her PCP who ordered an US  of her abdominal aorta.  However, the area in question was her thoracic aorta.  Pt is concerned her aorta is going to rupture and is very anxious about it.  She is here to see if we can image this area.  No CP or back pain.       Home Medications Prior to Admission medications   Medication Sig Start Date End Date Taking? Authorizing Provider  aspirin EC 81 MG tablet Take 81 mg by mouth daily.    [provider]  Cholecalciferol (VITAMIN D ) 2000 UNITS CAPS Take 2,000 Units by mouth daily.    [provider]  cycloSPORINE (RESTASIS) 0.05 % ophthalmic emulsion Place 1 drop into both eyes 2 (two) times daily.    [provider]  Estradiol  (YUVAFEM ) 10 MCG TABS vaginal tablet PLACE 1 INSERT VAGINALLY TWICE WEEKLY ON TUESDAYS AND FRIDAYS 03/27/23   Reinaldo Caras, MD  famotidine (PEPCID) 20 MG tablet Take 10 mg by mouth daily.     [provider]  latanoprost (XALATAN) 0.005 % ophthalmic solution Place 1 drop into both eyes at bedtime.    [provider]  Omega-3 Fatty Acids (FISH OIL PO) Take 1 capsule by mouth daily.    [provider]      Allergies    Patient has no known allergies.    Review of Systems   Review of Systems  All other systems reviewed and are negative.   Physical Exam Updated Vital Signs BP 123/78   Pulse 81   Temp 98.2 F (36.8 C)   Resp 14   Ht 5\' 3"  (1.6 m)   Wt 63.5 kg   LMP  (LMP  Unknown) Comment: sexually active  SpO2 100%   BMI 24.80 kg/m  Physical Exam Vitals and nursing note reviewed.  Constitutional:      Appearance: Normal appearance.  HENT:     Head: Normocephalic and atraumatic.     Right Ear: External ear normal.     Left Ear: External ear normal.     Nose: Nose normal.     Mouth/Throat:     Mouth: Mucous membranes are moist.     Pharynx: Oropharynx is clear.  Eyes:     Extraocular Movements: Extraocular movements intact.     Conjunctiva/sclera: Conjunctivae normal.     Pupils: Pupils are equal, round, and reactive to light.  Cardiovascular:     Rate and Rhythm: Normal rate and regular rhythm.     Pulses: Normal pulses.     Heart sounds: Normal heart sounds.  Pulmonary:     Effort: Pulmonary effort is normal.     Breath sounds: Normal breath sounds.  Abdominal:     General: Abdomen is flat. Bowel sounds are normal.     Palpations: Abdomen is soft.  Musculoskeletal:        General: Normal range of  motion.     Cervical back: Normal range of motion and neck supple.  Skin:    General: Skin is warm.     Capillary Refill: Capillary refill takes less than 2 seconds.  Neurological:     General: No focal deficit present.     Mental Status: She is alert and oriented to person, place, and time.  Psychiatric:        Mood and Affect: Mood normal.        Behavior: Behavior normal.     ED Results / Procedures / Treatments   Labs (all labs ordered are listed, but only abnormal results are displayed) Labs Reviewed  BASIC METABOLIC PANEL WITH GFR - Abnormal; Notable for the following components:      Result Value   Glucose, Bld 110 (*)    All other components within normal limits  CBC - Abnormal; Notable for the following components:   RBC 5.29 (*)    Hemoglobin 16.2 (*)    HCT 48.6 (*)    All other components within normal limits    EKG EKG Interpretation Date/Time:  Sunday Aug 02 2023 10:00:40 EDT Ventricular Rate:  74 PR  Interval:  150 QRS Duration:  91 QT Interval:  378 QTC Calculation: 420 R Axis:   46  Text Interpretation: Sinus rhythm Probable left atrial enlargement No significant change since last tracing Confirmed by Sueellen Emery [phone]) on 08/02/2023 11:44:57 AM  Radiology CT Angio Chest/Abd/Pel for Dissection W and/or Wo Contrast Addendum Date: 08/02/2023 ADDENDUM REPORT: 08/02/2023 11:51 ADDENDUM: These results were called by telephone at the time of interpretation on 08/02/2023 at 11:51 am to provider Yesenia Locurto , who verbally acknowledged these results. Electronically Signed   By: Reagan Camera M.D.   On: 08/02/2023 11:51   Result Date: 08/02/2023 CLINICAL DATA:  Acute aortic syndrome EXAM: CT ANGIOGRAPHY CHEST, ABDOMEN AND PELVIS TECHNIQUE: Non-contrast CT of the chest was initially obtained. Multidetector CT imaging through the chest, abdomen and pelvis was performed using the standard protocol during bolus administration of intravenous contrast. Multiplanar reconstructed images and MIPs were obtained and reviewed to evaluate the vascular anatomy. RADIATION DOSE REDUCTION: This exam was performed according to the departmental dose-optimization program which includes automated exposure control, adjustment of the mA and/or kV according to patient size and/or use of iterative reconstruction technique. CONTRAST:  ISOVUE -370 IOPAMIDOL  (ISOVUE -370) INJECTION 76% COMPARISON:  CT of the chest performed February 17, 2023 FINDINGS: CTA CHEST FINDINGS Cardiovascular: Motion related changes in the aortic root. The tubular ascending thoracic aorta measures 31 mm in diameter. Three vessel aortic arch. Proximal arch vessels are patent. The descending thoracic aorta is patent without dissection or aneurysm. Mediastinum/Nodes: Nothing significant. Lungs/Pleura: No pleural effusion or pneumothorax. Small noncalcified pulmonary nodules are present. A 4 mm pulmonary nodule is present in the left lower lobe on image  124 series 8. Basilar scarring is also suspected. 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 Chronic compression deformity of T12, similar. Healed left third rib fracture, similar. Review of the MIP images confirms the above findings. CTA ABDOMEN AND PELVIS FINDINGS VASCULAR Aorta: Patent, no aneurysm. Celiac: Patent SMA: Patent Renals: Patent IMA: Patent Inflow: Limited by motion, grossly patent. Veins: No obvious venous abnormality within the limitations of this arterial phase study. Review of the MIP images confirms the above findings. NON-VASCULAR Hepatobiliary: The low-attenuation density is present in the right liver measuring 1.9 cm, similar  when compared to the previous exam. Gallbladder is grossly unremarkable. A second peripheral low-attenuation density is present near the dome of the liver on image 115 of series 5 which is also similar when compared to the previous exam. Pancreas: Unremarkable. No pancreatic ductal dilatation or surrounding inflammatory changes. Spleen: Normal in size without focal abnormality. Adrenals/Urinary Tract: Adrenal glands are unremarkable. Kidneys are normal, without renal calculi, focal lesion, or hydronephrosis. Bladder is unremarkable. Stomach/Bowel: Stomach is within normal limits. Appendix appears normal. No evidence of bowel wall thickening, distention, or inflammatory changes. Scattered colonic diverticulosis. Lymphatic: Nothing significant. Reproductive: Uterus and bilateral adnexa are unremarkable. Other: Nothing significant. Musculoskeletal: Degenerative changes in the imaged osseous structures. Review of the MIP images confirms the above findings. IMPRESSION: 1. No thoracic aneurysm or dissection. 2. 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 This finding is new when compared to CT performed February 17, 2023. 3. Chronic T12 compression fracture.  Electronically Signed: By: Reagan Camera M.D. On: 08/02/2023 11:41    Procedures Procedures    Medications Ordered in ED Medications  iopamidol  (ISOVUE -370) 76 % injection 100 mL (100 mLs Intravenous Contrast Given 08/02/23 1113)    ED Course/ Medical Decision Making/ A&P                                 Medical Decision Making Amount and/or Complexity of Data Reviewed Labs: ordered. Radiology: ordered.  Risk Prescription drug management.   This patient presents to the ED for concern of abn CXR, this involves an extensive number of treatment options, and is a complaint that carries with it a high risk of complications and morbidity.  The differential diagnosis includes AAA, aortic dissection, over read on CXR   Co morbidities that complicate the patient evaluation  anxiety, GERD, IBS, scoliosis, and glaucoma   Additional history obtained:  Additional history obtained from epic chart review  Lab Tests:  I Ordered, and personally interpreted labs.  The pertinent results include:  cbc nl, bmp nl   Imaging Studies ordered:  I ordered imaging studies including ct chest  I independently visualized and interpreted imaging which showed  . No thoracic aneurysm or dissection.  2. 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 This  finding is new when compared to CT performed February 17, 2023.  3. Chronic T12 compression fracture.   I agree with the radiologist interpretation   Cardiac Monitoring:  The patient was maintained on a cardiac monitor.  I personally viewed and interpreted the cardiac monitored which showed an underlying rhythm of: nsr   Medicines ordered and prescription drug management:   I have reviewed the patients home medicines and have made adjustments as needed   Test Considered:  ct   Problem List / ED Course:  Abn CXR:  no evidence of thoracic aneurysm on CT.  Pt does have a sternal fx that has not completely healed from fall  in Dec.  Pt is aware.  Pt is stable for d/c.  Return if worse.    Reevaluation:  After the interventions noted above, I reevaluated the patient and found that they have :improved   Social Determinants of Health:  Lives at home   Dispostion:  After consideration of the diagnostic results and the patients response to treatment, I feel that the patent would benefit from discharge with outpatient f/u.          Final Clinical Impression(s) /  ED Diagnoses Final diagnoses:  Abnormal CXR  Closed fracture of sternum, unspecified portion of sternum, initial encounter    Rx / DC Orders ED Discharge Orders     None         Sueellen Emery, MD 08/02/23 1154

## 2023-08-04 ENCOUNTER — Telehealth: Payer: Self-pay

## 2023-08-04 ENCOUNTER — Ambulatory Visit: Admitting: Family Medicine

## 2023-08-04 ENCOUNTER — Encounter (HOSPITAL_BASED_OUTPATIENT_CLINIC_OR_DEPARTMENT_OTHER): Admitting: Physical Therapy

## 2023-08-04 NOTE — Telephone Encounter (Signed)
 Thank you :)

## 2023-08-04 NOTE — Telephone Encounter (Signed)
 Noted. Medication has been ordered

## 2023-08-04 NOTE — Telephone Encounter (Signed)
 Can you please order for Evenity  please? Thank you.

## 2023-08-06 ENCOUNTER — Encounter (HOSPITAL_BASED_OUTPATIENT_CLINIC_OR_DEPARTMENT_OTHER): Admitting: Physical Therapy

## 2023-08-06 ENCOUNTER — Ambulatory Visit (HOSPITAL_BASED_OUTPATIENT_CLINIC_OR_DEPARTMENT_OTHER): Admitting: Orthopaedic Surgery

## 2023-08-13 ENCOUNTER — Ambulatory Visit (HOSPITAL_BASED_OUTPATIENT_CLINIC_OR_DEPARTMENT_OTHER): Admitting: Orthopaedic Surgery

## 2023-08-13 DIAGNOSIS — M67959 Unspecified disorder of synovium and tendon, unspecified thigh: Secondary | ICD-10-CM | POA: Diagnosis not present

## 2023-08-13 NOTE — Progress Notes (Signed)
 Chief Complaint: Left hip pain     History of Present Illness:   08/13/2023: presents today for MRI discussion of the left hip.  She is continuing to have radiating symptoms down the left lower buttock.  [name] is a 68 y.o. female presents today with ongoing hip pain about the left SI joint.  She does have a known history of scoliosis.  She states that 1 month prior she was at a local fair and was on her feet quite significantly throughout the day.  She was standing on 1 leg and she was having persistent left-sided pain.  She does not experience any radiating symptoms    PMH/PSH/Family History/Social History/Meds/Allergies:    Past Medical History:  Diagnosis Date   Anxiety    very high   GERD (gastroesophageal reflux disease)    Glaucoma    Heart murmur    Pt saw Cardiologist when she was in her 46s, not since.   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   Osteoporosis    Scoliosis    Past Surgical History:  Procedure Laterality Date   CESAREAN SECTION  1988   SHOULDER ARTHROSCOPY WITH SUBACROMIAL DECOMPRESSION AND OPEN ROTATOR C Right 12/09/2013   Procedure: RIGHT SHOULDER ARTHROSCOPY WITH SUBACROMIAL DECOMPRESSION AND MINI OPEN ROTATOR CUFF REPAIR, AND BICEPS TENDODESIS;  Surgeon: Lorriane Rote, MD;  Location: [facility] OR;  Service: Orthopedics;  Laterality: Right;   TONSILLECTOMY     Social History   Socioeconomic History   Marital status: Married    Spouse name: Not on file   Number of children: 2   Years of education: Masters   Highest education level: Not on file  Occupational History   Occupation: Management/Librarian  Tobacco Use   Smoking status: Never   Smokeless tobacco: Never  Vaping Use   Vaping status: Never Used  Substance and Sexual Activity   Alcohol use: Yes    Comment: occ   Drug use: No   Sexual activity: Yes    Partners: Male    Birth control/protection: Post-menopausal     Comment: 1st intercourse- 23, partner- 1  Other Topics Concern   Not on file  Social History Narrative   Lives at home with her husband.   Right-handed.   6-8 cups caffeine per day.   Social Drivers of Corporate investment banker Strain: Low Risk  (06/02/2023)   Overall Financial Resource Strain (CARDIA)    Difficulty of Paying Living Expenses: Not hard at all  Food Insecurity: No Food Insecurity (06/02/2023)   Hunger Vital Sign    Worried About Running Out of Food in the Last Year: Never true    Ran Out of Food in the Last Year: Never true  Transportation Needs: No Transportation Needs (06/02/2023)   PRAPARE - Administrator, Civil Service (Medical): No    Lack of Transportation (Non-Medical): No  Physical Activity: Insufficiently Active (06/02/2023)   Exercise Vital Sign    Days of Exercise per Week: 2 days    Minutes of Exercise per Session: 20 min  Stress: Stress Concern Present (06/02/2023)   [employer] of Occupational Health - Occupational Stress Questionnaire    Feeling of Stress : Rather much  Social Connections: Moderately Integrated (06/02/2023)   Social Connection and Isolation Panel [NHANES]    Frequency of Communication with Friends and Family: More than three times a week  Frequency of Social Gatherings with Friends and Family: Once a week    Attends Religious Services: More than 4 times per year    Active Member of [employer] or Organizations: No    Attends Engineer, structural: Not on file    Marital Status: Married   Family History  Problem Relation Age of Onset   Arthritis Mother    Hyperlipidemia Mother    Atrial fibrillation Mother    Skin cancer Father    Depression Father    Parkinson's disease Father    CAD Father    Colon cancer Neg Hx    Colon polyps Neg Hx    Esophageal cancer Neg Hx    Stomach cancer Neg Hx    Rectal cancer Neg Hx    No Known Allergies Current Outpatient Medications  Medication Sig Dispense Refill    aspirin EC 81 MG tablet Take 81 mg by mouth daily.     Cholecalciferol (VITAMIN D ) 2000 UNITS CAPS Take 2,000 Units by mouth daily.     cycloSPORINE (RESTASIS) 0.05 % ophthalmic emulsion Place 1 drop into both eyes 2 (two) times daily.     Estradiol  (YUVAFEM ) 10 MCG TABS vaginal tablet PLACE 1 INSERT VAGINALLY TWICE WEEKLY ON TUESDAYS AND FRIDAYS 24 tablet 1   famotidine (PEPCID) 20 MG tablet Take 10 mg by mouth daily.      latanoprost (XALATAN) 0.005 % ophthalmic solution Place 1 drop into both eyes at bedtime.     Omega-3 Fatty Acids (FISH OIL PO) Take 1 capsule by mouth daily.     Current Facility-Administered Medications  Medication Dose Route Frequency Provider Last Rate Last Admin   Romosozumab -aqqg (EVENITY ) 105 MG/1. injection 210 mg  210 mg Subcutaneous Q30 days Persons, Norma Beckers, PA       Romosozumab -aqqg (EVENITY ) 105 MG/1. injection 210 mg  210 mg Subcutaneous Q30 days Persons, Norma Beckers, PA       No results found.  Review of Systems:   A ROS was performed including pertinent positives and negatives as documented in the HPI.  Physical Exam :   Constitutional: NAD and appears stated age Neurological: Alert and oriented Psych: Appropriate affect and cooperative There were no vitals taken for this visit.   Comprehensive Musculoskeletal Exam:    Tenderness about the left SI joint with positive pain with compression of the iliac crest.  There is positive scoliosis about the lumbar spine.  Negative straight leg raise with distal neurosensory exam of the left.  She does have a positive Trendelenburg today with radiation to the lateral trochanter and isolated lateral trochanter pain.  Positive straight leg maneuver on the left with radiating pain   Imaging:   Xray (4 views lumbar spine): scoliosis involving lumbar spine significantly and left SI arthritis  MRI left hip: Normal without evidenceof gluteus tendinopathy  I personally reviewed and interpreted the  radiographs.   Assessment and Plan:   68 y.o. female with left lateral based hippain.  I described that her MRI does appear to be negative to be a nice result we have essentially narrowed this down to a lumbar radiculopathy.  As a result the fact that she has extensively child therapy in the past however like to obtain an MRI of her lumbar spine and I'll see her back to discuss results  -For MRI left lumbar spine   -Plan for MRI left hip and follow-up to discuss results   I personally saw and evaluated the patient, and participated in  the management and treatment plan.  Wilhelmenia Harada, MD Attending Physician, Orthopedic Surgery  This document was dictated using Dragon voice recognition software. A reasonable attempt at proof reading has been made to minimize errors.

## 2023-08-14 ENCOUNTER — Ambulatory Visit: Admitting: Physician Assistant

## 2023-08-14 ENCOUNTER — Telehealth: Payer: Self-pay

## 2023-08-14 DIAGNOSIS — M810 Age-related osteoporosis without current pathological fracture: Secondary | ICD-10-CM | POA: Diagnosis not present

## 2023-08-14 MED ORDER — ROMOSOZUMAB-AQQG 105 MG/1.17ML ~~LOC~~ SOSY: 210.0000 mg | PREFILLED_SYRINGE | SUBCUTANEOUS | Status: AC

## 2023-08-14 NOTE — Telephone Encounter (Signed)
 Patient in today for her Evenity  injection.  She tolerated it well  and IM in the upper arm  will schedule for 30 days for next injection

## 2023-08-18 ENCOUNTER — Ambulatory Visit
Admission: RE | Admit: 2023-08-18 | Discharge: 2023-08-18 | Disposition: A | Discharge status: Home / Self Care | Source: Ambulatory Visit | Attending: Orthopaedic Surgery | Admitting: Orthopaedic Surgery

## 2023-08-18 ENCOUNTER — Telehealth (HOSPITAL_BASED_OUTPATIENT_CLINIC_OR_DEPARTMENT_OTHER): Payer: Self-pay | Admitting: Orthopaedic Surgery

## 2023-08-18 ENCOUNTER — Other Ambulatory Visit (HOSPITAL_BASED_OUTPATIENT_CLINIC_OR_DEPARTMENT_OTHER): Payer: Self-pay | Admitting: Orthopaedic Surgery

## 2023-08-18 DIAGNOSIS — G8929 Other chronic pain: Secondary | ICD-10-CM

## 2023-08-18 DIAGNOSIS — M5126 Other intervertebral disc displacement, lumbar region: Secondary | ICD-10-CM | POA: Diagnosis not present

## 2023-08-18 DIAGNOSIS — M47816 Spondylosis without myelopathy or radiculopathy, lumbar region: Secondary | ICD-10-CM | POA: Diagnosis not present

## 2023-08-18 NOTE — Telephone Encounter (Signed)
Referral placed for lumbar spine.

## 2023-08-18 NOTE — Telephone Encounter (Signed)
 Patient wants to know if her MRI referral was sent over

## 2023-08-31 ENCOUNTER — Ambulatory Visit

## 2023-09-03 ENCOUNTER — Ambulatory Visit (HOSPITAL_BASED_OUTPATIENT_CLINIC_OR_DEPARTMENT_OTHER): Admitting: Orthopaedic Surgery

## 2023-09-03 DIAGNOSIS — M5116 Intervertebral disc disorders with radiculopathy, lumbar region: Secondary | ICD-10-CM

## 2023-09-03 NOTE — Progress Notes (Signed)
 Chief Complaint: Left hip pain     History of Present Illness:   09/03/2023: Presents today for follow-up of her left back and hip pain.  She is here today for MRI discussion  [name] is a 68 y.o. female presents today with ongoing hip pain about the left SI joint.  She does have a known history of scoliosis.  She states that 1 month prior she was at a local fair and was on her feet quite significantly throughout the day.  She was standing on 1 leg and she was having persistent left-sided pain.  She does not experience any radiating symptoms    PMH/PSH/Family History/Social History/Meds/Allergies:    Past Medical History:  Diagnosis Date   Anxiety    very high   GERD (gastroesophageal reflux disease)    Glaucoma    Heart murmur    Pt saw Cardiologist when she was in her 28s, not since.   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   Osteoporosis    Scoliosis    Past Surgical History:  Procedure Laterality Date   CESAREAN SECTION  1988   SHOULDER ARTHROSCOPY WITH SUBACROMIAL DECOMPRESSION AND OPEN ROTATOR C Right 12/09/2013   Procedure: RIGHT SHOULDER ARTHROSCOPY WITH SUBACROMIAL DECOMPRESSION AND MINI OPEN ROTATOR CUFF REPAIR, AND BICEPS TENDODESIS;  Surgeon: Elspeth JONELLE Her, MD;  Location: [facility] OR;  Service: Orthopedics;  Laterality: Right;   TONSILLECTOMY     Social History   Socioeconomic History   Marital status: Married    Spouse name: Not on file   Number of children: 2   Years of education: Masters   Highest education level: Not on file  Occupational History   Occupation: Management/Librarian  Tobacco Use   Smoking status: Never   Smokeless tobacco: Never  Vaping Use   Vaping status: Never Used  Substance and Sexual Activity   Alcohol use: Yes    Comment: occ   Drug use: No   Sexual activity: Yes    Partners: Male    Birth control/protection: Post-menopausal    Comment: 1st intercourse- 23,  partner- 1  Other Topics Concern   Not on file  Social History Narrative   Lives at home with her husband.   Right-handed.   6-8 cups caffeine per day.   Social Drivers of Corporate investment banker Strain: Low Risk  (06/02/2023)   Overall Financial Resource Strain (CARDIA)    Difficulty of Paying Living Expenses: Not hard at all  Food Insecurity: No Food Insecurity (06/02/2023)   Hunger Vital Sign    Worried About Running Out of Food in the Last Year: Never true    Ran Out of Food in the Last Year: Never true  Transportation Needs: No Transportation Needs (06/02/2023)   PRAPARE - Administrator, Civil Service (Medical): No    Lack of Transportation (Non-Medical): No  Physical Activity: Insufficiently Active (06/02/2023)   Exercise Vital Sign    Days of Exercise per Week: 2 days    Minutes of Exercise per Session: 20 min  Stress: Stress Concern Present (06/02/2023)   [employer] of Occupational Health - Occupational Stress Questionnaire    Feeling of Stress : Rather much  Social Connections: Moderately Integrated (06/02/2023)   Social Connection and Isolation Panel    Frequency of Communication with Friends and Family: More than three times a week    Frequency  of Social Gatherings with Friends and Family: Once a week    Attends Religious Services: More than 4 times per year    Active Member of [employer] or Organizations: No    Attends Engineer, structural: Not on file    Marital Status: Married   Family History  Problem Relation Age of Onset   Arthritis Mother    Hyperlipidemia Mother    Atrial fibrillation Mother    Skin cancer Father    Depression Father    Parkinson's disease Father    CAD Father    Colon cancer Neg Hx    Colon polyps Neg Hx    Esophageal cancer Neg Hx    Stomach cancer Neg Hx    Rectal cancer Neg Hx    No Known Allergies Current Outpatient Medications  Medication Sig Dispense Refill   aspirin EC 81 MG tablet Take 81 mg by  mouth daily.     Cholecalciferol (VITAMIN D ) 2000 UNITS CAPS Take 2,000 Units by mouth daily.     cycloSPORINE (RESTASIS) 0.05 % ophthalmic emulsion Place 1 drop into both eyes 2 (two) times daily.     Estradiol  (YUVAFEM ) 10 MCG TABS vaginal tablet PLACE 1 INSERT VAGINALLY TWICE WEEKLY ON TUESDAYS AND FRIDAYS 24 tablet 1   famotidine (PEPCID) 20 MG tablet Take 10 mg by mouth daily.      latanoprost (XALATAN) 0.005 % ophthalmic solution Place 1 drop into both eyes at bedtime.     Omega-3 Fatty Acids (FISH OIL PO) Take 1 capsule by mouth daily.     Current Facility-Administered Medications  Medication Dose Route Frequency Provider Last Rate Last Admin   [START ON 09/13/2023] Romosozumab -aqqg (EVENITY ) 105 MG/1. injection 210 mg  210 mg Subcutaneous Q30 days Persons, Ronal Dragon, PA       No results found.  Review of Systems:   A ROS was performed including pertinent positives and negatives as documented in the HPI.  Physical Exam :   Constitutional: NAD and appears stated age Neurological: Alert and oriented Psych: Appropriate affect and cooperative There were no vitals taken for this visit.   Comprehensive Musculoskeletal Exam:    Tenderness about the left SI joint with positive pain with compression of the iliac crest.  There is positive scoliosis about the lumbar spine.  Negative straight leg raise with distal neurosensory exam of the left.  She does have a positive Trendelenburg today with radiation to the lateral trochanter and isolated lateral trochanter pain.  Positive straight leg maneuver on the left with radiating pain   Imaging:   Xray (4 views lumbar spine): scoliosis involving lumbar spine significantly and left SI arthritis  MRI left hip, lumbar spine: Normal without evidenceof gluteus tendinopathy, L4-L5 lumbar disc collapse with evidence of neuroforaminal impingement on the left with T12 compression fracture  I personally reviewed and interpreted the  radiographs.   Assessment and Plan:   68 y.o. female with radiating left lateral hip pain consistent with lumbar radiculopathy in the setting of L4-L5 disc base collapse and left-sided neuroforaminal impingement.  She does also have a known T12 compression fracture as well which causes some radicular numbness on the left side.  Given this I would like to plan for referral to Dr. Eldonna for an epidural injection particularly as she has an upcoming trip planned in September.  I would like to place a referral to Dr. Georgina to get her on his radar and to discuss possible surgical intervention.  She will also  begin physical therapy for strengthening of her core and back  - Turn to clinic as needed  I personally saw and evaluated the patient, and participated in the management and treatment plan.  Elspeth Parker, MD Attending Physician, Orthopedic Surgery  This document was dictated using Dragon voice recognition software. A reasonable attempt at proof reading has been made to minimize errors.

## 2023-09-08 DIAGNOSIS — Z86018 Personal history of other benign neoplasm: Secondary | ICD-10-CM | POA: Diagnosis not present

## 2023-09-08 DIAGNOSIS — D225 Melanocytic nevi of trunk: Secondary | ICD-10-CM | POA: Diagnosis not present

## 2023-09-08 DIAGNOSIS — L578 Other skin changes due to chronic exposure to nonionizing radiation: Secondary | ICD-10-CM | POA: Diagnosis not present

## 2023-09-08 DIAGNOSIS — L821 Other seborrheic keratosis: Secondary | ICD-10-CM | POA: Diagnosis not present

## 2023-09-08 DIAGNOSIS — L814 Other melanin hyperpigmentation: Secondary | ICD-10-CM | POA: Diagnosis not present

## 2023-09-09 ENCOUNTER — Encounter: Payer: Self-pay | Admitting: Family Medicine

## 2023-09-09 ENCOUNTER — Ambulatory Visit: Payer: Self-pay

## 2023-09-09 NOTE — Telephone Encounter (Signed)
 FYI Only or Action Required?: Action required by provider: clinical question for provider and pain medication.  Patient was last seen in primary care on 07/30/2023 by Joyce Norleen BROCKS, MD. Called Nurse Triage reporting Hip Pain and Back Pain. Symptoms began several months ago. Interventions attempted: OTC medications: Tylenol , Rest, hydration, or home remedies, and Ice/heat application. Symptoms are: unchanged.  Triage Disposition: See HCP Within 4 Hours (Or PCP Triage)  Patient/caregiver understands and will follow disposition?: No, wishes to speak with PCP  Copied from CRM [id number]. Topic: Clinical - Red Word Triage >> Sep 09, 2023  8:37 AM Graeme ORN wrote: Red Word that prompted transfer to Nurse Triage: Hip and back pain- a lot of pain  - has appt with specialist on 15th Reason for Disposition  [1] SEVERE back pain (e.g., excruciating, unable to do any normal activities) AND [2] not improved 2 hours after pain medicine  [1] SEVERE pain (e.g., excruciating, unable to do any normal activities) AND [2] not improved after 2 hours of pain medicine  Answer Assessment - Initial Assessment Questions 1. LOCATION and RADIATION: Where is the pain located?      Left hip 2. QUALITY: What does the pain feel like?  (e.g., sharp, dull, aching, burning)     sharp 3. SEVERITY: How bad is the pain? What does it keep you from doing?   (Scale 1-10; or mild, moderate, severe)   -  MILD (1-3): doesn't interfere with normal activities    -  MODERATE (4-7): interferes with normal activities (e.g., work or school) or awakens from sleep, limping    -  SEVERE (8-10): excruciating pain, unable to do any normal activities, unable to walk     6 out of 10 4. ONSET: When did the pain start? Does it come and go, or is it there all the time?     A couple of months 5. WORK OR EXERCISE: Has there been any recent work or exercise that involved this part of the body?      no 6. CAUSE: What do you think is  causing the hip pain?      Patient recently seen by ortho specialist who found that she has nerve involvement that she is needing to see a neurologist.  7. AGGRAVATING FACTORS: What makes the hip pain worse? (e.g., walking, climbing stairs, running)     standing 8. OTHER SYMPTOMS: Do you have any other symptoms? (e.g., back pain, pain shooting down leg,  fever, rash)     Back pain  Answer Assessment - Initial Assessment Questions 1. ONSET: When did the pain begin?      Couple of months 2. LOCATION: Where does it hurt? (upper, mid or lower back)    Left  Lower back pain 3. SEVERITY: How bad is the pain?  (e.g., Scale 1-10; mild, moderate, or severe)   - MILD (1-3): Doesn't interfere with normal activities.    - MODERATE (4-7): Interferes with normal activities or awakens from sleep.    - SEVERE (8-10): Excruciating pain, unable to do any normal activities.      6 out of 10 4. PATTERN: Is the pain constant? (e.g., yes, no; constant, intermittent)      constant 5. RADIATION: Does the pain shoot into your legs or somewhere else?     no 6. CAUSE:  What do you think is causing the back pain?      Patient was told by ortho that she has nerve involvement that is causing  the pain 7. BACK OVERUSE:  Any recent lifting of heavy objects, strenuous work or exercise?     no 8. MEDICINES: What have you taken so far for the pain? (e.g., nothing, acetaminophen , NSAIDS)     tylenol  9. NEUROLOGIC SYMPTOMS: Do you have any weakness, numbness, or problems with bowel/bladder control?     no 10. OTHER SYMPTOMS: Do you have any other symptoms? (e.g., fever, abdomen pain, burning with urination, blood in urine)       No  Patient reports that she was seen by ortho on 6/26-per provider note, radiating left lateral hip pain consistent with lumbar radiculopathy in the setting of L4-L5 disc base collapse and left-sided neuroforaminal impingement.  She does also have a known T12 compression  fracture as well which causes some radicular numbness on the left side. Patient has an appointment with neurosurgery on 7/15-patient is asking if gabapentin  could be sent into the pharmacy to help with pain control. Patient states she has been taking tylenol  with little help in pain control. Patient is asking for a phone call back.  Protocols used: Hip Pain-A-AH, Back Pain-A-AH

## 2023-09-22 DIAGNOSIS — Z6825 Body mass index (BMI) 25.0-25.9, adult: Secondary | ICD-10-CM | POA: Diagnosis not present

## 2023-09-22 DIAGNOSIS — M5440 Lumbago with sciatica, unspecified side: Secondary | ICD-10-CM | POA: Diagnosis not present

## 2023-09-23 ENCOUNTER — Encounter: Payer: Self-pay | Admitting: Rehabilitative and Restorative Service Providers"

## 2023-09-23 ENCOUNTER — Telehealth: Payer: Self-pay

## 2023-09-23 ENCOUNTER — Ambulatory Visit: Admitting: Rehabilitative and Restorative Service Providers"

## 2023-09-23 DIAGNOSIS — M5416 Radiculopathy, lumbar region: Secondary | ICD-10-CM | POA: Diagnosis not present

## 2023-09-23 DIAGNOSIS — M6281 Muscle weakness (generalized): Secondary | ICD-10-CM

## 2023-09-23 DIAGNOSIS — R293 Abnormal posture: Secondary | ICD-10-CM

## 2023-09-23 DIAGNOSIS — R262 Difficulty in walking, not elsewhere classified: Secondary | ICD-10-CM

## 2023-09-23 NOTE — Therapy (Signed)
 OUTPATIENT PHYSICAL THERAPY THORACOLUMBAR EVALUATION   Patient Name: JHANVI DRAKEFORD MRN: [id number] DOB:1955/10/13, 68 y.o., female Today's Date: 09/23/2023  END OF SESSION:  PT End of Session - 09/23/23 1119     Visit Number 1    Number of Visits 16    Date for PT Re-Evaluation 11/18/23    Authorization - Number of Visits 16    Progress Note Due on Visit 10    PT Start Time 1019    PT Stop Time 1116    PT Time Calculation (min) 57 min    Activity Tolerance Patient tolerated treatment well;No increased pain    Behavior During Therapy WFL for tasks assessed/performed          Past Medical History:  Diagnosis Date   Anxiety    very high   GERD (gastroesophageal reflux disease)    Glaucoma    Heart murmur    Pt saw Cardiologist when she was in her 53s, not since.   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   Osteoporosis    Scoliosis    Past Surgical History:  Procedure Laterality Date   CESAREAN SECTION  1988   SHOULDER ARTHROSCOPY WITH SUBACROMIAL DECOMPRESSION AND OPEN ROTATOR C Right 12/09/2013   Procedure: RIGHT SHOULDER ARTHROSCOPY WITH SUBACROMIAL DECOMPRESSION AND MINI OPEN ROTATOR CUFF REPAIR, AND BICEPS TENDODESIS;  Surgeon: Elspeth JONELLE Her, MD;  Location: [facility] OR;  Service: Orthopedics;  Laterality: Right;   TONSILLECTOMY     Patient Active Problem List   Diagnosis Date Noted   Hyperlipidemia 03/25/2022   Herpes labialis 02/16/2021   Gastroesophageal reflux disease without esophagitis 02/15/2018   Lactose intolerance 02/15/2018   History of vitamin D  deficiency 02/15/2018   Age-related osteoporosis without current pathological fracture 02/15/2018   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   Spondylosis 05/26/2007   Disorder of gallbladder 03/11/2007    PCP: Norleen JAYSON Jobs, MD  REFERRING PROVIDER: Elspeth Parker, MD  REFERRING DIAG: M51.16 (ICD-10-CM) - Intervertebral disc disorders with radiculopathy, lumbar  region  Rationale for Evaluation and Treatment: Rehabilitation  THERAPY DIAG:  Abnormal posture - Plan: PT plan of care cert/re-cert  Muscle weakness (generalized) - Plan: PT plan of care cert/re-cert  Radiculopathy, lumbar region - Plan: PT plan of care cert/re-cert  Difficulty in walking, not elsewhere classified - Plan: PT plan of care cert/re-cert  ONSET DATE: Bad pain started in early March (6th) 2025 with standing and walking  SUBJECTIVE:  SUBJECTIVE STATEMENT: [name] notes low back and left gluteal pain starting March 6th of this year.  Symptoms originated with prolonged standing and walking and her granddaughter's school.  Symptoms have not improved since that time and she is concerned as she has an upcoming trip to [place] in September.  She is not sure she is going to be able to participate as much as she would like secondary to low back and the left lower extremity symptoms.  PERTINENT HISTORY:  Rt RTC repair 2015  PAIN:  Are you having pain? Yes: NPRS scale: 3-7/1 this week  Pain location: Low back and left lower extremity as distal as the lateral calf Pain description: Ache, sore, can be sharp Aggravating factors: Prolonged standing and walking Relieving factors: Change of position  PRECAUTIONS: Back  RED FLAGS: None   WEIGHT BEARING RESTRICTIONS: No  FALLS:  Has patient fallen in last 6 months? No  LIVING ENVIRONMENT: Lives with: lives with their family and lives with their spouse Lives in: House/apartment Stairs: No problem Has following equipment at home: None  OCCUPATION: Works 1 day a week at [employer]  PLOF: Independent  PATIENT GOALS: Get back to where she was before the flare-up in March  NEXT MD VISIT: Dr Eldonna 10/26/2023  OBJECTIVE:  Note: Objective measures  were completed at Evaluation unless otherwise noted.  DIAGNOSTIC FINDINGS:  IMPRESSION: 1. Asymmetric marrow edema within the left L5 pedicle and transverse process, corresponding with a probable nondisplaced fracture on the recent abdominopelvic CT. No displaced fracture or other acute osseous findings demonstrated. 2. Chronic spondylosis at L4-5 with asymmetric disc bulging and endplate osteophytes asymmetric to the left. There is mild narrowing of the left lateral recess and mild to moderate left foraminal narrowing without definite nerve root impingement. 3. Mild left foraminal narrowing at L5-S1 without definite nerve root impingement. 4. No spinal stenosis or definite nerve root impingement. 5. Convex left thoracolumbar scoliosis.  PATIENT SURVEYS:  PSFS: THE PATIENT SPECIFIC FUNCTIONAL SCALE  Place score of 0-10 (0 = unable to perform activity and 10 = able to perform activity at the same level as before injury or problem)  Activity Date: 09/23/2023    Standing 4/10    2.  Showering 4/10    3.  Walking 4/10    4.      Total Score 4      Total Score = Sum of activity scores/number of activities  Minimally Detectable Change: 3 points (for single activity); 2 points (for average score)  [facility] (nd). The Patient Specific Functional Scale . Retrieved from [email or web address]   COGNITION: Overall cognitive status: Within functional limits for tasks assessed     SENSATION: Daily left lower extremity pain and paresthesias as distal as the lateral calf, mostly lateral hip  MUSCLE LENGTH: Hamstrings: Right 55 deg; Left 55 deg   POSTURE: rounded shoulders, forward head, and decreased lumbar lordosis  LUMBAR ROM:   AROM 09/23/2023  Flexion   Extension 15  Right lateral flexion   Left lateral flexion 40  Right rotation 30  Left rotation    (Blank rows = not tested)  LOWER EXTREMITY ROM:      Passive  Left/Right 09/23/2023   Hip flexion 110/115   Hip extension    Hip abduction    Hip adduction    Hip internal rotation 22/14   Hip external rotation 45/46   Knee flexion    Knee extension    Ankle dorsiflexion 10/7  Ankle plantarflexion    Ankle inversion    Ankle eversion     (Blank rows = not tested)  STRENGTH     09/23/2023   Hip flexion    Hip extension    Hip abduction    Hip adduction    Hip internal rotation    Hip external rotation    Knee flexion    Knee extension    Ankle dorsiflexion    Ankle plantarflexion    Ankle inversion    Ankle eversion    Lumbar extension (superwoman test) 15 seconds with pain (Goal is 30 to 60 seconds without pain)    (Blank rows = not tested)  GAIT: Distance walked: 50 feet Assistive device utilized: None Level of assistance: Complete Independence Comments: [name] is currently walking 4 days a week for 20 to 30 minutes for exercise.  TREATMENT DATE: 09/23/2023 Limited range lumbar extension AROM, hands-on gluteals and gentle extension 10 x 3 seconds Scapular retraction/shoulder blade pinches 10 x 5 seconds Upper heel cords box with toe slightly in 60 seconds      Prone alternating hip extensions 10 x 3 seconds                 [id number]: Reviewed imaging with a spine model; reviewed disc pressures in various positions as it relates to her upcoming [place] vacation; practical logroll; practical golfers and diagonal squat lifts; reviewed examination findings and data on home exercise program                                                                                                     PATIENT EDUCATION:  Education details: See above Person educated: Patient Education method: Explanation, Demonstration, Tactile cues, Verbal cues, and Handouts Education comprehension: verbalized understanding, returned demonstration, verbal cues required, tactile cues required, and needs further education  HOME EXERCISE  PROGRAM: Access Code: [email or web address] URL: [email or web address] Date: 09/23/2023 Prepared by: Lamar Ivory  Exercises - Standing Lumbar Extension at Wall - Forearms  - 5 x daily - 7 x weekly - 1 sets - 5 reps - 3 seconds hold - Standing Scapular Retraction  - 5 x daily - 7 x weekly - 1 sets - 5 reps - 5 second hold - Slant Board Gastrocnemius Stretch  - 2 x daily - 7 x weekly - 1 sets - 5 reps - 60 hold - Prone Hip Extension  - 2 x daily - 7 x weekly - 1-2 sets - 10 reps - 3-5 seconds hold  ASSESSMENT:  CLINICAL IMPRESSION: Patient is a 68 y.o. female who was seen today for physical therapy evaluation and treatment for M51.16 (ICD-10-CM) - Intervertebral disc disorders with radiculopathy, lumbar region.  [name]'s symptoms originated in March of this year with prolonged standing and walking at her granddaughter's school.  Although she is able to walk 20 to 30 minutes for exercise, she is not able to walk or function at a level that she finds acceptable.  She has an upcoming [place] vacation that will require a 9-hour flight along with lots of standing and walking but she  is concerned she would not be able to fully participate in.  Currently, symptoms can be as distal as the left lateral calf and can be as high as 7/10 on the numeric pain rating scale.  Postural and strength impairments were the most significant clinical findings today and we started addressing this with her day 1 home exercises.  Her prognosis to meet the below listed goals is good with the recommended plan of care.  OBJECTIVE IMPAIRMENTS: Abnormal gait, decreased activity tolerance, decreased endurance, decreased knowledge of condition, difficulty walking, decreased ROM, decreased strength, decreased safety awareness, impaired perceived functional ability, improper body mechanics, postural dysfunction, and pain.   ACTIVITY LIMITATIONS: carrying, lifting, bending, sitting, standing, sleeping, bed mobility, and locomotion  level  PARTICIPATION LIMITATIONS: community activity  PERSONAL FACTORS: Rt RTC repair 2015 may also affect this patient's functional outcome.   REHAB POTENTIAL: Good  CLINICAL DECISION MAKING: Stable/uncomplicated  EVALUATION COMPLEXITY: Moderate   GOALS: Goals reviewed with patient? Yes  SHORT TERM GOALS: Target date: 10/21/2023  Madelin will be independent with her day 1 home exercise program Baseline: Started 09/23/2023 Goal status: INITIAL  2.  [name] will have improved postural awareness and ability to implement this into all static and dynamic activities Baseline: Core body mechanics with flexion in a seated position when dropping things and picking up her purse at the end of her treatment today Goal status: INITIAL  3.  Improve spine strength as assessed by 30 seconds of pain-free prone superwoman test position Baseline: 15 seconds with pain Goal status: INITIAL   LONG TERM GOALS: Target date: 11/18/2023  Improve patient specific functional score to at least 7 Baseline: 4 Goal status: INITIAL  2.  [name] will report low back pain consistently 3/10 or less on the numeric pain rating scale with no reports of hip or lower extremity pain Baseline: 3-7/10 with left hip and lower extremity pain as distal as the left lateral calf Goal status: INITIAL  3.  Improve spine strength as assessed by 60 seconds of pain-free superwoman testing Baseline: 15 seconds with pain Goal status: INITIAL  4.  [name] will be independent with her daily maintenance home exercise program at discharge Baseline: Started 09/23/2023 Goal status: INITIAL   PLAN:  PT FREQUENCY: 2x/week  PT DURATION: 8 weeks  PLANNED INTERVENTIONS: 97750- Physical Performance Testing, 97110-Therapeutic exercises, 97530- Therapeutic activity, 97112- Neuromuscular re-education, 97535- Self Care, 02859- Manual therapy, [id number]- Electrical stimulation (unattended), 02987- Traction (mechanical), 20560 (1-2 muscles), 20561  (3+ muscles)- Dry Needling, Patient/Family education, Spinal mobilization, and Cryotherapy.  PLAN FOR NEXT SESSION: Review day 1 home exercises with appropriate postural, scapular and low back strength progressions.  She will need repetition on body mechanics and practical lifting technique for her upcoming vacation.  Consider cervical traction if peripheral symptoms persist after 3 to 4 weeks of appropriate physical therapy interventions.   Myer LELON Ivory, PT, MPT 09/23/2023, 11:39 AM

## 2023-09-23 NOTE — Telephone Encounter (Signed)
 ordered

## 2023-09-24 ENCOUNTER — Ambulatory Visit: Admitting: Rehabilitative and Restorative Service Providers"

## 2023-09-24 ENCOUNTER — Encounter: Payer: Self-pay | Admitting: Rehabilitative and Restorative Service Providers"

## 2023-09-24 DIAGNOSIS — R262 Difficulty in walking, not elsewhere classified: Secondary | ICD-10-CM | POA: Diagnosis not present

## 2023-09-24 DIAGNOSIS — M5416 Radiculopathy, lumbar region: Secondary | ICD-10-CM | POA: Diagnosis not present

## 2023-09-24 DIAGNOSIS — M6281 Muscle weakness (generalized): Secondary | ICD-10-CM | POA: Diagnosis not present

## 2023-09-24 DIAGNOSIS — R293 Abnormal posture: Secondary | ICD-10-CM

## 2023-09-24 NOTE — Therapy (Signed)
 OUTPATIENT PHYSICAL THERAPY THORACOLUMBAR TREATMENT   Patient Name: [name] MRN: [id number] DOB:[birth year], 68 y.o., female Today's Date: 09/24/2023  END OF SESSION:  PT End of Session - 09/24/23 1431     Visit Number 2    Number of Visits 16    Date for PT Re-Evaluation 11/18/23    Authorization - Number of Visits 16    Progress Note Due on Visit 10    PT Start Time 1430    PT Stop Time 1513    PT Time Calculation (min) 43 min    Activity Tolerance Patient tolerated treatment well;No increased pain    Behavior During Therapy WFL for tasks assessed/performed           Past Medical History:  Diagnosis Date   Anxiety    very high   GERD (gastroesophageal reflux disease)    Glaucoma    Heart murmur    Pt saw Cardiologist when she was in her 53s, not since.   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   Osteoporosis    Scoliosis    Past Surgical History:  Procedure Laterality Date   CESAREAN SECTION  1988   SHOULDER ARTHROSCOPY WITH SUBACROMIAL DECOMPRESSION AND OPEN ROTATOR C Right 12/09/2013   Procedure: RIGHT SHOULDER ARTHROSCOPY WITH SUBACROMIAL DECOMPRESSION AND MINI OPEN ROTATOR CUFF REPAIR, AND BICEPS TENDODESIS;  Surgeon: Elspeth JONELLE Her, MD;  Location: [facility] OR;  Service: Orthopedics;  Laterality: Right;   TONSILLECTOMY     Patient Active Problem List   Diagnosis Date Noted   Hyperlipidemia 03/25/2022   Herpes labialis 02/16/2021   Gastroesophageal reflux disease without esophagitis 02/15/2018   Lactose intolerance 02/15/2018   History of vitamin D  deficiency 02/15/2018   Age-related osteoporosis without current pathological fracture 02/15/2018   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   Spondylosis 05/26/2007   Disorder of gallbladder 03/11/2007    PCP: Norleen JAYSON Jobs, MD  REFERRING PROVIDER: Elspeth Parker, MD  REFERRING DIAG: M51.16 (ICD-10-CM) - Intervertebral disc disorders with radiculopathy, lumbar  region  Rationale for Evaluation and Treatment: Rehabilitation  THERAPY DIAG:  Abnormal posture  Muscle weakness (generalized)  Radiculopathy, lumbar region  Difficulty in walking, not elsewhere classified  ONSET DATE: Bad pain started in early March (6th) 2025 with standing and walking  SUBJECTIVE:                                                                                                                                                                                           SUBJECTIVE STATEMENT: [name] reports 30 minutes of walking  this morning and good HEP compliance.    [name] notes low back and left gluteal pain starting March 6th of this year.  Symptoms originated with prolonged standing and walking and her granddaughter's school.  Symptoms have not improved since that time and she is concerned as she has an upcoming trip to [place] in September.  She is not sure she is going to be able to participate as much as she would like secondary to low back and the left lower extremity symptoms.  PERTINENT HISTORY:  Rt RTC repair 2015  PAIN:  Are you having pain? Yes: NPRS scale: 3-7/10 this week  Pain location: Low back and left lower extremity as distal as the lateral calf Pain description: Ache, sore, can be sharp Aggravating factors: Prolonged standing and walking Relieving factors: Change of position  PRECAUTIONS: Back  RED FLAGS: None   WEIGHT BEARING RESTRICTIONS: No  FALLS:  Has patient fallen in last 6 months? No  LIVING ENVIRONMENT: Lives with: lives with their family and lives with their spouse Lives in: House/apartment Stairs: No problem Has following equipment at home: None  OCCUPATION: Works 1 day a week at [employer]  PLOF: Independent  PATIENT GOALS: Get back to where she was before the flare-up in March  NEXT MD VISIT: Dr Eldonna 10/26/2023  OBJECTIVE:  Note: Objective measures were completed at Evaluation unless otherwise noted.  DIAGNOSTIC  FINDINGS:  IMPRESSION: 1. Asymmetric marrow edema within the left L5 pedicle and transverse process, corresponding with a probable nondisplaced fracture on the recent abdominopelvic CT. No displaced fracture or other acute osseous findings demonstrated. 2. Chronic spondylosis at L4-5 with asymmetric disc bulging and endplate osteophytes asymmetric to the left. There is mild narrowing of the left lateral recess and mild to moderate left foraminal narrowing without definite nerve root impingement. 3. Mild left foraminal narrowing at L5-S1 without definite nerve root impingement. 4. No spinal stenosis or definite nerve root impingement. 5. Convex left thoracolumbar scoliosis.  PATIENT SURVEYS:  PSFS: THE PATIENT SPECIFIC FUNCTIONAL SCALE  Place score of 0-10 (0 = unable to perform activity and 10 = able to perform activity at the same level as before injury or problem)  Activity Date: 09/23/2023    Standing 4/10    2.  Showering 4/10    3.  Walking 4/10    4.      Total Score 4      Total Score = Sum of activity scores/number of activities  Minimally Detectable Change: 3 points (for single activity); 2 points (for average score)  [facility] (nd). The Patient Specific Functional Scale . Retrieved from [email or web address]   COGNITION: Overall cognitive status: Within functional limits for tasks assessed     SENSATION: Daily left lower extremity pain and paresthesias as distal as the lateral calf, mostly lateral hip  MUSCLE LENGTH: Hamstrings: Right 55 deg; Left 55 deg   POSTURE: rounded shoulders, forward head, and decreased lumbar lordosis  LUMBAR ROM:   AROM 09/23/2023  Flexion   Extension 15  Right lateral flexion   Left lateral flexion 40  Right rotation 30  Left rotation    (Blank rows = not tested)  LOWER EXTREMITY ROM:     Passive  Left/Right 09/23/2023   Hip flexion 110/115   Hip extension     Hip abduction    Hip adduction    Hip internal rotation 22/14   Hip external rotation 45/46   Knee flexion    Knee extension  Ankle dorsiflexion 10/7   Ankle plantarflexion    Ankle inversion    Ankle eversion     (Blank rows = not tested)  STRENGTH     09/23/2023   Hip flexion    Hip extension    Hip abduction    Hip adduction    Hip internal rotation    Hip external rotation    Knee flexion    Knee extension    Ankle dorsiflexion    Ankle plantarflexion    Ankle inversion    Ankle eversion    Lumbar extension (superwoman test) 15 seconds with pain (Goal is 30 to 60 seconds without pain)    (Blank rows = not tested)  GAIT: Distance walked: 50 feet Assistive device utilized: None Level of assistance: Complete Independence Comments: [name] is currently walking 4 days a week for 20 to 30 minutes for exercise.  TREATMENT DATE:  09/24/2023 Limited range lumbar extension AROM, hands-on gluteals and gentle extension 10 x 3 seconds Scapular retraction/shoulder blade pinches 10 x 5 seconds Upper heel cords box with toe slightly in 60 seconds   Lower heel cords box with toe slightly in 60 seconds    Prone alternating hip extensions 10 x 3 seconds      [place] 10 x 5 seconds Hip hike at counter top and in door frame 10 x 3 seconds each            97535: Practical logroll; practical golfers and diagonal squat lifts; reviewed changes to HEP   09/23/2023 Limited range lumbar extension AROM, hands-on gluteals and gentle extension 10 x 3 seconds Scapular retraction/shoulder blade pinches 10 x 5 seconds Upper heel cords box with toe slightly in 60 seconds      Prone alternating hip extensions 10 x 3 seconds                 97535: Reviewed imaging with a spine model; reviewed disc pressures in various positions as it relates to her upcoming [place] vacation; practical logroll; practical golfers and diagonal squat lifts; reviewed examination findings and data on home  exercise program                                                                                                     PATIENT EDUCATION:  Education details: See above Person educated: Patient Education method: Explanation, Demonstration, Tactile cues, Verbal cues, and Handouts Education comprehension: verbalized understanding, returned demonstration, verbal cues required, tactile cues required, and needs further education  HOME EXERCISE PROGRAM: Access Code: [email or web address] URL: [email or web address] Date: 09/23/2023 Prepared by: Lamar Ivory  Exercises - Standing Lumbar Extension at Wall - Forearms  - 5 x daily - 7 x weekly - 1 sets - 5 reps - 3 seconds hold - Standing Scapular Retraction  - 5 x daily - 7 x weekly - 1 sets - 5 reps - 5 second hold - Slant Board Gastrocnemius Stretch  - 2 x daily - 7 x weekly - 1 sets - 5 reps - 60 hold - Prone Hip Extension  - 2 x daily -  7 x weekly - 1-2 sets - 10 reps - 3-5 seconds hold  ASSESSMENT:  CLINICAL IMPRESSION: [name] was very compliant with her day 1 home exercises.  She did require some correction and we made some progressions to her postural and spine strength activities.  She will continue to benefit from postural and body mechanics correction along with addressing other impairments noted at yesterday's evaluation.  Her prognosis to meet long-term goals remains good with the recommended plan of care.   Patient is a 68 y.o. female who was seen today for physical therapy evaluation and treatment for M51.16 (ICD-10-CM) - Intervertebral disc disorders with radiculopathy, lumbar region.  [name]'s symptoms originated in March of this year with prolonged standing and walking at her granddaughter's school.  Although she is able to walk 20 to 30 minutes for exercise, she is not able to walk or function at a level that she finds acceptable.  She has an upcoming [place] vacation that will require a 9-hour flight along with lots of standing and  walking but she is concerned she would not be able to fully participate in.  Currently, symptoms can be as distal as the left lateral calf and can be as high as 7/10 on the numeric pain rating scale.  Postural and strength impairments were the most significant clinical findings today and we started addressing this with her day 1 home exercises.  Her prognosis to meet the below listed goals is good with the recommended plan of care.  OBJECTIVE IMPAIRMENTS: Abnormal gait, decreased activity tolerance, decreased endurance, decreased knowledge of condition, difficulty walking, decreased ROM, decreased strength, decreased safety awareness, impaired perceived functional ability, improper body mechanics, postural dysfunction, and pain.   ACTIVITY LIMITATIONS: carrying, lifting, bending, sitting, standing, sleeping, bed mobility, and locomotion level  PARTICIPATION LIMITATIONS: community activity  PERSONAL FACTORS: Rt RTC repair 2015 may also affect this patient's functional outcome.   REHAB POTENTIAL: Good  CLINICAL DECISION MAKING: Stable/uncomplicated  EVALUATION COMPLEXITY: Moderate   GOALS: Goals reviewed with patient? Yes  SHORT TERM GOALS: Target date: 10/21/2023  Madelin will be independent with her day 1 home exercise program Baseline: Started 09/23/2023 Goal status: Ongoing 09/24/2023  2.  [name] will have improved postural awareness and ability to implement this into all static and dynamic activities Baseline: Core body mechanics with flexion in a seated position when dropping things and picking up her purse at the end of her treatment today Goal status: Ongoing 09/24/2023  3.  Improve spine strength as assessed by 30 seconds of pain-free prone superwoman test position Baseline: 15 seconds with pain Goal status: INITIAL   LONG TERM GOALS: Target date: 11/18/2023  Improve patient specific functional score to at least 7 Baseline: 4 Goal status: INITIAL  2.  [name] will report low  back pain consistently 3/10 or less on the numeric pain rating scale with no reports of hip or lower extremity pain Baseline: 3-7/10 with left hip and lower extremity pain as distal as the left lateral calf Goal status: INITIAL  3.  Improve spine strength as assessed by 60 seconds of pain-free superwoman testing Baseline: 15 seconds with pain Goal status: INITIAL  4.  [name] will be independent with her daily maintenance home exercise program at discharge Baseline: Started 09/23/2023 Goal status: INITIAL   PLAN:  PT FREQUENCY: 2x/week  PT DURATION: 8 weeks  PLANNED INTERVENTIONS: 97750- Physical Performance Testing, 97110-Therapeutic exercises, 97530- Therapeutic activity, [id number]- Neuromuscular re-education, 97535- Self Care, 02859- Manual therapy, H9716- Electrical stimulation (unattended), [id number]-  Traction (mechanical), 79439 (1-2 muscles), 20561 (3+ muscles)- Dry Needling, Patient/Family education, Spinal mobilization, and Cryotherapy.  PLAN FOR NEXT SESSION: Review current home exercises with appropriate postural, scapular and low back strength progressions.  She will need repetition on body mechanics and practical lifting technique for her upcoming vacation.  Consider traction if peripheral symptoms persist after 3 to 4 weeks of appropriate physical therapy interventions.   Myer LELON Ivory, PT, MPT 09/24/2023, 4:33 PM

## 2023-09-25 ENCOUNTER — Telehealth: Payer: Self-pay

## 2023-09-25 ENCOUNTER — Other Ambulatory Visit: Payer: Self-pay | Admitting: Obstetrics and Gynecology

## 2023-09-25 NOTE — Telephone Encounter (Signed)
 Spoke to patient and advised her that I would move her appointment due to finding out the Administrative fee for the Evenity  being she had contacted the speciality pharmacy and she will be picking her Meds up so she wants ro move her appointment out to the following Monday at the same time

## 2023-09-25 NOTE — Telephone Encounter (Signed)
 Med refill request: estradiol  10 mcg vaginal tabs Last AEX: 04/08/22 Dr. Lavoie Next AEX: none scheduled Staff message sent to front desk to schedule annual exam Last MMG (if hormonal med) 08/07/22 BI-RADS 1 negative Refill denied, needs appointment.   Sent to provider review.

## 2023-09-25 NOTE — Telephone Encounter (Signed)
 Patient called and stated that she had spoken to [employer] and they advised her that if she pick her Evenity  up at the pharmacy she would pay $250.  After speaking to Luke at [facility] she directed me to the pharmacy department and I spoke to sloan, Sullivan said if patient picks her meds up at [facility] that she has a 40% coinsurance and would only pay $250 for the meds and here at the office she would only pay the admin fee and copay if she has one  The script needs to be called into her pharmacy for hr to pick up

## 2023-09-28 ENCOUNTER — Ambulatory Visit

## 2023-09-28 NOTE — Progress Notes (Signed)
 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  Patient seen previously but not since May 2021. Echo 7/19 with normal LV function; trace AI and mild LAE. Nuclear study 9/19 showed EF 70 and normal perfusion.  Patient seen in [place] in May for a cough.  Chest x-ray was performed and showed elongated and torturous thoracic aorta.  CTA in the emergency room May 2025 showed no thoracic aneurysm or dissection but there was note of a sternal fracture.  Abdominal ultrasound May 2025 showed no aneurysm.  Cardiology now asked to evaluate.  Patient denies dyspnea on exertion, orthopnea, PND, pedal edema, chest pain or syncope.  Current Outpatient Medications  Medication Sig Dispense Refill   aspirin EC 81 MG tablet Take 81 mg by mouth daily.     Cholecalciferol (VITAMIN D ) 2000 UNITS CAPS Take 2,000 Units by mouth daily.     cycloSPORINE (RESTASIS) 0.05 % ophthalmic emulsion Place 1 drop into both eyes 2 (two) times daily.     Estradiol  (YUVAFEM ) 10 MCG TABS vaginal tablet PLACE 1 INSERT VAGINALLY TWICE WEEKLY ON TUESDAYS AND FRIDAYS 24 tablet 1   famotidine (PEPCID) 20 MG tablet Take 10 mg by mouth daily.      gabapentin  (NEURONTIN ) 300 MG capsule Take 300 mg by mouth 2 (two) times daily.     latanoprost (XALATAN) 0.005 % ophthalmic solution Place 1 drop into both eyes at bedtime.     Omega-3 Fatty Acids (FISH OIL PO) Take 1 capsule by mouth daily.     Current Facility-Administered Medications  Medication Dose Route Frequency Provider Last Rate Last Admin   Romosozumab -aqqg (EVENITY ) 105 MG/1. injection 210 mg  210 mg Subcutaneous Q30 days Persons, Ronal Dragon, [place]       [DUJMU ON 11/04/2023] Romosozumab -aqqg (EVENITY ) 105 MG/1. injection 210 mg  210 mg Subcutaneous Q30 days Persons, Ronal Dragon, [place]        No Known Allergies   Past Medical History:   Diagnosis Date   Anxiety    very high   GERD (gastroesophageal reflux disease)    Glaucoma    Heart murmur    Pt saw Cardiologist when she was in her 42s, not since.   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   Osteoporosis    Scoliosis     Past Surgical History:  Procedure Laterality Date   CESAREAN SECTION  1988   SHOULDER ARTHROSCOPY WITH SUBACROMIAL DECOMPRESSION AND OPEN ROTATOR C Right 12/09/2013   Procedure: RIGHT SHOULDER ARTHROSCOPY WITH SUBACROMIAL DECOMPRESSION AND MINI OPEN ROTATOR CUFF REPAIR, AND BICEPS TENDODESIS;  Surgeon: Elspeth JONELLE Her, MD;  Location: [facility] OR;  Service: Orthopedics;  Laterality: Right;   TONSILLECTOMY      Social History   Socioeconomic History   Marital status: Married    Spouse name: Not on file   Number of children: 2   Years of education: Masters   Highest education level: Not on file  Occupational History   Occupation: Management/Librarian  Tobacco Use   Smoking status: Never   Smokeless tobacco: Never  Vaping Use   Vaping status: Never Used  Substance and Sexual Activity   Alcohol use: Yes    Comment: occ   Drug use: No   Sexual activity: Yes    Partners: Male  Birth control/protection: Post-menopausal    Comment: 1st intercourse- 23, partner- 1  Other Topics Concern   Not on file  Social History Narrative   Lives at home with her husband.   Right-handed.   6-8 cups caffeine per day.   Social Drivers of Corporate investment banker Strain: Low Risk  (06/02/2023)   Overall Financial Resource Strain (CARDIA)    Difficulty of Paying Living Expenses: Not hard at all  Food Insecurity: No Food Insecurity (06/02/2023)   Hunger Vital Sign    Worried About Running Out of Food in the Last Year: Never true    Ran Out of Food in the Last Year: Never true  Transportation Needs: No Transportation Needs (06/02/2023)   PRAPARE - Scientist, research (physical sciences) (Medical): No    Lack of Transportation (Non-Medical): No  Physical Activity: Insufficiently Active (06/02/2023)   Exercise Vital Sign    Days of Exercise per Week: 2 days    Minutes of Exercise per Session: 20 min  Stress: Stress Concern Present (06/02/2023)   [employer] of Occupational Health - Occupational Stress Questionnaire    Feeling of Stress : Rather much  Social Connections: Moderately Integrated (06/02/2023)   Social Connection and Isolation Panel    Frequency of Communication with Friends and Family: More than three times a week    Frequency of Social Gatherings with Friends and Family: Once a week    Attends Religious Services: More than 4 times per year    Active Member of [employer] or Organizations: No    Attends Engineer, structural: Not on file    Marital Status: Married  Catering manager Violence: Not At Risk (06/02/2023)   Humiliation, Afraid, Rape, and Kick questionnaire    Fear of Current or Ex-Partner: No    Emotionally Abused: No    Physically Abused: No    Sexually Abused: No    Family History  Problem Relation Age of Onset   Arthritis Mother    Hyperlipidemia Mother    Atrial fibrillation Mother    Skin cancer Father    Depression Father    Parkinson's disease Father    CAD Father    Colon cancer Neg Hx    Colon polyps Neg Hx    Esophageal cancer Neg Hx    Stomach cancer Neg Hx    Rectal cancer Neg Hx     ROS: no fevers or chills, productive cough, hemoptysis, dysphasia, odynophagia, melena, hematochezia, dysuria, hematuria, rash, seizure activity, orthopnea, PND, pedal edema, claudication. Remaining systems are negative.  Physical Exam:   Blood pressure 108/72, pulse 85, height 5' 3 (1.6 m), weight 137 lb 6.4 oz (62.3 kg), SpO2 99%.  General:  Well developed/well nourished in NAD Skin warm/dry Patient not depressed No peripheral clubbing Back-normal HEENT-normal/normal eyelids Neck supple/normal carotid upstroke  bilaterally; no bruits; no JVD; no thyromegaly chest - CTA/ normal expansion CV - RRR/normal S1 and S2; no murmurs, rubs or gallops;  PMI nondisplaced Abdomen -NT/ND, no HSM, no mass, + bowel sounds, no bruit 2+ femoral pulses, no bruits Ext-no edema, chords, 2+ DP Neuro-grossly nonfocal  ECG 08/02/23 showed sinus rhythm- personally reviewed  A/P  1 question elongated and tortuous thoracic aorta-follow-up CTA showed no thoracic aortic aneurysm and abdominal ultrasound showed no abdominal aortic aneurysm.  No indication for further evaluation.  2 history of mitral valve prolapse-not evident on most recent echocardiogram.  3 hyperlipidemia-mild hyperlipidemia in the past.  Will arrange calcium score  for restratification (note her father had stents when he was 57).  If elevated will likely need statin therapy.  Redell Shallow, MD

## 2023-09-28 NOTE — Therapy (Signed)
 OUTPATIENT PHYSICAL THERAPY THORACOLUMBAR TREATMENT   Patient Name: [name] MRN: [id number] DOB:July 02, 1955, 68 y.o., female Today's Date: 09/29/2023  END OF SESSION:  PT End of Session - 09/29/23 1433     Visit Number 3    Number of Visits 16    Date for PT Re-Evaluation 11/18/23    Authorization - Number of Visits 16    Progress Note Due on Visit 10    PT Start Time 1434    PT Stop Time 1525    PT Time Calculation (min) 51 min    Activity Tolerance Patient tolerated treatment well;No increased pain    Behavior During Therapy WFL for tasks assessed/performed            Past Medical History:  Diagnosis Date   Anxiety    very high   GERD (gastroesophageal reflux disease)    Glaucoma    Heart murmur    Pt saw Cardiologist when she was in her 75s, not since.   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   Osteoporosis    Scoliosis    Past Surgical History:  Procedure Laterality Date   CESAREAN SECTION  1988   SHOULDER ARTHROSCOPY WITH SUBACROMIAL DECOMPRESSION AND OPEN ROTATOR C Right 12/09/2013   Procedure: RIGHT SHOULDER ARTHROSCOPY WITH SUBACROMIAL DECOMPRESSION AND MINI OPEN ROTATOR CUFF REPAIR, AND BICEPS TENDODESIS;  Surgeon: Elspeth JONELLE Her, MD;  Location: [facility] OR;  Service: Orthopedics;  Laterality: Right;   TONSILLECTOMY     Patient Active Problem List   Diagnosis Date Noted   Hyperlipidemia 03/25/2022   Herpes labialis 02/16/2021   Gastroesophageal reflux disease without esophagitis 02/15/2018   Lactose intolerance 02/15/2018   History of vitamin D  deficiency 02/15/2018   Age-related osteoporosis without current pathological fracture 02/15/2018   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   Spondylosis 05/26/2007   Disorder of gallbladder 03/11/2007    PCP: Norleen JAYSON Jobs, MD  REFERRING PROVIDER: Elspeth Parker, MD  REFERRING DIAG: M51.16 (ICD-10-CM) - Intervertebral disc disorders with radiculopathy,  lumbar region  Rationale for Evaluation and Treatment: Rehabilitation  THERAPY DIAG:  Abnormal posture  Difficulty in walking, not elsewhere classified  Pain in left hip  Muscle weakness (generalized)  Radiculopathy, lumbar region  ONSET DATE: Bad pain started in early March (6th) 2025 with standing and walking  SUBJECTIVE:                                                                                                                                                                                           SUBJECTIVE STATEMENT:  I'm having pain with several of the exercises. Rating pain 2/10 while seated and resting. Numbness in L mid thoracic musculature during thoracic extension. Pain in middle lower back with prone leg extension.  PERTINENT HISTORY:  Rt RTC repair 2015  PAIN:  Are you having pain? Yes: NPRS scale: 3-7/10 this week  Pain location: Low back and left lower extremity as distal as the lateral calf Pain description: Ache, sore, can be sharp Aggravating factors: Prolonged standing and walking Relieving factors: Change of position  PRECAUTIONS: Back  RED FLAGS: None   WEIGHT BEARING RESTRICTIONS: No  FALLS:  Has patient fallen in last 6 months? No  LIVING ENVIRONMENT: Lives with: lives with their family and lives with their spouse Lives in: House/apartment Stairs: No problem Has following equipment at home: None  OCCUPATION: Works 1 day a week at [employer]  PLOF: Independent  PATIENT GOALS: Get back to where she was before the flare-up in March  NEXT MD VISIT: Dr Eldonna 10/26/2023  OBJECTIVE:  Note: Objective measures were completed at Evaluation unless otherwise noted.  DIAGNOSTIC FINDINGS:  IMPRESSION: 1. Asymmetric marrow edema within the left L5 pedicle and transverse process, corresponding with a probable nondisplaced fracture on the recent abdominopelvic CT. No displaced fracture or other acute osseous findings demonstrated. 2.  Chronic spondylosis at L4-5 with asymmetric disc bulging and endplate osteophytes asymmetric to the left. There is mild narrowing of the left lateral recess and mild to moderate left foraminal narrowing without definite nerve root impingement. 3. Mild left foraminal narrowing at L5-S1 without definite nerve root impingement. 4. No spinal stenosis or definite nerve root impingement. 5. Convex left thoracolumbar scoliosis.  PATIENT SURVEYS:  PSFS: THE PATIENT SPECIFIC FUNCTIONAL SCALE  Place score of 0-10 (0 = unable to perform activity and 10 = able to perform activity at the same level as before injury or problem)  Activity Date: 09/23/2023    Standing 4/10    2.  Showering 4/10    3.  Walking 4/10    4.      Total Score 4      Total Score = Sum of activity scores/number of activities  Minimally Detectable Change: 3 points (for single activity); 2 points (for average score)  [facility] (nd). The Patient Specific Functional Scale . Retrieved from [email or web address]   COGNITION: Overall cognitive status: Within functional limits for tasks assessed     SENSATION: Daily left lower extremity pain and paresthesias as distal as the lateral calf, mostly lateral hip  MUSCLE LENGTH: Hamstrings: Right 55 deg; Left 55 deg   POSTURE: rounded shoulders, forward head, and decreased lumbar lordosis  LUMBAR ROM:   AROM 09/23/2023  Flexion   Extension 15  Right lateral flexion   Left lateral flexion 40  Right rotation 30  Left rotation    (Blank rows = not tested)  LOWER EXTREMITY ROM:     Passive  Left/Right 09/23/2023   Hip flexion 110/115   Hip extension    Hip abduction    Hip adduction    Hip internal rotation 22/14   Hip external rotation 45/46   Knee flexion    Knee extension    Ankle dorsiflexion 10/7   Ankle plantarflexion    Ankle inversion    Ankle eversion     (Blank rows = not  tested)  STRENGTH     09/23/2023   Hip flexion    Hip extension    Hip abduction    Hip  adduction    Hip internal rotation    Hip external rotation    Knee flexion    Knee extension    Ankle dorsiflexion    Ankle plantarflexion    Ankle inversion    Ankle eversion    Lumbar extension (superwoman test) 15 seconds with pain (Goal is 30 to 60 seconds without pain)    (Blank rows = not tested)  GAIT: Distance walked: 50 feet Assistive device utilized: None Level of assistance: Complete Independence Comments: [name] is currently walking 4 days a week for 20 to 30 minutes for exercise.  TREATMENT DATE:  09/29/2023 TherEx:  Assessed prone leg extensions; applied pillow under hips to assist with pain, but continuing to experience middle low back pain  Standing lumbar extension  Causing pain that feels like a pulled muscle in middle low back; PT discussed performing exercise every other day to see if that helps  Modified cobra pose with 2-3s hold at top for 2x10 PT educating on importance of form and only going through ROM that is pain-free Seated lumbar rollouts with green theraball 3x30s  Slant board gastroc stretch 3x30s  Reviewed performing gastroc stretch at home with 6 step in parallel bars  Standing rows with green TB 3x10 with 2-3s hold (rated easy-to-medium)  Standing alternating hip extension 3x8 each leg  Intermittent L hip pain noted, but improved with rest   Self-Care: PT discussed dry needling options, pain levels with mobility, changes to HEP, progressions/regressions for HEP, and how LE muscular strength can affect the back    09/24/2023 Limited range lumbar extension AROM, hands-on gluteals and gentle extension 10 x 3 seconds Scapular retraction/shoulder blade pinches 10 x 5 seconds Upper heel cords box with toe slightly in 60 seconds   Lower heel cords box with toe slightly in 60 seconds    Prone alternating hip extensions 10 x 3 seconds      Yoga Bridge 10 x  5 seconds Hip hike at counter top and in door frame 10 x 3 seconds each            97535: Practical logroll; practical golfers and diagonal squat lifts; reviewed changes to HEP   09/23/2023 Limited range lumbar extension AROM, hands-on gluteals and gentle extension 10 x 3 seconds Scapular retraction/shoulder blade pinches 10 x 5 seconds Upper heel cords box with toe slightly in 60 seconds      Prone alternating hip extensions 10 x 3 seconds                 97535: Reviewed imaging with a spine model; reviewed disc pressures in various positions as it relates to her upcoming [place] vacation; practical logroll; practical golfers and diagonal squat lifts; reviewed examination findings and data on home exercise program                                                                                                     PATIENT EDUCATION:  Education details: See above Person educated: Patient Education method: Explanation, Demonstration, Tactile cues, Verbal cues, and Handouts Education comprehension: verbalized understanding,  returned demonstration, verbal cues required, tactile cues required, and needs further education  HOME EXERCISE PROGRAM: Access Code: [email or web address] URL: [email or web address] Date: 09/23/2023 Prepared by: Lamar Ivory  Exercises - Standing Lumbar Extension at Wall - Forearms  - 5 x daily - 7 x weekly - 1 sets - 5 reps - 3 seconds hold - Standing Scapular Retraction  - 5 x daily - 7 x weekly - 1 sets - 5 reps - 5 second hold - Slant Board Gastrocnemius Stretch  - 2 x daily - 7 x weekly - 1 sets - 5 reps - 60 hold - Prone Hip Extension  - 2 x daily - 7 x weekly - 1-2 sets - 10 reps - 3-5 seconds hold  Updated by: Susannah Daring 09/29/2023 - Prone Press Up  - 2 x daily - 7 x weekly - 2 sets - 10 reps - 2-5 hold - Standing Hip Extension with Counter Support  - 1 x daily - 7 x weekly - 3 sets - 10 reps ASSESSMENT:  CLINICAL IMPRESSION:  Patient arrives to  session with minor complaints of increased pain with HEP, to include soreness that feels like her muscles may be working too hard. PT educated on performing the standing extension exercise every other day if it feels like she is becoming too sore. PT also gave the option of performing prone modified cobra stretch if the standing extension does not improve. Patient endorsed interest in potential for dry needling for L hip and PT discussed options, cost, and scheduling with patient. Patient will continue to benefit from skilled PT to improve upon above noted deficits.    OBJECTIVE IMPAIRMENTS: Abnormal gait, decreased activity tolerance, decreased endurance, decreased knowledge of condition, difficulty walking, decreased ROM, decreased strength, decreased safety awareness, impaired perceived functional ability, improper body mechanics, postural dysfunction, and pain.   ACTIVITY LIMITATIONS: carrying, lifting, bending, sitting, standing, sleeping, bed mobility, and locomotion level  PARTICIPATION LIMITATIONS: community activity  PERSONAL FACTORS: Rt RTC repair 2015 may also affect this patient's functional outcome.   REHAB POTENTIAL: Good  CLINICAL DECISION MAKING: Stable/uncomplicated  EVALUATION COMPLEXITY: Moderate   GOALS: Goals reviewed with patient? Yes  SHORT TERM GOALS: Target date: 10/21/2023  Madelin will be independent with her day 1 home exercise program Baseline: Started 09/23/2023 Goal status: Ongoing 09/24/2023  2.  [name] will have improved postural awareness and ability to implement this into all static and dynamic activities Baseline: Core body mechanics with flexion in a seated position when dropping things and picking up her purse at the end of her treatment today Goal status: Ongoing 09/24/2023  3.  Improve spine strength as assessed by 30 seconds of pain-free prone superwoman test position Baseline: 15 seconds with pain Goal status: INITIAL   LONG TERM GOALS: Target  date: 11/18/2023  Improve patient specific functional score to at least 7 Baseline: 4 Goal status: INITIAL  2.  [name] will report low back pain consistently 3/10 or less on the numeric pain rating scale with no reports of hip or lower extremity pain Baseline: 3-7/10 with left hip and lower extremity pain as distal as the left lateral calf Goal status: INITIAL  3.  Improve spine strength as assessed by 60 seconds of pain-free superwoman testing Baseline: 15 seconds with pain Goal status: INITIAL  4.  [name] will be independent with her daily maintenance home exercise program at discharge Baseline: Started 09/23/2023 Goal status: INITIAL   PLAN:  PT FREQUENCY: 2x/week  PT DURATION: 8 weeks  PLANNED  INTERVENTIONS: 97750- Physical Performance Testing, 97110-Therapeutic exercises, 97530- Therapeutic activity, [id number]- Neuromuscular re-education, 97535- Self Care, [id number]- Manual therapy, [id number]- Electrical stimulation (unattended), [phone]- Traction (mechanical), [phone] (1-2 muscles), [phone] (3+ muscles)- Dry Needling, Patient/Family education, Spinal mobilization, and Cryotherapy.  PLAN FOR NEXT SESSION: review HEP and changes made,  LE strengthening, biomechanics for functional movements (squats, carrying, lifting, etc.).  Consider traction if peripheral symptoms persist after 3 to 4 weeks of appropriate physical therapy interventions.   Susannah Daring, PT, DPT 09/29/23 3:37 PM

## 2023-09-29 ENCOUNTER — Ambulatory Visit

## 2023-09-29 DIAGNOSIS — R293 Abnormal posture: Secondary | ICD-10-CM | POA: Diagnosis not present

## 2023-09-29 DIAGNOSIS — M5416 Radiculopathy, lumbar region: Secondary | ICD-10-CM | POA: Diagnosis not present

## 2023-09-29 DIAGNOSIS — M6281 Muscle weakness (generalized): Secondary | ICD-10-CM

## 2023-09-29 DIAGNOSIS — M25552 Pain in left hip: Secondary | ICD-10-CM

## 2023-09-29 DIAGNOSIS — R262 Difficulty in walking, not elsewhere classified: Secondary | ICD-10-CM | POA: Diagnosis not present

## 2023-10-02 ENCOUNTER — Telehealth (HOSPITAL_BASED_OUTPATIENT_CLINIC_OR_DEPARTMENT_OTHER): Payer: Self-pay

## 2023-10-02 ENCOUNTER — Telehealth: Payer: Self-pay | Admitting: Physician Assistant

## 2023-10-02 NOTE — Telephone Encounter (Signed)
 Spoke to patient and she will be in Monday for her Evenity   then we will call the pharmacy to discuss the price after

## 2023-10-02 NOTE — Telephone Encounter (Signed)
 Pt called requesting a call back from [facility] concerning billing for injection. Pt states to have a few question. Please call pt at [phone].

## 2023-10-05 ENCOUNTER — Ambulatory Visit

## 2023-10-05 DIAGNOSIS — M810 Age-related osteoporosis without current pathological fracture: Secondary | ICD-10-CM

## 2023-10-05 MED ORDER — ROMOSOZUMAB-AQQG 105 MG/1.17ML ~~LOC~~ SOSY: 210.0000 mg | PREFILLED_SYRINGE | SUBCUTANEOUS | Status: AC

## 2023-10-05 NOTE — Progress Notes (Unsigned)
 Patient was in today for Evenity  injection she tolerated it well  #3

## 2023-10-06 DIAGNOSIS — H401131 Primary open-angle glaucoma, bilateral, mild stage: Secondary | ICD-10-CM | POA: Diagnosis not present

## 2023-10-06 DIAGNOSIS — H2513 Age-related nuclear cataract, bilateral: Secondary | ICD-10-CM | POA: Diagnosis not present

## 2023-10-06 DIAGNOSIS — H35372 Puckering of macula, left eye: Secondary | ICD-10-CM | POA: Diagnosis not present

## 2023-10-06 NOTE — Therapy (Signed)
 OUTPATIENT PHYSICAL THERAPY THORACOLUMBAR TREATMENT   Patient Name: [name] MRN: [id number] DOB:[birth year], 68 y.o., female Today's Date: 10/07/2023  END OF SESSION:  PT End of Session - 10/07/23 1337     Visit Number 4    Number of Visits 16    Date for PT Re-Evaluation 11/18/23    Authorization - Number of Visits 16    Progress Note Due on Visit 10    PT Start Time 1343    PT Stop Time 1434    PT Time Calculation (min) 51 min    Activity Tolerance Patient tolerated treatment well;No increased pain    Behavior During Therapy WFL for tasks assessed/performed             Past Medical History:  Diagnosis Date   Anxiety    very high   GERD (gastroesophageal reflux disease)    Glaucoma    Heart murmur    Pt saw Cardiologist when she was in her 57s, not since.   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   Osteoporosis    Scoliosis    Past Surgical History:  Procedure Laterality Date   CESAREAN SECTION  1988   SHOULDER ARTHROSCOPY WITH SUBACROMIAL DECOMPRESSION AND OPEN ROTATOR C Right 12/09/2013   Procedure: RIGHT SHOULDER ARTHROSCOPY WITH SUBACROMIAL DECOMPRESSION AND MINI OPEN ROTATOR CUFF REPAIR, AND BICEPS TENDODESIS;  Surgeon: Elspeth JONELLE Her, MD;  Location: [facility] OR;  Service: Orthopedics;  Laterality: Right;   TONSILLECTOMY     Patient Active Problem List   Diagnosis Date Noted   Hyperlipidemia 03/25/2022   Herpes labialis 02/16/2021   Gastroesophageal reflux disease without esophagitis 02/15/2018   Lactose intolerance 02/15/2018   History of vitamin D  deficiency 02/15/2018   Age-related osteoporosis without current pathological fracture 02/15/2018   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   Spondylosis 05/26/2007   Disorder of gallbladder 03/11/2007    PCP: Norleen JAYSON Jobs, MD  REFERRING PROVIDER: Elspeth Parker, MD  REFERRING DIAG: M51.16 (ICD-10-CM) - Intervertebral disc disorders with radiculopathy,  lumbar region  Rationale for Evaluation and Treatment: Rehabilitation  THERAPY DIAG:  Abnormal posture  Difficulty in walking, not elsewhere classified  Pain in left hip  Muscle weakness (generalized)  Radiculopathy, lumbar region  ONSET DATE: Bad pain started in early March (6th) 2025 with standing and walking  SUBJECTIVE:                                                                                                                                                                                           SUBJECTIVE  STATEMENT: I'm having a hard time. Patient noting lots of pain with HEP activities, especially with activities with arching/back extension.    PERTINENT HISTORY:  Rt RTC repair 2015  PAIN:  Are you having pain? Yes: NPRS scale: 3-7/10 this week  Pain location: Low back and left lower extremity as distal as the lateral calf Pain description: Ache, sore, can be sharp Aggravating factors: Prolonged standing and walking Relieving factors: Change of position  PRECAUTIONS: Back  RED FLAGS: None   WEIGHT BEARING RESTRICTIONS: No  FALLS:  Has patient fallen in last 6 months? No  LIVING ENVIRONMENT: Lives with: lives with their family and lives with their spouse Lives in: House/apartment Stairs: No problem Has following equipment at home: None  OCCUPATION: Works 1 day a week at [employer]  PLOF: Independent  PATIENT GOALS: Get back to where she was before the flare-up in March  NEXT MD VISIT: Dr Eldonna 10/26/2023  OBJECTIVE:  Note: Objective measures were completed at Evaluation unless otherwise noted.  DIAGNOSTIC FINDINGS:  IMPRESSION: 1. Asymmetric marrow edema within the left L5 pedicle and transverse process, corresponding with a probable nondisplaced fracture on the recent abdominopelvic CT. No displaced fracture or other acute osseous findings demonstrated. 2. Chronic spondylosis at L4-5 with asymmetric disc bulging and endplate  osteophytes asymmetric to the left. There is mild narrowing of the left lateral recess and mild to moderate left foraminal narrowing without definite nerve root impingement. 3. Mild left foraminal narrowing at L5-S1 without definite nerve root impingement. 4. No spinal stenosis or definite nerve root impingement. 5. Convex left thoracolumbar scoliosis.  PATIENT SURVEYS:  PSFS: THE PATIENT SPECIFIC FUNCTIONAL SCALE  Place score of 0-10 (0 = unable to perform activity and 10 = able to perform activity at the same level as before injury or problem)  Activity Date: 09/23/2023    Standing 4/10    2.  Showering 4/10    3.  Walking 4/10    4.      Total Score 4      Total Score = Sum of activity scores/number of activities  Minimally Detectable Change: 3 points (for single activity); 2 points (for average score)  [facility] (nd). The Patient Specific Functional Scale . Retrieved from [email or web address]   COGNITION: Overall cognitive status: Within functional limits for tasks assessed     SENSATION: Daily left lower extremity pain and paresthesias as distal as the lateral calf, mostly lateral hip  MUSCLE LENGTH: Hamstrings: Right 55 deg; Left 55 deg   POSTURE: rounded shoulders, forward head, and decreased lumbar lordosis  LUMBAR ROM:   AROM 09/23/2023  Flexion   Extension 15  Right lateral flexion   Left lateral flexion 40  Right rotation 30  Left rotation    (Blank rows = not tested)  LOWER EXTREMITY ROM:     Passive  Left/Right 09/23/2023   Hip flexion 110/115   Hip extension    Hip abduction    Hip adduction    Hip internal rotation 22/14   Hip external rotation 45/46   Knee flexion    Knee extension    Ankle dorsiflexion 10/7   Ankle plantarflexion    Ankle inversion    Ankle eversion     (Blank rows = not tested)  STRENGTH     09/23/2023   Hip flexion    Hip extension    Hip abduction     Hip adduction    Hip internal rotation    Hip  external rotation    Knee flexion    Knee extension    Ankle dorsiflexion    Ankle plantarflexion    Ankle inversion    Ankle eversion    Lumbar extension (superwoman test) 15 seconds with pain (Goal is 30 to 60 seconds without pain)    (Blank rows = not tested)  GAIT: Distance walked: 50 feet Assistive device utilized: None Level of assistance: Complete Independence Comments: [name] is currently walking 4 days a week for 20 to 30 minutes for exercise.  TREATMENT DATE:  10/07/2023 TherEx:  Lumbar rollouts with green theraball 3x45s  Seated figure 4 stretch 3x30s each leg  Seated hamstring stretch 2x30s each leg  Supine lumbar rotations 1x10 each direction with 5s hold  Paloff press with green TB 2x10 each direction   Neuro Re-Ed:  Seated sciatic nerve glide with Lt LE 1x10  Supine sciatic nerve glide with Lt LE 1x10  PT educated patient on nerve sliders vs tensioners and how they help with radiating pain  Self-Care:  PT discussed pain vs soreness with HEP, alterations to HEP, connections between spine and hip musculature, and change of focus to core/hip strengthening as well as mobility   09/29/2023 TherEx:  Assessed prone leg extensions; applied pillow under hips to assist with pain, but continuing to experience middle low back pain  Standing lumbar extension  Causing pain that feels like a pulled muscle in middle low back; PT discussed performing exercise every other day to see if that helps  Modified cobra pose with 2-3s hold at top for 2x10 PT educating on importance of form and only going through ROM that is pain-free Seated lumbar rollouts with green theraball 3x30s  Slant board gastroc stretch 3x30s  Reviewed performing gastroc stretch at home with 6 step in parallel bars  Standing rows with green TB 3x10 with 2-3s hold (rated easy-to-medium)  Standing alternating hip extension 3x8 each leg  Intermittent L hip pain  noted, but improved with rest   Self-Care: PT discussed dry needling options, pain levels with mobility, changes to HEP, progressions/regressions for HEP, and how LE muscular strength can affect the back    09/24/2023 Limited range lumbar extension AROM, hands-on gluteals and gentle extension 10 x 3 seconds Scapular retraction/shoulder blade pinches 10 x 5 seconds Upper heel cords box with toe slightly in 60 seconds   Lower heel cords box with toe slightly in 60 seconds    Prone alternating hip extensions 10 x 3 seconds      Yoga Bridge 10 x 5 seconds Hip hike at counter top and in door frame 10 x 3 seconds each            97535: Practical logroll; practical golfers and diagonal squat lifts; reviewed changes to HEP   09/23/2023 Limited range lumbar extension AROM, hands-on gluteals and gentle extension 10 x 3 seconds Scapular retraction/shoulder blade pinches 10 x 5 seconds Upper heel cords box with toe slightly in 60 seconds      Prone alternating hip extensions 10 x 3 seconds                 97535: Reviewed imaging with a spine model; reviewed disc pressures in various positions as it relates to her upcoming [place] vacation; practical logroll; practical golfers and diagonal squat lifts; reviewed examination findings and data on home exercise program  PATIENT EDUCATION:  Education details: See above Person educated: Patient Education method: Explanation, Demonstration, Tactile cues, Verbal cues, and Handouts Education comprehension: verbalized understanding, returned demonstration, verbal cues required, tactile cues required, and needs further education  HOME EXERCISE PROGRAM: Access Code: [email or web address] URL: [email or web address] Date: 10/07/2023 Prepared by: Susannah Daring  Exercises - Standing Scapular Retraction  - 5 x daily - 7 x weekly - 1 sets - 5 reps - 2-3s  hold - Slant Board Gastrocnemius Stretch  - 2 x daily - 7 x weekly - 1 sets - 5 reps - 60 hold - Slant Board Soleus Stretch  - 2 x daily - 7 x weekly - 1 sets - 5 reps - 60 hold - Standing Hip Extension with Counter Support  - 1 x daily - 7 x weekly - 3 sets - 10 reps - Supine Lower Trunk Rotation  - 1 x daily - 7 x weekly - 1-2 sets - 10 reps - 5 hold - Seated Figure 4 Piriformis Stretch  - 1 x daily - 7 x weekly - 3 sets - 30-40s hold - Seated 3 Way Exercise Ball Roll Out Stretch  - 1 x daily - 7 x weekly - 1-2 sets - 30-45s hold - Seated Slump Nerve Glide  - 1 x daily - 7 x weekly - 1-2 sets - 10 reps - 2s hold - Supine Sciatic Nerve Glide  - 1 x daily - 7 x weekly - 1-2 sets - 10 reps - 2s hold - Seated Hamstring Stretch  - 1 x daily - 7 x weekly - 1-2 sets - 30-45s hold - Supine Hamstring Stretch with Strap  - 1 x daily - 7 x weekly - 1-2 sets - 30-45s hold  ASSESSMENT:  CLINICAL IMPRESSION: Patient arrives to session with complaints of increased pain with HEP to include all exercises that involve back extension. Patient has become discouraged with the consistent amount of pain and discomfort. PT discussed alterations that can be made to assist with pain and discomfort and shifted focus to include gentle lumbar mobility, hip and thigh musculature flexibility, nerve glides, and core strengthening. Patient tolerated all activities well. PT updated HEP to include flexibility and nerve glides from today's session. Patient will continue to benefit from skilled PT.   OBJECTIVE IMPAIRMENTS: Abnormal gait, decreased activity tolerance, decreased endurance, decreased knowledge of condition, difficulty walking, decreased ROM, decreased strength, decreased safety awareness, impaired perceived functional ability, improper body mechanics, postural dysfunction, and pain.   ACTIVITY LIMITATIONS: carrying, lifting, bending, sitting, standing, sleeping, bed mobility, and locomotion level  PARTICIPATION  LIMITATIONS: community activity  PERSONAL FACTORS: Rt RTC repair 2015 may also affect this patient's functional outcome.   REHAB POTENTIAL: Good  CLINICAL DECISION MAKING: Stable/uncomplicated  EVALUATION COMPLEXITY: Moderate   GOALS: Goals reviewed with patient? Yes  SHORT TERM GOALS: Target date: 10/21/2023  Madelin will be independent with her day 1 home exercise program Baseline: Started 09/23/2023 Goal status: Ongoing 09/24/2023  2.  [name] will have improved postural awareness and ability to implement this into all static and dynamic activities Baseline: Core body mechanics with flexion in a seated position when dropping things and picking up her purse at the end of her treatment today Goal status: Ongoing 09/24/2023  3.  Improve spine strength as assessed by 30 seconds of pain-free prone superwoman test position Baseline: 15 seconds with pain Goal status: INITIAL   LONG TERM GOALS: Target date: 11/18/2023  Improve patient specific functional score to at least 7  Baseline: 4 Goal status: INITIAL  2.  [name] will report low back pain consistently 3/10 or less on the numeric pain rating scale with no reports of hip or lower extremity pain Baseline: 3-7/10 with left hip and lower extremity pain as distal as the left lateral calf Goal status: INITIAL  3.  Improve spine strength as assessed by 60 seconds of pain-free superwoman testing Baseline: 15 seconds with pain Goal status: INITIAL  4.  [name] will be independent with her daily maintenance home exercise program at discharge Baseline: Started 09/23/2023 Goal status: INITIAL   PLAN:  PT FREQUENCY: 2x/week  PT DURATION: 8 weeks  PLANNED INTERVENTIONS: 97750- Physical Performance Testing, 97110-Therapeutic exercises, 97530- Therapeutic activity, 97112- Neuromuscular re-education, 97535- Self Care, 02859- Manual therapy, [id number]- Electrical stimulation (unattended), 02987- Traction (mechanical), 20560 (1-2 muscles), 20561 (3+  muscles)- Dry Needling, Patient/Family education, Spinal mobilization, and Cryotherapy.  PLAN FOR NEXT SESSION: core strengthening, isometric back extension exercises (?), hip and thigh musculature flexibility, review HEP and changes made,  LE strengthening, biomechanics for functional movements (squats, carrying, lifting, etc.).  Consider traction if peripheral symptoms persist after 3 to 4 weeks of appropriate physical therapy interventions.   Susannah Daring, PT, DPT 10/07/23 2:46 PM

## 2023-10-07 ENCOUNTER — Ambulatory Visit: Admitting: Orthopedic Surgery

## 2023-10-07 ENCOUNTER — Ambulatory Visit

## 2023-10-07 DIAGNOSIS — M6281 Muscle weakness (generalized): Secondary | ICD-10-CM | POA: Diagnosis not present

## 2023-10-07 DIAGNOSIS — R262 Difficulty in walking, not elsewhere classified: Secondary | ICD-10-CM | POA: Diagnosis not present

## 2023-10-07 DIAGNOSIS — M5416 Radiculopathy, lumbar region: Secondary | ICD-10-CM | POA: Diagnosis not present

## 2023-10-07 DIAGNOSIS — M25552 Pain in left hip: Secondary | ICD-10-CM

## 2023-10-07 DIAGNOSIS — R293 Abnormal posture: Secondary | ICD-10-CM | POA: Diagnosis not present

## 2023-10-08 ENCOUNTER — Encounter: Payer: Self-pay | Admitting: Cardiology

## 2023-10-08 ENCOUNTER — Ambulatory Visit: Attending: Cardiology | Admitting: Cardiology

## 2023-10-08 VITALS — BP 108/72 | HR 85 | Ht 63.0 in | Wt 137.4 lb

## 2023-10-08 DIAGNOSIS — I771 Stricture of artery: Secondary | ICD-10-CM

## 2023-10-08 DIAGNOSIS — E7800 Pure hypercholesterolemia, unspecified: Secondary | ICD-10-CM

## 2023-10-08 NOTE — Patient Instructions (Signed)
 Medication Instructions:  Continue same medications  Lab Work: None ordered  Testing/Procedures: Calcium Score  Follow-Up: At [facility], you and your health needs are our priority.  As part of our continuing mission to provide you with exceptional heart care, our providers are all part of one team.  This team includes your primary Cardiologist (physician) and Advanced Practice Providers or APPs (Physician Assistants and Nurse Practitioners) who all work together to provide you with the care you need, when you need it.  Your next appointment:  As needed     Provider:  Dr.Crenshaw   We recommend signing up for the patient portal called MyChart.  Sign up information is provided on this After Visit Summary.  MyChart is used to connect with patients for Virtual Visits (Telemedicine).  Patients are able to view lab/test results, encounter notes, upcoming appointments, etc.  Non-urgent messages can be sent to your provider as well.   To learn more about what you can do with MyChart, go to [email or web address].

## 2023-10-09 ENCOUNTER — Ambulatory Visit (INDEPENDENT_AMBULATORY_CARE_PROVIDER_SITE_OTHER): Admitting: Rehabilitative and Restorative Service Providers"

## 2023-10-09 ENCOUNTER — Encounter: Payer: Self-pay | Admitting: Rehabilitative and Restorative Service Providers"

## 2023-10-09 DIAGNOSIS — M5416 Radiculopathy, lumbar region: Secondary | ICD-10-CM | POA: Diagnosis not present

## 2023-10-09 DIAGNOSIS — M6281 Muscle weakness (generalized): Secondary | ICD-10-CM | POA: Diagnosis not present

## 2023-10-09 DIAGNOSIS — R262 Difficulty in walking, not elsewhere classified: Secondary | ICD-10-CM

## 2023-10-09 DIAGNOSIS — R293 Abnormal posture: Secondary | ICD-10-CM

## 2023-10-09 NOTE — Therapy (Signed)
 OUTPATIENT PHYSICAL THERAPY THORACOLUMBAR TREATMENT   Patient Name: KLAIRA PESCI MRN: [id number] DOB:[birth year], 68 y.o., female Today's Date: 10/09/2023  END OF SESSION:  PT End of Session - 10/09/23 1515     Visit Number 5    Number of Visits 16    Date for PT Re-Evaluation 11/18/23    Authorization - Number of Visits 16    Progress Note Due on Visit 10    PT Start Time 1514    PT Stop Time 1557    PT Time Calculation (min) 43 min    Activity Tolerance Patient tolerated treatment well;No increased pain;Patient limited by pain    Behavior During Therapy [facility] for tasks assessed/performed              Past Medical History:  Diagnosis Date   Anxiety    very high   GERD (gastroesophageal reflux disease)    Glaucoma    Heart murmur    Pt saw Cardiologist when she was in her 47s, not since.   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   Osteoporosis    Scoliosis    Past Surgical History:  Procedure Laterality Date   CESAREAN SECTION  1988   SHOULDER ARTHROSCOPY WITH SUBACROMIAL DECOMPRESSION AND OPEN ROTATOR C Right 12/09/2013   Procedure: RIGHT SHOULDER ARTHROSCOPY WITH SUBACROMIAL DECOMPRESSION AND MINI OPEN ROTATOR CUFF REPAIR, AND BICEPS TENDODESIS;  Surgeon: Elspeth JONELLE Her, MD;  Location: [facility] OR;  Service: Orthopedics;  Laterality: Right;   TONSILLECTOMY     Patient Active Problem List   Diagnosis Date Noted   Hyperlipidemia 03/25/2022   Herpes labialis 02/16/2021   Gastroesophageal reflux disease without esophagitis 02/15/2018   Lactose intolerance 02/15/2018   History of vitamin D  deficiency 02/15/2018   Age-related osteoporosis without current pathological fracture 02/15/2018   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   Spondylosis 05/26/2007   Disorder of gallbladder 03/11/2007    PCP: Norleen JAYSON Jobs, MD  REFERRING PROVIDER: Elspeth Parker, MD  REFERRING DIAG: M51.16 (ICD-10-CM) -  Intervertebral disc disorders with radiculopathy, lumbar region  Rationale for Evaluation and Treatment: Rehabilitation  THERAPY DIAG:  Abnormal posture  Difficulty in walking, not elsewhere classified  Muscle weakness (generalized)  Radiculopathy, lumbar region  ONSET DATE: Bad pain started in early March (6th) 2025 with standing and walking  SUBJECTIVE:  SUBJECTIVE STATEMENT: [name] just started taking Gabapentin  2 days ago.  Most of [name]'s symptoms are low back and gluteal.  Still some lateral calf pain.  Back is an ache.  Hip/gluteal and calf are deep/sharp/quick pains.   PMH Rt RTC repair 2015  PAIN:  Are you having pain? Yes: NPRS scale: 1-5/10 low back; left gluteals 0-6/10; left lateral calf 0-4/10 (was 3-7/10)  Pain location: Low back and left lower extremity as distal as the lateral calf Pain description: Ache, sore, can be sharp Aggravating factors: Prolonged standing and walking Relieving factors: Change of position  PRECAUTIONS: Back  RED FLAGS: None   WEIGHT BEARING RESTRICTIONS: No  FALLS:  Has patient fallen in last 6 months? No  LIVING ENVIRONMENT: Lives with: lives with their family and lives with their spouse Lives in: House/apartment Stairs: No problem Has following equipment at home: None  OCCUPATION: Works 1 day a week at [employer]  PLOF: Independent  PATIENT GOALS: Get back to where she was before the flare-up in March  NEXT MD VISIT: Dr Eldonna 10/26/2023  OBJECTIVE:  Note: Objective measures were completed at Evaluation unless otherwise noted.  DIAGNOSTIC FINDINGS:  IMPRESSION: 1. Asymmetric marrow edema within the left L5 pedicle and transverse process, corresponding with a probable nondisplaced fracture on the recent abdominopelvic CT. No  displaced fracture or other acute osseous findings demonstrated. 2. Chronic spondylosis at L4-5 with asymmetric disc bulging and endplate osteophytes asymmetric to the left. There is mild narrowing of the left lateral recess and mild to moderate left foraminal narrowing without definite nerve root impingement. 3. Mild left foraminal narrowing at L5-S1 without definite nerve root impingement. 4. No spinal stenosis or definite nerve root impingement. 5. Convex left thoracolumbar scoliosis.  PATIENT SURVEYS:  PSFS: THE PATIENT SPECIFIC FUNCTIONAL SCALE  Place score of 0-10 (0 = unable to perform activity and 10 = able to perform activity at the same level as before injury or problem)  Activity Date: 09/23/2023    Standing 4/10    2.  Showering 4/10    3.  Walking 4/10    4.      Total Score 4      Total Score = Sum of activity scores/number of activities  Minimally Detectable Change: 3 points (for single activity); 2 points (for average score)  [facility] (nd). The Patient Specific Functional Scale . Retrieved from [email or web address]   COGNITION: Overall cognitive status: Within functional limits for tasks assessed     SENSATION: Daily left lower extremity pain and paresthesias as distal as the lateral calf, mostly lateral hip  MUSCLE LENGTH: 10/09/2023: Hamstrings: Right 60 deg; Left 60 deg  Eval: Hamstrings: Right 55 deg; Left 55 deg   POSTURE: rounded shoulders, forward head, and decreased lumbar lordosis  LUMBAR ROM:   AROM 09/23/2023 8/?/2025  Flexion    Extension 15   Right lateral flexion    Left lateral flexion 40   Right rotation 30   Left rotation     (Blank rows = not tested)  LOWER EXTREMITY ROM:     Passive  Left/Right 09/23/2023 Left/Right 10/09/2023  Hip flexion 110/115 115/120  Hip extension    Hip abduction    Hip adduction    Hip internal rotation 22/14 24/14  Hip external  rotation 45/46 43/45  Knee flexion    Knee extension    Ankle dorsiflexion 10/7   Ankle plantarflexion    Ankle inversion    Ankle eversion     (  Blank rows = not tested)  STRENGTH     09/23/2023   Hip flexion    Hip extension    Hip abduction    Hip adduction    Hip internal rotation    Hip external rotation    Knee flexion    Knee extension    Ankle dorsiflexion    Ankle plantarflexion    Ankle inversion    Ankle eversion    Lumbar extension (superwoman test) 15 seconds with pain (Goal is 30 to 60 seconds without pain)    (Blank rows = not tested)  GAIT: Distance walked: 50 feet Assistive device utilized: None Level of assistance: Complete Independence Comments: [name] is currently walking 4 days a week for 20 to 30 minutes for exercise.  TREATMENT DATE:  10/09/2023 Limited range lumbar extension AROM, hands-on gluteals and gentle hip extension 10 x 3 seconds Scapular retraction/shoulder blade pinches 10 x 5 seconds Quadruped leg extension 10 x 3 seconds Prone alternating hip extensions 10 x 3 seconds      Yoga Bridge 10 x 5 seconds           97535: Practical logroll; practical golfers and diagonal squat lifts; objective reassessment and reviewed changes to HEP   10/07/2023 TherEx:  Lumbar rollouts with green theraball 3x45s  Seated figure 4 stretch 3x30s each leg  Seated hamstring stretch 2x30s each leg  Supine lumbar rotations 1x10 each direction with 5s hold  Paloff press with green TB 2x10 each direction   Neuro Re-Ed:  Seated sciatic nerve glide with Lt LE 1x10  Supine sciatic nerve glide with Lt LE 1x10  PT educated patient on nerve sliders vs tensioners and how they help with radiating pain  Self-Care:  PT discussed pain vs soreness with HEP, alterations to HEP, connections between spine and hip musculature, and change of focus to core/hip strengthening as well as mobility    09/29/2023 TherEx:  Assessed prone leg extensions; applied pillow under hips  to assist with pain, but continuing to experience middle low back pain  Standing lumbar extension  Causing pain that feels like a pulled muscle in middle low back; PT discussed performing exercise every other day to see if that helps  Modified cobra pose with 2-3s hold at top for 2x10 PT educating on importance of form and only going through ROM that is pain-free Seated lumbar rollouts with green theraball 3x30s  Slant board gastroc stretch 3x30s  Reviewed performing gastroc stretch at home with 6 step in parallel bars  Standing rows with green TB 3x10 with 2-3s hold (rated easy-to-medium)  Standing alternating hip extension 3x8 each leg  Intermittent L hip pain noted, but improved with rest   Self-Care: PT discussed dry needling options, pain levels with mobility, changes to HEP, progressions/regressions for HEP, and how LE muscular strength can affect the back                                 PATIENT EDUCATION:  Education details: See above Person educated: Patient Education method: Explanation, Demonstration, Tactile cues, Verbal cues, and Handouts Education comprehension: verbalized understanding, returned demonstration, verbal cues required, tactile cues required, and needs further education  HOME EXERCISE PROGRAM: Access Code: [email or web address] URL: [email or web address] Date: 10/09/2023 Prepared by: Lamar Ivory  Exercises - Standing Scapular Retraction  - 5 x daily - 7 x weekly - 1 sets - 5 reps - 5 seconds hold - Standing Lumbar Extension  at Wall - Forearms  - 5 x daily - 7 x weekly - 1 sets - 5 reps - 3 seconds hold - Yoga Bridge  - 2 x daily - 7 x weekly - 1 sets - 10 reps - 5 seconds hold - Quadruped Alternating Leg Extensions  - 2 x daily - 7 x weekly - 1 sets - 10 reps - 5 seconds hold  ASSESSMENT:  CLINICAL IMPRESSION: [name] mentioned that her pain has definitely been more noticeable since her steroid Dosepak has worn off.  She started gabapentin  2 days ago  and is hoping that she can get her back to where she was in mid July as far as her pain management.  We did a mini reassessment today which showed [name] is still quite flexible and that, as noted at evaluation, core strength activities to reduce her L4-5 disc will be the focus of her supervised physical therapy.  Her prognosis remains good to meet long-term goals established at evaluation.  OBJECTIVE IMPAIRMENTS: Abnormal gait, decreased activity tolerance, decreased endurance, decreased knowledge of condition, difficulty walking, decreased ROM, decreased strength, decreased safety awareness, impaired perceived functional ability, improper body mechanics, postural dysfunction, and pain.   ACTIVITY LIMITATIONS: carrying, lifting, bending, sitting, standing, sleeping, bed mobility, and locomotion level  PARTICIPATION LIMITATIONS: community activity  PERSONAL FACTORS: Rt RTC repair 2015 may also affect this patient's functional outcome.   REHAB POTENTIAL: Good  CLINICAL DECISION MAKING: Stable/uncomplicated  EVALUATION COMPLEXITY: Moderate   GOALS: Goals reviewed with patient? Yes  SHORT TERM GOALS: Target date: 10/21/2023  Madelin will be independent with her day 1 home exercise program Baseline: Started 09/23/2023 Goal status: Ongoing 10/09/2023  2.  [name] will have improved postural awareness and ability to implement this into all static and dynamic activities Baseline: Core body mechanics with flexion in a seated position when dropping things and picking up her purse at the end of her treatment today Goal status: Ongoing 10/09/2023  3.  Improve spine strength as assessed by 30 seconds of pain-free prone superwoman test position Baseline: 15 seconds with pain Goal status: INITIAL   LONG TERM GOALS: Target date: 11/18/2023  Improve patient specific functional score to at least 7 Baseline: 4 Goal status: INITIAL  2.  [name] will report low back pain consistently 3/10 or less on the  numeric pain rating scale with no reports of hip or lower extremity pain Baseline: 3-7/10 with left hip and lower extremity pain as distal as the left lateral calf Goal status: On Going 10/09/2023  3.  Improve spine strength as assessed by 60 seconds of pain-free superwoman testing Baseline: 15 seconds with pain Goal status: INITIAL  4.  [name] will be independent with her daily maintenance home exercise program at discharge Baseline: Started 09/23/2023 Goal status: INITIAL   PLAN:  PT FREQUENCY: 2x/week  PT DURATION: 8 weeks  PLANNED INTERVENTIONS: 97750- Physical Performance Testing, 97110-Therapeutic exercises, 97530- Therapeutic activity, 97112- Neuromuscular re-education, 97535- Self Care, 02859- Manual therapy, [id number]- Electrical stimulation (unattended), 02987- Traction (mechanical), 20560 (1-2 muscles), 20561 (3+ muscles)- Dry Needling, Patient/Family education, Spinal mobilization, and Cryotherapy.  PLAN FOR NEXT SESSION: Postural/low back strengthening, isometric back extension exercises (?), biomechanics for functional movements (squats, carrying, lifting, etc.).  Consider traction if peripheral symptoms persist after 3 to 4 weeks of appropriate physical therapy interventions.   Myer LELON Ivory PT, MPT 10/09/23 4:22 PM

## 2023-10-13 ENCOUNTER — Ambulatory Visit: Admitting: Rehabilitative and Restorative Service Providers"

## 2023-10-13 ENCOUNTER — Encounter: Payer: Self-pay | Admitting: Rehabilitative and Restorative Service Providers"

## 2023-10-13 DIAGNOSIS — M25552 Pain in left hip: Secondary | ICD-10-CM | POA: Diagnosis not present

## 2023-10-13 DIAGNOSIS — M5416 Radiculopathy, lumbar region: Secondary | ICD-10-CM

## 2023-10-13 DIAGNOSIS — R262 Difficulty in walking, not elsewhere classified: Secondary | ICD-10-CM | POA: Diagnosis not present

## 2023-10-13 DIAGNOSIS — M6281 Muscle weakness (generalized): Secondary | ICD-10-CM | POA: Diagnosis not present

## 2023-10-13 DIAGNOSIS — R293 Abnormal posture: Secondary | ICD-10-CM

## 2023-10-13 NOTE — Therapy (Signed)
 OUTPATIENT PHYSICAL THERAPY THORACOLUMBAR TREATMENT   Patient Name: [name] MRN: [id number] DOB:04/14/1955, 68 y.o., female Today's Date: 10/13/2023  END OF SESSION:  PT End of Session - 10/13/23 1521     Visit Number 6    Number of Visits 16    Date for PT Re-Evaluation 11/18/23    Authorization - Number of Visits 16    Progress Note Due on Visit 10    PT Start Time 1435    PT Stop Time 1519    PT Time Calculation (min) 44 min    Activity Tolerance Patient tolerated treatment well;No increased pain;Patient limited by pain    Behavior During Therapy [facility] for tasks assessed/performed               Past Medical History:  Diagnosis Date   Anxiety    very high   GERD (gastroesophageal reflux disease)    Glaucoma    Heart murmur    Pt saw Cardiologist when she was in her 59s, not since.   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   Osteoporosis    Scoliosis    Past Surgical History:  Procedure Laterality Date   CESAREAN SECTION  1988   SHOULDER ARTHROSCOPY WITH SUBACROMIAL DECOMPRESSION AND OPEN ROTATOR C Right 12/09/2013   Procedure: RIGHT SHOULDER ARTHROSCOPY WITH SUBACROMIAL DECOMPRESSION AND MINI OPEN ROTATOR CUFF REPAIR, AND BICEPS TENDODESIS;  Surgeon: Elspeth JONELLE Her, MD;  Location: [facility] OR;  Service: Orthopedics;  Laterality: Right;   TONSILLECTOMY     Patient Active Problem List   Diagnosis Date Noted   Hyperlipidemia 03/25/2022   Herpes labialis 02/16/2021   Gastroesophageal reflux disease without esophagitis 02/15/2018   Lactose intolerance 02/15/2018   History of vitamin D  deficiency 02/15/2018   Age-related osteoporosis without current pathological fracture 02/15/2018   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   Spondylosis 05/26/2007   Disorder of gallbladder 03/11/2007    PCP: Norleen JAYSON Jobs, MD  REFERRING PROVIDER: Elspeth Parker, MD  REFERRING DIAG: M51.16 (ICD-10-CM) -  Intervertebral disc disorders with radiculopathy, lumbar region  Rationale for Evaluation and Treatment: Rehabilitation  THERAPY DIAG:  Abnormal posture  Difficulty in walking, not elsewhere classified  Muscle weakness (generalized)  Radiculopathy, lumbar region  Pain in left hip  ONSET DATE: Bad pain started in early March (6th) 2025 with standing and walking  SUBJECTIVE:  SUBJECTIVE STATEMENT: [name] notes she is having less pain between doing a few exercises at a time throughout the day (vs 1 long episode) and with her Gabapentin .  Most of [name]'s symptoms are low back and gluteal.  Less frequent and less severe lateral calf pain.  Back is an ache.  Hip/gluteal and calf are deep/sharp/quick pains.   PMH Rt RTC repair 2015  PAIN:  Are you having pain? Yes: NPRS scale: 1-4/10 low back; left gluteals 2-6/10; left lateral calf 0-3/10 infrequent (was 3-7/10)  Pain location: Low back and left lower extremity as distal as the lateral calf Pain description: Ache, sore, can be sharp Aggravating factors: Prolonged standing and walking Relieving factors: Change of position  PRECAUTIONS: Back  RED FLAGS: None   WEIGHT BEARING RESTRICTIONS: No  FALLS:  Has patient fallen in last 6 months? No  LIVING ENVIRONMENT: Lives with: lives with their family and lives with their spouse Lives in: House/apartment Stairs: No problem Has following equipment at home: None  OCCUPATION: Works 1 day a week at [employer]  PLOF: Independent  PATIENT GOALS: Get back to where she was before the flare-up in March  NEXT MD VISIT: Dr Eldonna 10/26/2023  OBJECTIVE:  Note: Objective measures were completed at Evaluation unless otherwise noted.  DIAGNOSTIC FINDINGS:  IMPRESSION: 1. Asymmetric marrow edema within  the left L5 pedicle and transverse process, corresponding with a probable nondisplaced fracture on the recent abdominopelvic CT. No displaced fracture or other acute osseous findings demonstrated. 2. Chronic spondylosis at L4-5 with asymmetric disc bulging and endplate osteophytes asymmetric to the left. There is mild narrowing of the left lateral recess and mild to moderate left foraminal narrowing without definite nerve root impingement. 3. Mild left foraminal narrowing at L5-S1 without definite nerve root impingement. 4. No spinal stenosis or definite nerve root impingement. 5. Convex left thoracolumbar scoliosis.  PATIENT SURVEYS:  PSFS: THE PATIENT SPECIFIC FUNCTIONAL SCALE  Place score of 0-10 (0 = unable to perform activity and 10 = able to perform activity at the same level as before injury or problem)  Activity Date: 09/23/2023    Standing 4/10    2.  Showering 4/10    3.  Walking 4/10    4.      Total Score 4      Total Score = Sum of activity scores/number of activities  Minimally Detectable Change: 3 points (for single activity); 2 points (for average score)  [facility] (nd). The Patient Specific Functional Scale . Retrieved from [email or web address]   COGNITION: Overall cognitive status: Within functional limits for tasks assessed     SENSATION: Daily left lower extremity pain and paresthesias as distal as the lateral calf, mostly lateral hip  MUSCLE LENGTH: 10/09/2023: Hamstrings: Right 60 deg; Left 60 deg  Eval: Hamstrings: Right 55 deg; Left 55 deg   POSTURE: rounded shoulders, forward head, and decreased lumbar lordosis  LUMBAR ROM:   AROM 09/23/2023 8/?/2025  Flexion    Extension 15   Right lateral flexion    Left lateral flexion 40   Right rotation 30   Left rotation     (Blank rows = not tested)  LOWER EXTREMITY ROM:     Passive  Left/Right 09/23/2023 Left/Right 10/09/2023  Hip  flexion 110/115 115/120  Hip extension    Hip abduction    Hip adduction    Hip internal rotation 22/14 24/14  Hip external rotation 45/46 43/45  Knee flexion    Knee extension  Ankle dorsiflexion 10/7   Ankle plantarflexion    Ankle inversion    Ankle eversion     (Blank rows = not tested)  STRENGTH     09/23/2023   Hip flexion    Hip extension    Hip abduction    Hip adduction    Hip internal rotation    Hip external rotation    Knee flexion    Knee extension    Ankle dorsiflexion    Ankle plantarflexion    Ankle inversion    Ankle eversion    Lumbar extension (superwoman test) 15 seconds with pain (Goal is 30 to 60 seconds without pain)    (Blank rows = not tested)  GAIT: Distance walked: 50 feet Assistive device utilized: None Level of assistance: Complete Independence Comments: [name] is currently walking 4 days a week for 20 to 30 minutes for exercise.  TREATMENT DATE:  10/13/2023 Limited range lumbar extension AROM, hands-on gluteals and gentle hip extension 2 sets of 5 x 3 seconds Scapular retraction/shoulder blade pinches 5 x 5 seconds Quadruped leg extension 10 x 3 seconds      Yoga Bridge 10 x 5 seconds       Hip hike 10 x 3 seconds at counter top    Hip hike in door frame (to get rid of lateral lean) 2 sets of 5 for 3 seconds Right side-lie clams with Yellow Thera-Band  3 sets of 10 x 3 seconds  02464: Practical logroll review; practical review of anatomy with spine model and again reviewed imaging   10/09/2023 Limited range lumbar extension AROM, hands-on gluteals and gentle hip extension 10 x 3 seconds Scapular retraction/shoulder blade pinches 10 x 5 seconds Quadruped leg extension 10 x 3 seconds Prone alternating hip extensions 10 x 3 seconds      Yoga Bridge 10 x 5 seconds           [id number]: Practical logroll; practical golfers and diagonal squat lifts; objective reassessment and reviewed changes to HEP   10/07/2023 TherEx:  Lumbar rollouts  with green theraball 3x45s  Seated figure 4 stretch 3x30s each leg  Seated hamstring stretch 2x30s each leg  Supine lumbar rotations 1x10 each direction with 5s hold  Paloff press with green TB 2x10 each direction   Neuro Re-Ed:  Seated sciatic nerve glide with Lt LE 1x10  Supine sciatic nerve glide with Lt LE 1x10  PT educated patient on nerve sliders vs tensioners and how they help with radiating pain  Self-Care:  PT discussed pain vs soreness with HEP, alterations to HEP, connections between spine and hip musculature, and change of focus to core/hip strengthening as well as mobility    PATIENT EDUCATION:  Education details: See above Person educated: Patient Education method: Explanation, Demonstration, Tactile cues, Verbal cues, and Handouts Education comprehension: verbalized understanding, returned demonstration, verbal cues required, tactile cues required, and needs further education  HOME EXERCISE PROGRAM: Access Code: [email or web address] URL: [email or web address] Date: 10/09/2023 Prepared by: Lamar Ivory  Exercises - Standing Scapular Retraction  - 5 x daily - 7 x weekly - 1 sets - 5 reps - 5 seconds hold - Standing Lumbar Extension at Wall - Forearms  - 5 x daily - 7 x weekly - 1 sets - 5 reps - 3 seconds hold - Yoga Bridge  - 2 x daily - 7 x weekly - 1 sets - 10 reps - 5 seconds hold - Quadruped Alternating Leg Extensions  - 2 x daily - 7 x  weekly - 1 sets - 10 reps - 5 seconds hold  ASSESSMENT:  CLINICAL IMPRESSION: [name] reminded me today that she also has a compression fracture at T12-L1.  Combined with her disc at L4-5, this is another reason to avoid flexion with her rehabilitation.  [name] mentioned she is doing better with short, frequent bouts of exercise throughout the day.  She also mentions she can get an increase in her peripheral symptoms if she does too much at once or is too aggressive with her lumbar extension.  We used this as an opportunity to  remind her to avoid activities that increase her peripheral symptoms in that short, frequent home exercise program participation is perfectly acceptable and she should be able to do more at a time as her strength and endurance improve.  OBJECTIVE IMPAIRMENTS: Abnormal gait, decreased activity tolerance, decreased endurance, decreased knowledge of condition, difficulty walking, decreased ROM, decreased strength, decreased safety awareness, impaired perceived functional ability, improper body mechanics, postural dysfunction, and pain.   ACTIVITY LIMITATIONS: carrying, lifting, bending, sitting, standing, sleeping, bed mobility, and locomotion level  PARTICIPATION LIMITATIONS: community activity  PERSONAL FACTORS: Rt RTC repair 2015 may also affect this patient's functional outcome.   REHAB POTENTIAL: Good  CLINICAL DECISION MAKING: Stable/uncomplicated  EVALUATION COMPLEXITY: Moderate   GOALS: Goals reviewed with patient? Yes  SHORT TERM GOALS: Target date: 10/21/2023  Madelin will be independent with her day 1 home exercise program Baseline: Started 09/23/2023 Goal status: Met 10/13/2023  2.  [name] will have improved postural awareness and ability to implement this into all static and dynamic activities Baseline: Core body mechanics with flexion in a seated position when dropping things and picking up her purse at the end of her treatment today Goal status: Ongoing 10/13/2023  3.  Improve spine strength as assessed by 30 seconds of pain-free prone superwoman test position Baseline: 15 seconds with pain Goal status: INITIAL   LONG TERM GOALS: Target date: 11/18/2023  Improve patient specific functional score to at least 7 Baseline: 4 Goal status: INITIAL  2.  [name] will report low back pain consistently 3/10 or less on the numeric pain rating scale with no reports of hip or lower extremity pain Baseline: 3-7/10 with left hip and lower extremity pain as distal as the left lateral  calf Goal status: On Going 10/13/2023  3.  Improve spine strength as assessed by 60 seconds of pain-free superwoman testing Baseline: 15 seconds with pain Goal status: INITIAL  4.  [name] will be independent with her daily maintenance home exercise program at discharge Baseline: Started 09/23/2023 Goal status: INITIAL   PLAN:  PT FREQUENCY: 2x/week  PT DURATION: 8 weeks  PLANNED INTERVENTIONS: 97750- Physical Performance Testing, 97110-Therapeutic exercises, 97530- Therapeutic activity, 97112- Neuromuscular re-education, 97535- Self Care, 02859- Manual therapy, [id number]- Electrical stimulation (unattended), 02987- Traction (mechanical), 20560 (1-2 muscles), 20561 (3+ muscles)- Dry Needling, Patient/Family education, Spinal mobilization, and Cryotherapy.  PLAN FOR NEXT SESSION: Conservative, appropriate postural/low back strength progressions, isometric back extension exercises (?), biomechanics for functional movements (squats, carrying, lifting, etc.).  Consider traction if peripheral symptoms persist after 3 to 4 weeks of appropriate physical therapy interventions.   Myer LELON Ivory PT, MPT 10/13/23 3:35 PM

## 2023-10-14 ENCOUNTER — Ambulatory Visit

## 2023-10-14 ENCOUNTER — Encounter: Payer: Self-pay | Admitting: Family Medicine

## 2023-10-14 DIAGNOSIS — M6281 Muscle weakness (generalized): Secondary | ICD-10-CM

## 2023-10-14 DIAGNOSIS — M5416 Radiculopathy, lumbar region: Secondary | ICD-10-CM | POA: Diagnosis not present

## 2023-10-14 DIAGNOSIS — R293 Abnormal posture: Secondary | ICD-10-CM

## 2023-10-14 DIAGNOSIS — R262 Difficulty in walking, not elsewhere classified: Secondary | ICD-10-CM

## 2023-10-14 DIAGNOSIS — M25552 Pain in left hip: Secondary | ICD-10-CM | POA: Diagnosis not present

## 2023-10-14 MED ORDER — ESTRADIOL 10 MCG VA TABS: ORAL_TABLET | VAGINAL | 2 refills | Status: AC

## 2023-10-14 NOTE — Therapy (Signed)
 OUTPATIENT PHYSICAL THERAPY THORACOLUMBAR TREATMENT   Patient Name: [name] MRN: [id number] DOB:01/29/1956, 68 y.o., female Today's Date: 10/14/2023  END OF SESSION:  PT End of Session - 10/14/23 1134     Visit Number 7    Number of Visits 16    Date for PT Re-Evaluation 11/18/23    Authorization - Number of Visits 16    Progress Note Due on Visit 10    PT Start Time 1142    PT Stop Time 1230    PT Time Calculation (min) 48 min    Activity Tolerance Patient tolerated treatment well    Behavior During Therapy WFL for tasks assessed/performed                Past Medical History:  Diagnosis Date   Anxiety    very high   GERD (gastroesophageal reflux disease)    Glaucoma    Heart murmur    Pt saw Cardiologist when she was in her 63s, not since.   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   Osteoporosis    Scoliosis    Past Surgical History:  Procedure Laterality Date   CESAREAN SECTION  1988   SHOULDER ARTHROSCOPY WITH SUBACROMIAL DECOMPRESSION AND OPEN ROTATOR C Right 12/09/2013   Procedure: RIGHT SHOULDER ARTHROSCOPY WITH SUBACROMIAL DECOMPRESSION AND MINI OPEN ROTATOR CUFF REPAIR, AND BICEPS TENDODESIS;  Surgeon: Elspeth JONELLE Her, MD;  Location: [facility] OR;  Service: Orthopedics;  Laterality: Right;   TONSILLECTOMY     Patient Active Problem List   Diagnosis Date Noted   Hyperlipidemia 03/25/2022   Herpes labialis 02/16/2021   Gastroesophageal reflux disease without esophagitis 02/15/2018   Lactose intolerance 02/15/2018   History of vitamin D  deficiency 02/15/2018   Age-related osteoporosis without current pathological fracture 02/15/2018   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   Spondylosis 05/26/2007   Disorder of gallbladder 03/11/2007    PCP: Norleen JAYSON Jobs, MD  REFERRING PROVIDER: Elspeth Parker, MD  REFERRING DIAG: M51.16 (ICD-10-CM) - Intervertebral disc disorders with  radiculopathy, lumbar region  Rationale for Evaluation and Treatment: Rehabilitation  THERAPY DIAG:  Radiculopathy, lumbar region  Pain in left hip  Abnormal posture  Difficulty in walking, not elsewhere classified  Muscle weakness (generalized)  ONSET DATE: Bad pain started in early March (6th) 2025 with standing and walking  SUBJECTIVE:  SUBJECTIVE STATEMENT: I did one set of everything this morning and I'm doing okay. Pain rated at 1/10 this morning.    PMH Rt RTC repair 2015  PAIN:  Are you having pain? Yes: NPRS scale: 1-4/10 low back; left gluteals 2-6/10; left lateral calf 0-3/10 infrequent (was 3-7/10)  Pain location: Low back and left lower extremity as distal as the lateral calf Pain description: Ache, sore, can be sharp Aggravating factors: Prolonged standing and walking Relieving factors: Change of position  PRECAUTIONS: Back  RED FLAGS: None   WEIGHT BEARING RESTRICTIONS: No  FALLS:  Has patient fallen in last 6 months? No  LIVING ENVIRONMENT: Lives with: lives with their family and lives with their spouse Lives in: House/apartment Stairs: No problem Has following equipment at home: None  OCCUPATION: Works 1 day a week at [employer]  PLOF: Independent  PATIENT GOALS: Get back to where she was before the flare-up in March  NEXT MD VISIT: Dr Eldonna 10/26/2023  OBJECTIVE:  Note: Objective measures were completed at Evaluation unless otherwise noted.  DIAGNOSTIC FINDINGS:  IMPRESSION: 1. Asymmetric marrow edema within the left L5 pedicle and transverse process, corresponding with a probable nondisplaced fracture on the recent abdominopelvic CT. No displaced fracture or other acute osseous findings demonstrated. 2. Chronic spondylosis at L4-5 with asymmetric  disc bulging and endplate osteophytes asymmetric to the left. There is mild narrowing of the left lateral recess and mild to moderate left foraminal narrowing without definite nerve root impingement. 3. Mild left foraminal narrowing at L5-S1 without definite nerve root impingement. 4. No spinal stenosis or definite nerve root impingement. 5. Convex left thoracolumbar scoliosis.  PATIENT SURVEYS:  PSFS: THE PATIENT SPECIFIC FUNCTIONAL SCALE  Place score of 0-10 (0 = unable to perform activity and 10 = able to perform activity at the same level as before injury or problem)  Activity Date: 09/23/2023    Standing 4/10    2.  Showering 4/10    3.  Walking 4/10    4.      Total Score 4      Total Score = Sum of activity scores/number of activities  Minimally Detectable Change: 3 points (for single activity); 2 points (for average score)  [facility] (nd). The Patient Specific Functional Scale . Retrieved from [email or web address]   COGNITION: Overall cognitive status: Within functional limits for tasks assessed     SENSATION: Daily left lower extremity pain and paresthesias as distal as the lateral calf, mostly lateral hip  MUSCLE LENGTH: 10/09/2023: Hamstrings: Right 60 deg; Left 60 deg  Eval: Hamstrings: Right 55 deg; Left 55 deg   POSTURE: rounded shoulders, forward head, and decreased lumbar lordosis  LUMBAR ROM:   AROM 09/23/2023 8/?/2025  Flexion    Extension 15   Right lateral flexion    Left lateral flexion 40   Right rotation 30   Left rotation     (Blank rows = not tested)  LOWER EXTREMITY ROM:     Passive  Left/Right 09/23/2023 Left/Right 10/09/2023  Hip flexion 110/115 115/120  Hip extension    Hip abduction    Hip adduction    Hip internal rotation 22/14 24/14  Hip external rotation 45/46 43/45  Knee flexion    Knee extension    Ankle dorsiflexion 10/7   Ankle plantarflexion    Ankle  inversion    Ankle eversion     (Blank rows = not tested)  STRENGTH     09/23/2023   Hip  flexion    Hip extension    Hip abduction    Hip adduction    Hip internal rotation    Hip external rotation    Knee flexion    Knee extension    Ankle dorsiflexion    Ankle plantarflexion    Ankle inversion    Ankle eversion    Lumbar extension (superwoman test) 15 seconds with pain (Goal is 30 to 60 seconds without pain)    (Blank rows = not tested)  GAIT: Distance walked: 50 feet Assistive device utilized: None Level of assistance: Complete Independence Comments: [name] is currently walking 4 days a week for 20 to 30 minutes for exercise.  TREATMENT DATE:  10/14/2023 TherEx:  Lumbar extension AROM through small movements, 1x5 with 5s hold; hands on glutes  Prone cobra on elbows 3x30s  Quadruped hydrants, 1x10 each side (highly fatiguing on right>left), 1x6 (less difficulty, though right more difficult than left) Standing rows with red TB 3x6 with 2-3s hold  Standing straight arm pulldown with red TB 2x6 with 2-3s hold Paloff press with red TB 2x6 each side  Seated figure 4 stretch 2x30s each side  Seated abdominal press with red theraball 1x15 with 5s hold Seated marching for core strength 2x10 each leg  Supine hamstring stretch with strap 2x30s each leg   10/13/2023 Limited range lumbar extension AROM, hands-on gluteals and gentle hip extension 2 sets of 5 x 3 seconds Scapular retraction/shoulder blade pinches 5 x 5 seconds Quadruped leg extension 10 x 3 seconds      Yoga Bridge 10 x 5 seconds       Hip hike 10 x 3 seconds at counter top    Hip hike in door frame (to get rid of lateral lean) 2 sets of 5 for 3 seconds Right side-lie clams with Yellow Thera-Band  3 sets of 10 x 3 seconds  02464: Practical logroll review; practical review of anatomy with spine model and again reviewed imaging   10/09/2023 Limited range lumbar extension AROM, hands-on gluteals and gentle hip  extension 10 x 3 seconds Scapular retraction/shoulder blade pinches 10 x 5 seconds Quadruped leg extension 10 x 3 seconds Prone alternating hip extensions 10 x 3 seconds      Yoga Bridge 10 x 5 seconds           [id number]: Practical logroll; practical golfers and diagonal squat lifts; objective reassessment and reviewed changes to HEP   10/07/2023 TherEx:  Lumbar rollouts with green theraball 3x45s  Seated figure 4 stretch 3x30s each leg  Seated hamstring stretch 2x30s each leg  Supine lumbar rotations 1x10 each direction with 5s hold  Paloff press with green TB 2x10 each direction   Neuro Re-Ed:  Seated sciatic nerve glide with Lt LE 1x10  Supine sciatic nerve glide with Lt LE 1x10  PT educated patient on nerve sliders vs tensioners and how they help with radiating pain  Self-Care:  PT discussed pain vs soreness with HEP, alterations to HEP, connections between spine and hip musculature, and change of focus to core/hip strengthening as well as mobility    PATIENT EDUCATION:  Education details: See above Person educated: Patient Education method: Explanation, Demonstration, Tactile cues, Verbal cues, and Handouts Education comprehension: verbalized understanding, returned demonstration, verbal cues required, tactile cues required, and needs further education  HOME EXERCISE PROGRAM: Access Code: [email or web address] URL: [email or web address] Date: 10/09/2023 Prepared by: Lamar Ivory  Exercises - Standing Scapular Retraction  - 5 x daily - 7 x weekly -  1 sets - 5 reps - 5 seconds hold - Standing Lumbar Extension at Wall - Forearms  - 5 x daily - 7 x weekly - 1 sets - 5 reps - 3 seconds hold - Yoga Bridge  - 2 x daily - 7 x weekly - 1 sets - 10 reps - 5 seconds hold - Quadruped Alternating Leg Extensions  - 2 x daily - 7 x weekly - 1 sets - 10 reps - 5 seconds hold  ASSESSMENT:  CLINICAL IMPRESSION: Patient arrived to session noting improved symptoms this morning  secondary to a morning walk and one set of each HEP activity at lower reps. Patient tolerated all activities this date, though showing decreased strength in right abductors compared to left. Patient endorsed increased back pain with prolonged standing for exercises so she will most likely benefit from bouncing back and forth between standing and seated/supine exercises. Patient endorses next appointment with MD will be August 18th for an epidural injection as well as having recently been referred to [facility]. Patient will continue to benefit from skilled PT.     OBJECTIVE IMPAIRMENTS: Abnormal gait, decreased activity tolerance, decreased endurance, decreased knowledge of condition, difficulty walking, decreased ROM, decreased strength, decreased safety awareness, impaired perceived functional ability, improper body mechanics, postural dysfunction, and pain.   ACTIVITY LIMITATIONS: carrying, lifting, bending, sitting, standing, sleeping, bed mobility, and locomotion level  PARTICIPATION LIMITATIONS: community activity  PERSONAL FACTORS: Rt RTC repair 2015 may also affect this patient's functional outcome.   REHAB POTENTIAL: Good  CLINICAL DECISION MAKING: Stable/uncomplicated  EVALUATION COMPLEXITY: Moderate   GOALS: Goals reviewed with patient? Yes  SHORT TERM GOALS: Target date: 10/21/2023  Madelin will be independent with her day 1 home exercise program Baseline: Started 09/23/2023 Goal status: Met 10/13/2023  2.  [name] will have improved postural awareness and ability to implement this into all static and dynamic activities Baseline: Core body mechanics with flexion in a seated position when dropping things and picking up her purse at the end of her treatment today Goal status: Ongoing 10/13/2023  3.  Improve spine strength as assessed by 30 seconds of pain-free prone superwoman test position Baseline: 15 seconds with pain Goal status: INITIAL   LONG TERM  GOALS: Target date: 11/18/2023  Improve patient specific functional score to at least 7 Baseline: 4 Goal status: INITIAL  2.  [name] will report low back pain consistently 3/10 or less on the numeric pain rating scale with no reports of hip or lower extremity pain Baseline: 3-7/10 with left hip and lower extremity pain as distal as the left lateral calf Goal status: On Going 10/13/2023  3.  Improve spine strength as assessed by 60 seconds of pain-free superwoman testing Baseline: 15 seconds with pain Goal status: INITIAL  4.  [name] will be independent with her daily maintenance home exercise program at discharge Baseline: Started 09/23/2023 Goal status: INITIAL   PLAN:  PT FREQUENCY: 2x/week  PT DURATION: 8 weeks  PLANNED INTERVENTIONS: 97750- Physical Performance Testing, 97110-Therapeutic exercises, 97530- Therapeutic activity, 97112- Neuromuscular re-education, 97535- Self Care, [id number]- Manual therapy, [id number]- Electrical stimulation (unattended), 02987- Traction (mechanical), 20560 (1-2 muscles), 20561 (3+ muscles)- Dry Needling, Patient/Family education, Spinal mobilization, and Cryotherapy.  PLAN FOR NEXT SESSION: Conservative, appropriate postural/low back strength progressions, isometric back extension exercises (?), biomechanics for functional movements (squats, carrying, lifting, etc.), core strengthening, back endurance (?)  Consider traction if peripheral symptoms persist after 3 to 4 weeks of appropriate physical therapy interventions.  Susannah Daring, PT, DPT 10/14/23 12:41 PM

## 2023-10-21 ENCOUNTER — Encounter: Payer: Self-pay | Admitting: Rehabilitative and Restorative Service Providers"

## 2023-10-21 ENCOUNTER — Ambulatory Visit: Admitting: Rehabilitative and Restorative Service Providers"

## 2023-10-21 DIAGNOSIS — R262 Difficulty in walking, not elsewhere classified: Secondary | ICD-10-CM

## 2023-10-21 DIAGNOSIS — M25552 Pain in left hip: Secondary | ICD-10-CM

## 2023-10-21 DIAGNOSIS — M6281 Muscle weakness (generalized): Secondary | ICD-10-CM

## 2023-10-21 DIAGNOSIS — R293 Abnormal posture: Secondary | ICD-10-CM | POA: Diagnosis not present

## 2023-10-21 DIAGNOSIS — M5416 Radiculopathy, lumbar region: Secondary | ICD-10-CM | POA: Diagnosis not present

## 2023-10-21 NOTE — Therapy (Signed)
 OUTPATIENT PHYSICAL THERAPY THORACOLUMBAR TREATMENT   Patient Name: [name] MRN: [id number] DOB:10-Apr-1955, 68 y.o., female Today's Date: 10/21/2023  END OF SESSION:  PT End of Session - 10/21/23 1428     Visit Number 8    Number of Visits 16    Date for PT Re-Evaluation 11/18/23    Authorization - Number of Visits 16    Progress Note Due on Visit 10    PT Start Time 1428    PT Stop Time 1515    PT Time Calculation (min) 47 min    Activity Tolerance Patient tolerated treatment well;No increased pain    Behavior During Therapy WFL for tasks assessed/performed                 Past Medical History:  Diagnosis Date   Anxiety    very high   GERD (gastroesophageal reflux disease)    Glaucoma    Heart murmur    Pt saw Cardiologist when she was in her 75s, not since.   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   Osteoporosis    Scoliosis    Past Surgical History:  Procedure Laterality Date   CESAREAN SECTION  1988   SHOULDER ARTHROSCOPY WITH SUBACROMIAL DECOMPRESSION AND OPEN ROTATOR C Right 12/09/2013   Procedure: RIGHT SHOULDER ARTHROSCOPY WITH SUBACROMIAL DECOMPRESSION AND MINI OPEN ROTATOR CUFF REPAIR, AND BICEPS TENDODESIS;  Surgeon: Elspeth JONELLE Her, MD;  Location: [facility] OR;  Service: Orthopedics;  Laterality: Right;   TONSILLECTOMY     Patient Active Problem List   Diagnosis Date Noted   Hyperlipidemia 03/25/2022   Herpes labialis 02/16/2021   Gastroesophageal reflux disease without esophagitis 02/15/2018   Lactose intolerance 02/15/2018   History of vitamin D  deficiency 02/15/2018   Age-related osteoporosis without current pathological fracture 02/15/2018   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   Spondylosis 05/26/2007   Disorder of gallbladder 03/11/2007    PCP: Norleen JAYSON Jobs, MD  REFERRING PROVIDER: Elspeth Parker, MD  REFERRING DIAG: M51.16 (ICD-10-CM) - Intervertebral disc  disorders with radiculopathy, lumbar region  Rationale for Evaluation and Treatment: Rehabilitation  THERAPY DIAG:  Radiculopathy, lumbar region  Pain in left hip  Abnormal posture  Difficulty in walking, not elsewhere classified  Muscle weakness (generalized)  ONSET DATE: Bad pain started in early March (6th) 2025 with standing and walking  SUBJECTIVE:  SUBJECTIVE STATEMENT: [name] notes better strength since starting PT.  She notes peripheral symptoms increase with prolonged sitting and walking, although this has improved from evaluation.   PMH Rt RTC repair 2015  PAIN:  Are you having pain? Yes: NPRS scale: 0-4/10 low back; left gluteals 0-5/10; left lateral calf 0-3/10 infrequent (was 3-7/10)  Pain location: Low back and left lower extremity as distal as the lateral calf Pain description: Ache, sore, can be sharp Aggravating factors: Prolonged standing and walking Relieving factors: Change of position  PRECAUTIONS: Back  RED FLAGS: None   WEIGHT BEARING RESTRICTIONS: No  FALLS:  Has patient fallen in last 6 months? No  LIVING ENVIRONMENT: Lives with: lives with their family and lives with their spouse Lives in: House/apartment Stairs: No problem Has following equipment at home: None  OCCUPATION: Works 1 day a week at [employer]  PLOF: Independent  PATIENT GOALS: Get back to where she was before the flare-up in March  NEXT MD VISIT: Dr Eldonna 10/26/2023  OBJECTIVE:  Note: Objective measures were completed at Evaluation unless otherwise noted.  DIAGNOSTIC FINDINGS:  IMPRESSION: 1. Asymmetric marrow edema within the left L5 pedicle and transverse process, corresponding with a probable nondisplaced fracture on the recent abdominopelvic CT. No displaced fracture or other  acute osseous findings demonstrated. 2. Chronic spondylosis at L4-5 with asymmetric disc bulging and endplate osteophytes asymmetric to the left. There is mild narrowing of the left lateral recess and mild to moderate left foraminal narrowing without definite nerve root impingement. 3. Mild left foraminal narrowing at L5-S1 without definite nerve root impingement. 4. No spinal stenosis or definite nerve root impingement. 5. Convex left thoracolumbar scoliosis.  PATIENT SURVEYS:  PSFS: THE PATIENT SPECIFIC FUNCTIONAL SCALE  Place score of 0-10 (0 = unable to perform activity and 10 = able to perform activity at the same level as before injury or problem)  Activity Date: 09/23/2023    Standing 4/10    2.  Showering 4/10    3.  Walking 4/10    4.      Total Score 4      Total Score = Sum of activity scores/number of activities  Minimally Detectable Change: 3 points (for single activity); 2 points (for average score)  [facility] (nd). The Patient Specific Functional Scale . Retrieved from [email or web address]   COGNITION: Overall cognitive status: Within functional limits for tasks assessed     SENSATION: Daily left lower extremity pain and paresthesias as distal as the lateral calf, mostly lateral hip  MUSCLE LENGTH: 10/21/2023: Hamstrings: Right 65 deg; Left 65 deg  10/09/2023: Hamstrings: Right 60 deg; Left 60 deg  Eval: Hamstrings: Right 55 deg; Left 55 deg   POSTURE: rounded shoulders, forward head, and decreased lumbar lordosis  LUMBAR ROM:   AROM 09/23/2023 10/21/2023  Flexion    Extension 15 20  Right lateral flexion    Left lateral flexion 40 40  Right rotation 30 40  Left rotation     (Blank rows = not tested)  LOWER EXTREMITY ROM:     Passive  Left/Right 09/23/2023 Left/Right 10/09/2023 Left/Right 10/21/2023  Hip flexion 110/115 115/120 120/120  Hip extension     Hip abduction     Hip  adduction     Hip internal rotation 22/14 24/14 28/17   Hip external rotation 45/46 43/45 45/45   Knee flexion     Knee extension     Ankle dorsiflexion 10/7    Ankle plantarflexion  Ankle inversion     Ankle eversion      (Blank rows = not tested)  STRENGTH     09/23/2023   Hip flexion    Hip extension    Hip abduction    Hip adduction    Hip internal rotation    Hip external rotation    Knee flexion    Knee extension    Ankle dorsiflexion    Ankle plantarflexion    Ankle inversion    Ankle eversion    Lumbar extension (superwoman test) 15 seconds with pain (Goal is 30 to 60 seconds without pain)    (Blank rows = not tested)  GAIT: Distance walked: 50 feet Assistive device utilized: None Level of assistance: Complete Independence Comments: [name] is currently walking 4 days a week for 20 to 30 minutes for exercise.  TREATMENT DATE:  10/21/2023 Limited range lumbar extension AROM, hands-on gluteals and gentle hip extension 2 sets of 5 x 3 seconds Scapular retraction/shoulder blade pinches 5 x 5 seconds Quadruped leg extension 10 x 5 seconds      Quadruped opposite arm and leg extensions 10 x 3 seconds Yoga Bridge 10 x 5 seconds       Hip hike in door frame (to get rid of lateral lean) 2 sets of 5 for 3 seconds Right side-lie clams with Red Thera-Band  3 sets of 10 x 3 seconds Demonstrated hip abduction with pelvic stabilization so that we may introduce at next visit  [id number]: Practical sleep posture review; practical review of activities to do on the airplane on her trip to [place] to decrease discomfort   10/14/2023 TherEx:  Lumbar extension AROM through small movements, 1x5 with 5s hold; hands on glutes  Prone cobra on elbows 3x30s  Quadruped hydrants, 1x10 each side (highly fatiguing on right>left), 1x6 (less difficulty, though right more difficult than left) Standing rows with red TB 3x6 with 2-3s hold  Standing straight arm pulldown with red TB 2x6 with 2-3s  hold Paloff press with red TB 2x6 each side  Seated figure 4 stretch 2x30s each side  Seated abdominal press with red theraball 1x15 with 5s hold Seated marching for core strength 2x10 each leg  Supine hamstring stretch with strap 2x30s each leg    10/13/2023 Limited range lumbar extension AROM, hands-on gluteals and gentle hip extension 2 sets of 5 x 3 seconds Scapular retraction/shoulder blade pinches 5 x 5 seconds Quadruped leg extension 10 x 3 seconds      Yoga Bridge 10 x 5 seconds       Hip hike 10 x 3 seconds at counter top    Hip hike in door frame (to get rid of lateral lean) 2 sets of 5 for 3 seconds Right side-lie clams with Yellow Thera-Band  3 sets of 10 x 3 seconds  02464: Practical logroll review; practical review of anatomy with spine model and again reviewed imaging   PATIENT EDUCATION:  Education details: See above Person educated: Patient Education method: Explanation, Demonstration, Tactile cues, Verbal cues, and Handouts Education comprehension: verbalized understanding, returned demonstration, verbal cues required, tactile cues required, and needs further education  HOME EXERCISE PROGRAM: Access Code: [email or web address] URL: [email or web address] Date: 10/09/2023 Prepared by: Lamar Ivory  Exercises - Standing Scapular Retraction  - 5 x daily - 7 x weekly - 1 sets - 5 reps - 5 seconds hold - Standing Lumbar Extension at Wall - Forearms  - 5 x daily - 7 x weekly - 1 sets -  5 reps - 3 seconds hold - Yoga Bridge  - 2 x daily - 7 x weekly - 1 sets - 10 reps - 5 seconds hold - Quadruped Alternating Leg Extensions  - 2 x daily - 7 x weekly - 1 sets - 10 reps - 5 seconds hold  ASSESSMENT:  CLINICAL IMPRESSION: [name] reports improved strength and endurance to weight-bearing since starting physical therapy.  Her endurance with prolonged postures and weight-bearing will benefit from continued strength work as her flexibility and active range of motion are  excellent.  Body mechanics are also very good and we were able to make some strength progressions today for lumbar paraspinals.  Additional hip abductors and quadratus lumborum strengthening will also be beneficial and help [name] meet long-term goals and prepare for her upcoming trip to [place] which will involve a long plane ride and lots of walking.    OBJECTIVE IMPAIRMENTS: Abnormal gait, decreased activity tolerance, decreased endurance, decreased knowledge of condition, difficulty walking, decreased ROM, decreased strength, decreased safety awareness, impaired perceived functional ability, improper body mechanics, postural dysfunction, and pain.   ACTIVITY LIMITATIONS: carrying, lifting, bending, sitting, standing, sleeping, bed mobility, and locomotion level  PARTICIPATION LIMITATIONS: community activity  PERSONAL FACTORS: Rt RTC repair 2015 may also affect this patient's functional outcome.   REHAB POTENTIAL: Good  CLINICAL DECISION MAKING: Stable/uncomplicated  EVALUATION COMPLEXITY: Moderate   GOALS: Goals reviewed with patient? Yes  SHORT TERM GOALS: Target date: 10/21/2023  Madelin will be independent with her day 1 home exercise program Baseline: Started 09/23/2023 Goal status: Met 10/13/2023  2.  [name] will have improved postural awareness and ability to implement this into all static and dynamic activities Baseline: Core body mechanics with flexion in a seated position when dropping things and picking up her purse at the end of her treatment today Goal status: Met 10/21/2023  3.  Improve spine strength as assessed by 30 seconds of pain-free prone superwoman test position Baseline: 15 seconds with pain Goal status: INITIAL   LONG TERM GOALS: Target date: 11/18/2023  Improve patient specific functional score to at least 7 Baseline: 4 Goal status: INITIAL  2.  [name] will report low back pain consistently 3/10 or less on the numeric pain rating scale with no reports of hip  or lower extremity pain Baseline: 3-7/10 with left hip and lower extremity pain as distal as the left lateral calf Goal status: On Going 10/21/2023  3.  Improve spine strength as assessed by 60 seconds of pain-free superwoman testing Baseline: 15 seconds with pain Goal status: INITIAL  4.  [name] will be independent with her daily maintenance home exercise program at discharge Baseline: Started 09/23/2023 Goal status: INITIAL   PLAN:  PT FREQUENCY: 2x/week  PT DURATION: 8 weeks  PLANNED INTERVENTIONS: 97750- Physical Performance Testing, 97110-Therapeutic exercises, 97530- Therapeutic activity, 97112- Neuromuscular re-education, 97535- Self Care, 02859- Manual therapy, [id number]- Electrical stimulation (unattended), 02987- Traction (mechanical), 20560 (1-2 muscles), 20561 (3+ muscles)- Dry Needling, Patient/Family education, Spinal mobilization, and Cryotherapy.  PLAN FOR NEXT SESSION: Conservative, appropriate postural/low back strength progressions, isometric back extension exercises (?), biomechanics for functional movements (squats, carrying, lifting, etc.), core strengthening, back endurance (?)  Consider traction if peripheral symptoms persist after 3 to 4 weeks of appropriate physical therapy interventions.  Assess spine strength and patient specific functional scale on her next scheduled visit.   Myer LELON Ivory PT, MPT 10/21/23 4:29 PM

## 2023-10-22 ENCOUNTER — Telehealth: Payer: Self-pay | Admitting: Physical Medicine and Rehabilitation

## 2023-10-22 ENCOUNTER — Other Ambulatory Visit: Payer: Self-pay | Admitting: Physical Medicine and Rehabilitation

## 2023-10-22 ENCOUNTER — Ambulatory Visit: Admitting: Cardiology

## 2023-10-22 ENCOUNTER — Telehealth: Payer: Self-pay

## 2023-10-22 MED ORDER — DIAZEPAM 5 MG PO TABS: ORAL_TABLET | ORAL | 0 refills | Status: DC

## 2023-10-22 NOTE — Telephone Encounter (Signed)
 Patient called and said she has a lot of questions before her appointment. CB#[phone]

## 2023-10-22 NOTE — Telephone Encounter (Signed)
 Patient requesting Pre medication prior to procedure, call it to [facility] [address].

## 2023-10-23 ENCOUNTER — Ambulatory Visit: Admitting: Rehabilitative and Restorative Service Providers"

## 2023-10-23 ENCOUNTER — Encounter: Payer: Self-pay | Admitting: Rehabilitative and Restorative Service Providers"

## 2023-10-23 DIAGNOSIS — R262 Difficulty in walking, not elsewhere classified: Secondary | ICD-10-CM

## 2023-10-23 DIAGNOSIS — R293 Abnormal posture: Secondary | ICD-10-CM

## 2023-10-23 DIAGNOSIS — M25552 Pain in left hip: Secondary | ICD-10-CM

## 2023-10-23 DIAGNOSIS — M6281 Muscle weakness (generalized): Secondary | ICD-10-CM

## 2023-10-23 DIAGNOSIS — M5416 Radiculopathy, lumbar region: Secondary | ICD-10-CM

## 2023-10-23 NOTE — Therapy (Addendum)
 OUTPATIENT PHYSICAL THERAPY THORACOLUMBAR TREATMENT  PHYSICAL THERAPY DISCHARGE SUMMARY  Visits from Start of Care: 9  Current functional level related to goals / functional outcomes: See note   Remaining deficits: See note   Education / Equipment: HEP   Patient agrees to discharge. Patient goals were partially met. Patient is being discharged due to not returning since the last visit.   Patient Name: [name] MRN: [id number] DOB:02/28/1956, 68 y.o., female Today's Date: 02/03/2024  END OF SESSION:    Past Medical History:  Diagnosis Date   Anxiety    very high   GERD (gastroesophageal reflux disease)    Glaucoma    Heart murmur    Pt saw Cardiologist when she was in [name] 60s, not since.   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   Osteoporosis    Scoliosis    Past Surgical History:  Procedure Laterality Date   CESAREAN SECTION  1988   SHOULDER ARTHROSCOPY WITH SUBACROMIAL DECOMPRESSION AND OPEN ROTATOR C Right 12/09/2013   Procedure: RIGHT SHOULDER ARTHROSCOPY WITH SUBACROMIAL DECOMPRESSION AND MINI OPEN ROTATOR CUFF REPAIR, AND BICEPS TENDODESIS;  Surgeon: [name] JONELLE Her, MD;  Location: [facility] OR;  Service: Orthopedics;  Laterality: Right;   TONSILLECTOMY     Patient Active Problem List   Diagnosis Date Noted   Hyperlipidemia 03/25/2022   Herpes labialis 02/16/2021   Gastroesophageal reflux disease without esophagitis 02/15/2018   Lactose intolerance 02/15/2018   History of vitamin D  deficiency 02/15/2018   Age-related osteoporosis without current pathological fracture 02/15/2018   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   Spondylosis 05/26/2007   Disorder of gallbladder 03/11/2007    PCP: [name] JAYSON Jobs, MD  REFERRING PROVIDER: Elspeth Parker, MD  REFERRING DIAG: M51.16 (ICD-10-CM) - Intervertebral disc disorders with radiculopathy, lumbar region  Rationale for Evaluation and Treatment:  Rehabilitation  THERAPY DIAG:  Radiculopathy, lumbar region  Pain in left hip  Abnormal posture  Difficulty in walking, not elsewhere classified  Muscle weakness (generalized)  ONSET DATE: Bad pain started in early March (6th) 2025 with standing and walking  SUBJECTIVE:                                                                                                                                                                                           SUBJECTIVE STATEMENT: [name] notes some left hip/groin pain that may be related to overuse with walking.  Peripheral symptoms increase with prolonged sitting and walking, although this is better as compared to evaluation.   PMH Rt RTC repair  2015  PAIN:  Are you having pain? Yes: NPRS scale: 0-4/10 low back; left gluteals 0-5/10; left lateral calf 0-3/10 infrequent (was 3-7/10)  Pain location: Low back and left lower extremity as distal as the lateral calf Pain description: Ache, sore, can be sharp Aggravating factors: Prolonged standing and walking Relieving factors: Change of position  PRECAUTIONS: Back  RED FLAGS: None   WEIGHT BEARING RESTRICTIONS: No  FALLS:  Has patient fallen in last 6 months? No  LIVING ENVIRONMENT: Lives with: lives with their family and lives with their spouse Lives in: House/apartment Stairs: No problem Has following equipment at home: None  OCCUPATION: Works 1 day a week at [employer]  PLOF: Independent  PATIENT GOALS: Get back to where she was before the flare-up in March  NEXT MD VISIT: [name] 10/26/2023  OBJECTIVE:  Note: Objective measures were completed at Evaluation unless otherwise noted.  DIAGNOSTIC FINDINGS:  IMPRESSION: 1. Asymmetric marrow edema within the left L5 pedicle and transverse process, corresponding with a probable nondisplaced fracture on the recent abdominopelvic CT. No displaced fracture or other acute osseous findings demonstrated. 2. Chronic  spondylosis at L4-5 with asymmetric disc bulging and endplate osteophytes asymmetric to the left. There is mild narrowing of the left lateral recess and mild to moderate left foraminal narrowing without definite nerve root impingement. 3. Mild left foraminal narrowing at L5-S1 without definite nerve root impingement. 4. No spinal stenosis or definite nerve root impingement. 5. Convex left thoracolumbar scoliosis.  PATIENT SURVEYS:  PSFS: THE PATIENT SPECIFIC FUNCTIONAL SCALE  Place score of 0-10 (0 = unable to perform activity and 10 = able to perform activity at the same level as before injury or problem)  Activity Date: 09/23/2023    Standing 4/10    2.  Showering 4/10    3.  Walking 4/10    4.      Total Score 4      Total Score = Sum of activity scores/number of activities  Minimally Detectable Change: 3 points (for single activity); 2 points (for average score)  [name] (nd). The Patient Specific Functional Scale . Retrieved from [name]   COGNITION: Overall cognitive status: Within functional limits for tasks assessed     SENSATION: Daily left lower extremity pain and paresthesias as distal as the lateral calf, mostly lateral hip  MUSCLE LENGTH: 10/21/2023: Hamstrings: Right 65 deg; Left 65 deg  10/09/2023: Hamstrings: Right 60 deg; Left 60 deg  Eval: Hamstrings: Right 55 deg; Left 55 deg   POSTURE: rounded shoulders, forward head, and decreased lumbar lordosis  LUMBAR ROM:   AROM 09/23/2023 10/21/2023  Flexion    Extension 15 20  Right lateral flexion    Left lateral flexion 40 40  Right rotation 30 40  Left rotation     (Blank rows = not tested)  LOWER EXTREMITY ROM:     Passive  Left/Right 09/23/2023 Left/Right 10/09/2023 Left/Right 10/21/2023  Hip flexion 110/115 115/120 120/120  Hip extension     Hip abduction     Hip adduction     Hip internal rotation 22/14 24/14 28/17    Hip external rotation 45/46 43/45 45/45   Knee flexion     Knee extension     Ankle dorsiflexion 10/7    Ankle plantarflexion     Ankle inversion     Ankle eversion      (Blank rows = not tested)  STRENGTH     09/23/2023   Hip flexion  Hip extension    Hip abduction    Hip adduction    Hip internal rotation    Hip external rotation    Knee flexion    Knee extension    Ankle dorsiflexion    Ankle plantarflexion    Ankle inversion    Ankle eversion    Lumbar extension (superwoman test) 15 seconds with pain (Goal is 30 to 60 seconds without pain)    (Blank rows = not tested)  GAIT: Distance walked: 50 feet Assistive device utilized: None Level of assistance: Complete Independence Comments: [name] is currently walking 4 days a week for 20 to 30 minutes for exercise.  TREATMENT DATE:  10/23/2023 Hip flexors isometrics in standing for left hip 2 sets of 10 x 5 seconds with PT assist Quadruped leg extension 10 x 5 seconds      Quadruped opposite arm and leg extensions 10 x 5 seconds Yoga Bridge 10 x 5 seconds       Hip hike at counter top (to get rid of lateral lean) 2 sets of 10 for 3 seconds, 1 set facing and 1 set next to counter top Right side-lie clams with Red Thera-Band  2 sets of 10 x 3 seconds  02464: Practical review of activities to do on the airplane on [name] 8+ hour plane trip   10/21/2023 Limited range lumbar extension AROM, hands-on gluteals and gentle hip extension 2 sets of 5 x 3 seconds Scapular retraction/shoulder blade pinches 5 x 5 seconds Quadruped leg extension 10 x 5 seconds      Quadruped opposite arm and leg extensions 10 x 3 seconds Yoga Bridge 10 x 5 seconds       Hip hike in door frame (to get rid of lateral lean) 2 sets of 5 for 3 seconds Right side-lie clams with Red Thera-Band  3 sets of 10 x 3 seconds Demonstrated hip abduction with pelvic stabilization so that we may introduce at next visit  97535: Practical sleep posture review;  practical review of activities to do on the airplane on [name] trip to [place] to decrease discomfort   10/14/2023 TherEx:  Lumbar extension AROM through small movements, 1x5 with 5s hold; hands on glutes  Prone cobra on elbows 3x30s  Quadruped hydrants, 1x10 each side (highly fatiguing on right>left), 1x6 (less difficulty, though right more difficult than left) Standing rows with red TB 3x6 with 2-3s hold  Standing straight arm pulldown with red TB 2x6 with 2-3s hold Paloff press with red TB 2x6 each side  Seated figure 4 stretch 2x30s each side  Seated abdominal press with red theraball 1x15 with 5s hold Seated marching for core strength 2x10 each leg  Supine hamstring stretch with strap 2x30s each leg    PATIENT EDUCATION:  Education details: See above Person educated: Patient Education method: Explanation, Demonstration, Tactile cues, Verbal cues, and Handouts Education comprehension: verbalized understanding, returned demonstration, verbal cues required, tactile cues required, and needs further education  HOME EXERCISE PROGRAM: Access Code: [email or web address] URL: [email or web address] Date: 10/09/2023 Prepared by: Lamar Ivory  Exercises - Standing Scapular Retraction  - 5 x daily - 7 x weekly - 1 sets - 5 reps - 5 seconds hold - Standing Lumbar Extension at Wall - Forearms  - 5 x daily - 7 x weekly - 1 sets - 5 reps - 3 seconds hold - Yoga Bridge  - 2 x daily - 7 x weekly - 1 sets - 10 reps - 5 seconds hold - Quadruped Alternating  Leg Extensions  - 2 x daily - 7 x weekly - 1 sets - 10 reps - 5 seconds hold  ASSESSMENT:  CLINICAL IMPRESSION: [name] had some left hip flexors overuse signs and symptoms today.  I recommended she cut back a bit on [name] walking and gave [name] an isometric strength exercise to address this.  [name] is getting [name] epidural on Monday and will follow-up with me upon return from [name] vacation for reassessment and additional recommendations.   OBJECTIVE  IMPAIRMENTS: Abnormal gait, decreased activity tolerance, decreased endurance, decreased knowledge of condition, difficulty walking, decreased ROM, decreased strength, decreased safety awareness, impaired perceived functional ability, improper body mechanics, postural dysfunction, and pain.   ACTIVITY LIMITATIONS: carrying, lifting, bending, sitting, standing, sleeping, bed mobility, and locomotion level  PARTICIPATION LIMITATIONS: community activity  PERSONAL FACTORS: Rt RTC repair 2015 may also affect this patient's functional outcome.   REHAB POTENTIAL: Good  CLINICAL DECISION MAKING: Stable/uncomplicated  EVALUATION COMPLEXITY: Moderate   GOALS: Goals reviewed with patient? Yes  SHORT TERM GOALS: Target date: 10/21/2023  Madelin will be independent with [name] day 1 home exercise program Baseline: Started 09/23/2023 Goal status: Met 10/13/2023  2.  [name] will have improved postural awareness and ability to implement this into all static and dynamic activities Baseline: Core body mechanics with flexion in a seated position when dropping things and picking up [name] purse at the end of [name] treatment today Goal status: Met 10/21/2023  3.  Improve spine strength as assessed by 30 seconds of pain-free prone superwoman test position Baseline: 15 seconds with pain Goal status: INITIAL   LONG TERM GOALS: Target date: 11/18/2023  Improve patient specific functional score to at least 7 Baseline: 4 Goal status: INITIAL  2.  [name] will report low back pain consistently 3/10 or less on the numeric pain rating scale with no reports of hip or lower extremity pain Baseline: 3-7/10 with left hip and lower extremity pain as distal as the left lateral calf Goal status: On Going 10/23/2023  3.  Improve spine strength as assessed by 60 seconds of pain-free superwoman testing Baseline: 15 seconds with pain Goal status: INITIAL  4.  [name] will be independent with [name] daily maintenance home exercise  program at discharge Baseline: Started 09/23/2023 Goal status: INITIAL   PLAN:  PT FREQUENCY: 2x/week  PT DURATION: 8 weeks  PLANNED INTERVENTIONS: 97750- Physical Performance Testing, 97110-Therapeutic exercises, 97530- Therapeutic activity, 97112- Neuromuscular re-education, 97535- Self Care, 02859- Manual therapy, G0283- Electrical stimulation (unattended), 02987- Traction (mechanical), 20560 (1-2 muscles), 20561 (3+ muscles)- Dry Needling, Patient/Family education, Spinal mobilization, and Cryotherapy.  PLAN FOR NEXT SESSION: Conservative, appropriate postural/low back strength progressions, isometric back extension exercises (?), biomechanics for functional movements (squats, carrying, lifting, etc.), core strengthening, back endurance (?)  Consider traction if peripheral symptoms persist after 3 to 4 weeks of appropriate physical therapy interventions.  Spine strength and patient specific functional scale on [name] next scheduled visit?  Will need a recertification if she returns after [name] vacation.   Myer LELON Ivory PT, MPT 02/03/24 2:11 PM

## 2023-10-26 ENCOUNTER — Other Ambulatory Visit: Payer: Self-pay

## 2023-10-26 ENCOUNTER — Ambulatory Visit: Admitting: Physical Medicine and Rehabilitation

## 2023-10-26 VITALS — BP 112/75 | HR 91

## 2023-10-26 DIAGNOSIS — M5416 Radiculopathy, lumbar region: Secondary | ICD-10-CM

## 2023-10-26 MED ORDER — METHYLPREDNISOLONE ACETATE 40 MG/ML IJ SUSP
40.0000 mg | Freq: Once | INTRAMUSCULAR | Status: AC
  Administered 2023-10-26: 40 mg

## 2023-10-26 NOTE — Progress Notes (Signed)
 Pain Scale   Average Pain 1 Patient advising she has chronic lower back pain that radiates to her left hip area however pain has decreased a lot with PT.        +Driver, -BT, -Dye Allergies.

## 2023-10-26 NOTE — Patient Instructions (Signed)

## 2023-10-28 ENCOUNTER — Encounter: Payer: Self-pay | Admitting: Family Medicine

## 2023-10-29 ENCOUNTER — Ambulatory Visit

## 2023-11-01 NOTE — Progress Notes (Signed)
 [name] - 68 y.o. female MRN [id number]  Date of birth: 1955/06/13  Office Visit Note: Visit Date: 10/26/2023 PCP: Joyce Norleen BROCKS, MD Referred by: Onetha Kuba, MD  Subjective: Chief Complaint  Patient presents with   Lower Back - Pain   HPI:  [name] is a 68 y.o. female who comes in today at the request of Dr. Elspeth Parker for planned Left L5-S1 Lumbar Interlaminar epidural steroid injection with fluoroscopic guidance.  The patient has failed conservative care including home exercise, medications, time and activity modification.  This injection will be diagnostic and hopefully therapeutic.  Please see requesting physician notes for further details and justification.   ROS Otherwise per HPI.  Assessment & Plan: Visit Diagnoses:    ICD-10-CM   1. Lumbar radiculopathy  M54.16 XR C-ARM NO REPORT    Epidural Steroid injection    methylPREDNISolone  acetate (DEPO-MEDROL ) injection 40 mg      Plan: No additional findings.   Meds & Orders:  Meds ordered this encounter  Medications   methylPREDNISolone  acetate (DEPO-MEDROL ) injection 40 mg    Orders Placed This Encounter  Procedures   XR C-ARM NO REPORT   Epidural Steroid injection    Follow-up: Return for visit to requesting provider as needed.   Procedures: No procedures performed  Lumbar Epidural Steroid Injection - Interlaminar Approach with Fluoroscopic Guidance  Patient: [name]      Date of Birth: 03-15-1955 MRN: [id number] PCP: Joyce Norleen BROCKS, MD      Visit Date: 10/26/2023   Universal Protocol:     Consent Given By: the patient  Position: PRONE  Additional Comments: Vital signs were monitored before and after the procedure. Patient was prepped and draped in the usual sterile fashion. The correct patient, procedure, and site was verified.   Injection Procedure Details:   Procedure diagnoses: Lumbar radiculopathy [M54.16]   Meds Administered:  Meds ordered this encounter   Medications   methylPREDNISolone  acetate (DEPO-MEDROL ) injection 40 mg     Laterality: Left  Location/Site:  L5-S1  Needle: 3.5 in., 20 ga. Tuohy  Needle Placement: Paramedian epidural  Findings:   -Comments: Excellent flow of contrast into the epidural space.  Procedure Details: Using a paramedian approach from the side mentioned above, the region overlying the inferior lamina was localized under fluoroscopic visualization and the soft tissues overlying this structure were infiltrated with 4 ml. of 1% Lidocaine  without Epinephrine . The Tuohy needle was inserted into the epidural space using a paramedian approach.   The epidural space was localized using loss of resistance along with counter oblique bi-planar fluoroscopic views.  After negative aspirate for air, blood, and CSF, a 2 ml. volume of Isovue -250 was injected into the epidural space and the flow of contrast was observed. Radiographs were obtained for documentation purposes.    The injectate was administered into the level noted above.   Additional Comments:  The patient tolerated the procedure well Dressing: 2 x 2 sterile gauze and Band-Aid    Post-procedure details: Patient was observed during the procedure. Post-procedure instructions were reviewed.  Patient left the clinic in stable condition.   Clinical History: MRI LUMBAR SPINE WITHOUT CONTRAST   TECHNIQUE: Multiplanar, multisequence MR imaging of the lumbar spine was performed. No intravenous contrast was administered.   COMPARISON:  CT of the chest, abdomen and pelvis 08/02/2023.   FINDINGS: Segmentation: Conventional anatomy assumed, with the last open disc space designated L5-S1.Concordant with prior CT.   Alignment: Convex left thoracolumbar  scoliosis. No focal angulation or significant listhesis.   Vertebrae: No worrisome osseous lesion, acute fracture or pars defect. Stable chronic superior endplate compression deformity at T12. Chronic  endplate degenerative changes at L4-5, asymmetric to the left. Asymmetric marrow edema within the left L5 pedicle and transverse process, corresponding with a probable nondisplaced fracture on recent previous CT. The visualized sacroiliac joints appear unremarkable.   Conus medullaris: Extends to the L1-2 level. The conus and cauda equina appear normal.   Paraspinal and other soft tissues: No significant paraspinal findings.   Disc levels:   Sagittal images demonstrate no significant disc space findings within the visualized lower thoracic spine.   T12-L1: Mild disc bulging eccentric to the right. No spinal stenosis or significant foraminal narrowing.   L1-2: Mild disc bulging and endplate osteophytes asymmetric to the right. No spinal stenosis or significant foraminal narrowing.   L2-3: Mild disc bulging and endplate osteophytes asymmetric to the right. No spinal stenosis or significant foraminal narrowing.   L3-4: Preserved disc height and hydration. Mild bilateral facet hypertrophy. No spinal stenosis or significant foraminal narrowing.   L4-5: Chronic loss of disc height with annular disc bulging and endplate osteophytes asymmetric to the left. Mild bilateral facet hypertrophy, also greater on the left. As above, asymmetric marrow edema within the left L5 pedicle and transverse process corresponding with a probable nondisplaced fracture on the recent abdominopelvic CT. The spinal canal is patent. There is mild narrowing of the left lateral recess and mild to moderate left foraminal narrowing without definite nerve root impingement.   L5-S1: Preserved disc height with disc bulging and endplate osteophytes asymmetric to the left. Asymmetric left-sided facet hypertrophy. No spinal stenosis. Mild left foraminal narrowing without definite nerve root impingement. The spinal canal and right foramen are patent.   IMPRESSION: 1. Asymmetric marrow edema within the left L5  pedicle and transverse process, corresponding with a probable nondisplaced fracture on the recent abdominopelvic CT. No displaced fracture or other acute osseous findings demonstrated. 2. Chronic spondylosis at L4-5 with asymmetric disc bulging and endplate osteophytes asymmetric to the left. There is mild narrowing of the left lateral recess and mild to moderate left foraminal narrowing without definite nerve root impingement. 3. Mild left foraminal narrowing at L5-S1 without definite nerve root impingement. 4. No spinal stenosis or definite nerve root impingement. 5. Convex left thoracolumbar scoliosis.     Electronically Signed   By: Elsie Perone M.D.   On: 09/06/2023 12:31     Objective:  VS:  HT:    WT:   BMI:     BP:112/75  HR:91bpm  TEMP: ( )  RESP:  Physical Exam Vitals and nursing note reviewed.  Constitutional:      General: She is not in acute distress.    Appearance: Normal appearance. She is not ill-appearing.  HENT:     Head: Normocephalic and atraumatic.     Right Ear: External ear normal.     Left Ear: External ear normal.  Eyes:     Extraocular Movements: Extraocular movements intact.  Cardiovascular:     Rate and Rhythm: Normal rate.     Pulses: Normal pulses.  Pulmonary:     Effort: Pulmonary effort is normal. No respiratory distress.  Abdominal:     General: There is no distension.     Palpations: Abdomen is soft.  Musculoskeletal:        General: Tenderness present.     Cervical back: Neck supple.     Right lower  leg: No edema.     Left lower leg: No edema.     Comments: Patient has good distal strength with no pain over the greater trochanters.  No clonus or focal weakness.  Skin:    Findings: No erythema, lesion or rash.  Neurological:     General: No focal deficit present.     Mental Status: She is alert and oriented to person, place, and time.     Sensory: No sensory deficit.     Motor: No weakness or abnormal muscle tone.      Coordination: Coordination normal.  Psychiatric:        Mood and Affect: Mood normal.        Behavior: Behavior normal.      Imaging: No results found.

## 2023-11-01 NOTE — Procedures (Signed)
 Lumbar Epidural Steroid Injection - Interlaminar Approach with Fluoroscopic Guidance  Patient: [name]      Date of Birth: 1955-10-03 MRN: [id number] PCP: Joyce Norleen BROCKS, MD      Visit Date: 10/26/2023   Universal Protocol:     Consent Given By: the patient  Position: PRONE  Additional Comments: Vital signs were monitored before and after the procedure. Patient was prepped and draped in the usual sterile fashion. The correct patient, procedure, and site was verified.   Injection Procedure Details:   Procedure diagnoses: Lumbar radiculopathy [M54.16]   Meds Administered:  Meds ordered this encounter  Medications   methylPREDNISolone  acetate (DEPO-MEDROL ) injection 40 mg     Laterality: Left  Location/Site:  L5-S1  Needle: 3.5 in., 20 ga. Tuohy  Needle Placement: Paramedian epidural  Findings:   -Comments: Excellent flow of contrast into the epidural space.  Procedure Details: Using a paramedian approach from the side mentioned above, the region overlying the inferior lamina was localized under fluoroscopic visualization and the soft tissues overlying this structure were infiltrated with 4 ml. of 1% Lidocaine  without Epinephrine . The Tuohy needle was inserted into the epidural space using a paramedian approach.   The epidural space was localized using loss of resistance along with counter oblique bi-planar fluoroscopic views.  After negative aspirate for air, blood, and CSF, a 2 ml. volume of Isovue -250 was injected into the epidural space and the flow of contrast was observed. Radiographs were obtained for documentation purposes.    The injectate was administered into the level noted above.   Additional Comments:  The patient tolerated the procedure well Dressing: 2 x 2 sterile gauze and Band-Aid    Post-procedure details: Patient was observed during the procedure. Post-procedure instructions were reviewed.  Patient left the clinic in stable condition.

## 2023-11-03 ENCOUNTER — Ambulatory Visit (INDEPENDENT_AMBULATORY_CARE_PROVIDER_SITE_OTHER): Admitting: Physician Assistant

## 2023-11-03 DIAGNOSIS — M810 Age-related osteoporosis without current pathological fracture: Secondary | ICD-10-CM

## 2023-11-03 MED ORDER — ROMOSOZUMAB-AQQG 105 MG/1.17ML ~~LOC~~ SOSY
210.0000 mg | PREFILLED_SYRINGE | SUBCUTANEOUS | Status: AC
  Administered 2024-01-01: 210 mg via SUBCUTANEOUS

## 2023-11-04 ENCOUNTER — Ambulatory Visit (HOSPITAL_BASED_OUTPATIENT_CLINIC_OR_DEPARTMENT_OTHER)
Admission: RE | Admit: 2023-11-04 | Discharge: 2023-11-04 | Disposition: A | Discharge status: Home / Self Care | Payer: Self-pay | Source: Ambulatory Visit | Attending: Cardiology | Admitting: Cardiology

## 2023-11-04 DIAGNOSIS — E7800 Pure hypercholesterolemia, unspecified: Secondary | ICD-10-CM | POA: Insufficient documentation

## 2023-11-04 DIAGNOSIS — I771 Stricture of artery: Secondary | ICD-10-CM | POA: Insufficient documentation

## 2023-11-04 NOTE — Progress Notes (Signed)
 Office Visit Note   Patient: [name]           Date of Birth: [birth year]           MRN: [id number] Visit Date: 11/03/2023              Requested by: Joyce Norleen BROCKS, MD [address] [place],  [place] [address] PCP: Joyce Norleen BROCKS, MD  Chief Complaint  Patient presents with   Injections    Patient in today for Evinity injection she tolerated it well and will schedule for 30 days        HPI: injection  Assessment & Plan: Visit Diagnoses:  1. Age-related osteoporosis without current pathological fracture     Plan:   Follow-Up Instructions: No follow-ups on file.   Ortho Exam  Patient is alert, oriented, no adenopathy, well-dressed, normal affect, normal respiratory effort.     Imaging: No results found. No images are attached to the encounter.  Labs: Lab Results  Component Value Date   ESRSEDRATE 2 07/24/2016   CRP 1.5 07/24/2016     Lab Results  Component Value Date   ALBUMIN 4.5 03/26/2022   ALBUMIN 4.8 03/19/2021   ALBUMIN 4.8 03/05/2020    No results found for: MG Lab Results  Component Value Date   VD25OH 56 04/13/2023   VD25OH 34.7 03/26/2022   VD25OH 46.7 10/16/2020    No results found for: PREALBUMIN    Latest Ref Rng & Units 08/02/2023    8:22 AM 02/17/2023   12:12 AM 03/26/2022   10:29 AM  CBC EXTENDED  WBC 4.0 - 10.5 K/uL 7.3  9.4  6.3   RBC 3.87 - 5.11 MIL/uL 5.29  4.91  5.12   Hemoglobin 12.0 - 15.0 g/dL 83.7  84.4  84.8   HCT 36.0 - 46.0 % 48.6  45.7  45.5   Platelets 150 - 400 K/uL 369  305  319   NEUT# 1.4 - 7.0 x10E3/uL   3.4   Lymph# 0.7 - 3.1 x10E3/uL   1.8      There is no height or weight on file to calculate BMI.  Orders:  No orders of the defined types were placed in this encounter.  Meds ordered this encounter  Medications   Romosozumab -aqqg (EVENITY ) 105 MG/1. injection 210 mg    Restricted to Outpatient Use.  Do Not Tube.     Procedures: No procedures performed  Clinical  Data: No additional findings.  ROS:  All other systems negative, except as noted in the HPI. Review of Systems  Objective: Vital Signs: LMP  (LMP Unknown) Comment: sexually active  Specialty Comments:  MRI LUMBAR SPINE WITHOUT CONTRAST   TECHNIQUE: Multiplanar, multisequence MR imaging of the lumbar spine was performed. No intravenous contrast was administered.   COMPARISON:  CT of the chest, abdomen and pelvis 08/02/2023.   FINDINGS: Segmentation: Conventional anatomy assumed, with the last open disc space designated L5-S1.Concordant with prior CT.   Alignment: Convex left thoracolumbar scoliosis. No focal angulation or significant listhesis.   Vertebrae: No worrisome osseous lesion, acute fracture or pars defect. Stable chronic superior endplate compression deformity at T12. Chronic endplate degenerative changes at L4-5, asymmetric to the left. Asymmetric marrow edema within the left L5 pedicle and transverse process, corresponding with a probable nondisplaced fracture on recent previous CT. The visualized sacroiliac joints appear unremarkable.   Conus medullaris: Extends to the L1-2 level. The conus and cauda equina appear normal.   Paraspinal and other  soft tissues: No significant paraspinal findings.   Disc levels:   Sagittal images demonstrate no significant disc space findings within the visualized lower thoracic spine.   T12-L1: Mild disc bulging eccentric to the right. No spinal stenosis or significant foraminal narrowing.   L1-2: Mild disc bulging and endplate osteophytes asymmetric to the right. No spinal stenosis or significant foraminal narrowing.   L2-3: Mild disc bulging and endplate osteophytes asymmetric to the right. No spinal stenosis or significant foraminal narrowing.   L3-4: Preserved disc height and hydration. Mild bilateral facet hypertrophy. No spinal stenosis or significant foraminal narrowing.   L4-5: Chronic loss of disc height  with annular disc bulging and endplate osteophytes asymmetric to the left. Mild bilateral facet hypertrophy, also greater on the left. As above, asymmetric marrow edema within the left L5 pedicle and transverse process corresponding with a probable nondisplaced fracture on the recent abdominopelvic CT. The spinal canal is patent. There is mild narrowing of the left lateral recess and mild to moderate left foraminal narrowing without definite nerve root impingement.   L5-S1: Preserved disc height with disc bulging and endplate osteophytes asymmetric to the left. Asymmetric left-sided facet hypertrophy. No spinal stenosis. Mild left foraminal narrowing without definite nerve root impingement. The spinal canal and right foramen are patent.   IMPRESSION: 1. Asymmetric marrow edema within the left L5 pedicle and transverse process, corresponding with a probable nondisplaced fracture on the recent abdominopelvic CT. No displaced fracture or other acute osseous findings demonstrated. 2. Chronic spondylosis at L4-5 with asymmetric disc bulging and endplate osteophytes asymmetric to the left. There is mild narrowing of the left lateral recess and mild to moderate left foraminal narrowing without definite nerve root impingement. 3. Mild left foraminal narrowing at L5-S1 without definite nerve root impingement. 4. No spinal stenosis or definite nerve root impingement. 5. Convex left thoracolumbar scoliosis.     Electronically Signed   By: Elsie Perone M.D.   On: 09/06/2023 12:31  PMFS History: Patient Active Problem List   Diagnosis Date Noted   Hyperlipidemia 03/25/2022   Herpes labialis 02/16/2021   Gastroesophageal reflux disease without esophagitis 02/15/2018   Lactose intolerance 02/15/2018   History of vitamin D  deficiency 02/15/2018   Age-related osteoporosis without current pathological fracture 02/15/2018   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   Spondylosis 05/26/2007   Disorder  of gallbladder 03/11/2007   Past Medical History:  Diagnosis Date   Anxiety    very high   GERD (gastroesophageal reflux disease)    Glaucoma    Heart murmur    Pt saw Cardiologist when she was in her 88s, not since.   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   Osteoporosis    Scoliosis     Family History  Problem Relation Age of Onset   Arthritis Mother    Hyperlipidemia Mother    Atrial fibrillation Mother    Skin cancer Father    Depression Father    Parkinson's disease Father    CAD Father    Colon cancer Neg Hx    Colon polyps Neg Hx    Esophageal cancer Neg Hx    Stomach cancer Neg Hx    Rectal cancer Neg Hx     Past Surgical History:  Procedure Laterality Date   CESAREAN SECTION  1988   SHOULDER ARTHROSCOPY WITH SUBACROMIAL DECOMPRESSION AND OPEN ROTATOR C Right 12/09/2013   Procedure:  RIGHT SHOULDER ARTHROSCOPY WITH SUBACROMIAL DECOMPRESSION AND MINI OPEN ROTATOR CUFF REPAIR, AND BICEPS TENDODESIS;  Surgeon: Elspeth JONELLE Her, MD;  Location: [facility] OR;  Service: Orthopedics;  Laterality: Right;   TONSILLECTOMY     Social History   Occupational History   Occupation: Management/Librarian  Tobacco Use   Smoking status: Never   Smokeless tobacco: Never  Vaping Use   Vaping status: Never Used  Substance and Sexual Activity   Alcohol use: Yes    Comment: occ   Drug use: No   Sexual activity: Yes    Partners: Male    Birth control/protection: Post-menopausal    Comment: 1st intercourse- 23, partner- 1

## 2023-11-05 ENCOUNTER — Ambulatory Visit: Payer: Self-pay | Admitting: Cardiology

## 2023-11-05 ENCOUNTER — Other Ambulatory Visit: Payer: Self-pay | Admitting: Neurosurgery

## 2023-11-05 DIAGNOSIS — S32058D Other fracture of fifth lumbar vertebra, subsequent encounter for fracture with routine healing: Secondary | ICD-10-CM

## 2023-11-05 DIAGNOSIS — M5440 Lumbago with sciatica, unspecified side: Secondary | ICD-10-CM | POA: Diagnosis not present

## 2023-11-16 ENCOUNTER — Telehealth: Payer: Self-pay

## 2023-11-27 ENCOUNTER — Other Ambulatory Visit

## 2023-11-27 ENCOUNTER — Ambulatory Visit
Admission: RE | Admit: 2023-11-27 | Discharge: 2023-11-27 | Disposition: A | Discharge status: Home / Self Care | Source: Ambulatory Visit | Attending: Neurosurgery | Admitting: Neurosurgery

## 2023-11-27 DIAGNOSIS — M545 Low back pain, unspecified: Secondary | ICD-10-CM | POA: Diagnosis not present

## 2023-11-27 DIAGNOSIS — S32058D Other fracture of fifth lumbar vertebra, subsequent encounter for fracture with routine healing: Secondary | ICD-10-CM

## 2023-11-30 ENCOUNTER — Ambulatory Visit

## 2023-12-01 ENCOUNTER — Ambulatory Visit (INDEPENDENT_AMBULATORY_CARE_PROVIDER_SITE_OTHER)

## 2023-12-01 DIAGNOSIS — M810 Age-related osteoporosis without current pathological fracture: Secondary | ICD-10-CM | POA: Diagnosis not present

## 2023-12-01 NOTE — Progress Notes (Signed)
 Patient was here for Evenity  injection.   Patient received SQ injection in her right upper arm and in her left upper arm.  Patient tolerated both injections.  Advised to call with any questions or concerns.

## 2023-12-03 DIAGNOSIS — M4316 Spondylolisthesis, lumbar region: Secondary | ICD-10-CM | POA: Diagnosis not present

## 2023-12-07 ENCOUNTER — Telehealth: Payer: Self-pay

## 2023-12-07 ENCOUNTER — Telehealth: Payer: Self-pay | Admitting: Physician Assistant

## 2023-12-07 NOTE — Telephone Encounter (Signed)
 Returned patients call she wanted to schedule her next evenity  injection  she is scheduled for October 24 at 11am

## 2023-12-07 NOTE — Telephone Encounter (Signed)
 Pt called requesting a call back from [facility] concerning injection. Please call pt at [phone]

## 2023-12-08 NOTE — Progress Notes (Signed)
 Office Visit Note   Patient: [name]           Date of Birth: 02-14-1956           MRN: [id number] Visit Date: 08/14/2023              Requested by: Joyce Norleen BROCKS, MD [address] [place],  [place] [address] PCP: Joyce Norleen BROCKS, MD  No chief complaint on file.     HPI: Nurse visit  Assessment & Plan: Visit Diagnoses:  1. Age-related osteoporosis without current pathological fracture     Plan: nurse visit  Follow-Up Instructions: No follow-ups on file.   Ortho Exam  Patient is alert, oriented, no adenopathy, well-dressed, normal affect, normal respiratory effort.     Imaging: No results found. No images are attached to the encounter.  Labs: Lab Results  Component Value Date   ESRSEDRATE 2 07/24/2016   CRP 1.5 07/24/2016     Lab Results  Component Value Date   ALBUMIN 4.5 03/26/2022   ALBUMIN 4.8 03/19/2021   ALBUMIN 4.8 03/05/2020    No results found for: MG Lab Results  Component Value Date   VD25OH 56 04/13/2023   VD25OH 34.7 03/26/2022   VD25OH 46.7 10/16/2020    No results found for: PREALBUMIN    Latest Ref Rng & Units 08/02/2023    8:22 AM 02/17/2023   12:12 AM 03/26/2022   10:29 AM  CBC EXTENDED  WBC 4.0 - 10.5 K/uL 7.3  9.4  6.3   RBC 3.87 - 5.11 MIL/uL 5.29  4.91  5.12   Hemoglobin 12.0 - 15.0 g/dL 83.7  84.4  84.8   HCT 36.0 - 46.0 % 48.6  45.7  45.5   Platelets 150 - 400 K/uL 369  305  319   NEUT# 1.4 - 7.0 x10E3/uL   3.4   Lymph# 0.7 - 3.1 x10E3/uL   1.8      There is no height or weight on file to calculate BMI.  Orders:  No orders of the defined types were placed in this encounter.  Meds ordered this encounter  Medications   Romosozumab -aqqg (EVENITY ) 105 MG/1. injection 210 mg    Restricted to Outpatient Use.  Do Not Tube.     Procedures: No procedures performed  Clinical Data: No additional findings.  ROS:  All other systems negative, except as noted in the HPI. Review of  Systems  Objective: Vital Signs: LMP  (LMP Unknown) Comment: sexually active  Specialty Comments:  MRI LUMBAR SPINE WITHOUT CONTRAST   TECHNIQUE: Multiplanar, multisequence MR imaging of the lumbar spine was performed. No intravenous contrast was administered.   COMPARISON:  CT of the chest, abdomen and pelvis 08/02/2023.   FINDINGS: Segmentation: Conventional anatomy assumed, with the last open disc space designated L5-S1.Concordant with prior CT.   Alignment: Convex left thoracolumbar scoliosis. No focal angulation or significant listhesis.   Vertebrae: No worrisome osseous lesion, acute fracture or pars defect. Stable chronic superior endplate compression deformity at T12. Chronic endplate degenerative changes at L4-5, asymmetric to the left. Asymmetric marrow edema within the left L5 pedicle and transverse process, corresponding with a probable nondisplaced fracture on recent previous CT. The visualized sacroiliac joints appear unremarkable.   Conus medullaris: Extends to the L1-2 level. The conus and cauda equina appear normal.   Paraspinal and other soft tissues: No significant paraspinal findings.   Disc levels:   Sagittal images demonstrate no significant disc space findings within the visualized lower  thoracic spine.   T12-L1: Mild disc bulging eccentric to the right. No spinal stenosis or significant foraminal narrowing.   L1-2: Mild disc bulging and endplate osteophytes asymmetric to the right. No spinal stenosis or significant foraminal narrowing.   L2-3: Mild disc bulging and endplate osteophytes asymmetric to the right. No spinal stenosis or significant foraminal narrowing.   L3-4: Preserved disc height and hydration. Mild bilateral facet hypertrophy. No spinal stenosis or significant foraminal narrowing.   L4-5: Chronic loss of disc height with annular disc bulging and endplate osteophytes asymmetric to the left. Mild bilateral facet hypertrophy,  also greater on the left. As above, asymmetric marrow edema within the left L5 pedicle and transverse process corresponding with a probable nondisplaced fracture on the recent abdominopelvic CT. The spinal canal is patent. There is mild narrowing of the left lateral recess and mild to moderate left foraminal narrowing without definite nerve root impingement.   L5-S1: Preserved disc height with disc bulging and endplate osteophytes asymmetric to the left. Asymmetric left-sided facet hypertrophy. No spinal stenosis. Mild left foraminal narrowing without definite nerve root impingement. The spinal canal and right foramen are patent.   IMPRESSION: 1. Asymmetric marrow edema within the left L5 pedicle and transverse process, corresponding with a probable nondisplaced fracture on the recent abdominopelvic CT. No displaced fracture or other acute osseous findings demonstrated. 2. Chronic spondylosis at L4-5 with asymmetric disc bulging and endplate osteophytes asymmetric to the left. There is mild narrowing of the left lateral recess and mild to moderate left foraminal narrowing without definite nerve root impingement. 3. Mild left foraminal narrowing at L5-S1 without definite nerve root impingement. 4. No spinal stenosis or definite nerve root impingement. 5. Convex left thoracolumbar scoliosis.     Electronically Signed   By: Elsie Perone M.D.   On: 09/06/2023 12:31  PMFS History: Patient Active Problem List   Diagnosis Date Noted   Hyperlipidemia 03/25/2022   Herpes labialis 02/16/2021   Gastroesophageal reflux disease without esophagitis 02/15/2018   Lactose intolerance 02/15/2018   History of vitamin D  deficiency 02/15/2018   Age-related osteoporosis without current pathological fracture 02/15/2018   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   Spondylosis 05/26/2007   Disorder of gallbladder 03/11/2007   Past Medical History:  Diagnosis Date   Anxiety    very high   GERD  (gastroesophageal reflux disease)    Glaucoma    Heart murmur    Pt saw Cardiologist when she was in her 12s, not since.   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   Osteoporosis    Scoliosis     Family History  Problem Relation Age of Onset   Arthritis Mother    Hyperlipidemia Mother    Atrial fibrillation Mother    Skin cancer Father    Depression Father    Parkinson's disease Father    CAD Father    Colon cancer Neg Hx    Colon polyps Neg Hx    Esophageal cancer Neg Hx    Stomach cancer Neg Hx    Rectal cancer Neg Hx     Past Surgical History:  Procedure Laterality Date   CESAREAN SECTION  1988   SHOULDER ARTHROSCOPY WITH SUBACROMIAL DECOMPRESSION AND OPEN ROTATOR C Right 12/09/2013   Procedure: RIGHT SHOULDER ARTHROSCOPY WITH SUBACROMIAL DECOMPRESSION AND MINI OPEN ROTATOR CUFF REPAIR, AND BICEPS TENDODESIS;  Surgeon: Elspeth JONELLE Her, MD;  Location: [facility]  OR;  Service: Orthopedics;  Laterality: Right;   TONSILLECTOMY     Social History   Occupational History   Occupation: Management/Librarian  Tobacco Use   Smoking status: Never   Smokeless tobacco: Never  Vaping Use   Vaping status: Never Used  Substance and Sexual Activity   Alcohol use: Yes    Comment: occ   Drug use: No   Sexual activity: Yes    Partners: Male    Birth control/protection: Post-menopausal    Comment: 1st intercourse- 23, partner- 1

## 2023-12-10 ENCOUNTER — Telehealth: Payer: Self-pay

## 2023-12-10 NOTE — Telephone Encounter (Signed)
 Please review the order and advise.

## 2023-12-10 NOTE — Telephone Encounter (Signed)
 Copied from CRM [id number]. Topic: General - Other >> Dec 10, 2023  2:27 PM Fonda T wrote: Reason for CRM: Received call from patient, states she was advised by [facility] to contact office and speak to practice manager regarding a referral for an ultrasound, and billing issue.   Patient states, per [facility], Incorrect imaging test code was used according to [facility].  Imaging order was on 07/31/23 was placed for US  of Aorta, however, incorrect test was ordered, which was for an Abdominal ultrasound.   Insurance is now denying to cover imaging performed. Per [facility], advised patient to contact ordering provider's office practice manager to correct the issue.   Patient is requesting a follow call [phone].

## 2023-12-12 DIAGNOSIS — M51362 Other intervertebral disc degeneration, lumbar region with discogenic back pain and lower extremity pain: Secondary | ICD-10-CM | POA: Diagnosis not present

## 2023-12-14 ENCOUNTER — Other Ambulatory Visit: Payer: Self-pay | Admitting: Family Medicine

## 2023-12-18 ENCOUNTER — Other Ambulatory Visit: Payer: Self-pay | Admitting: Physical Medicine and Rehabilitation

## 2023-12-18 DIAGNOSIS — M47816 Spondylosis without myelopathy or radiculopathy, lumbar region: Secondary | ICD-10-CM

## 2023-12-18 NOTE — Progress Notes (Signed)
 Bilateral L4-5 and L5-S1 MBB per dr. Onetha, see media tab referral

## 2023-12-18 NOTE — Telephone Encounter (Signed)
 I have called [facility] and awaiting their investigation on this as Aorta US  was ordered.  Abdomen Aorta was done.  Patient aware we are investigating.

## 2023-12-18 NOTE — Telephone Encounter (Signed)
 Spoke with Izetta Leff at [facility] they will remove the charge for the Aorta US  they was done Abdominally.  Called patient reached voice mail lm re charge being removed. Also will send MY CHART message to patient.

## 2023-12-30 DIAGNOSIS — M51362 Other intervertebral disc degeneration, lumbar region with discogenic back pain and lower extremity pain: Secondary | ICD-10-CM | POA: Diagnosis not present

## 2024-01-01 ENCOUNTER — Ambulatory Visit (INDEPENDENT_AMBULATORY_CARE_PROVIDER_SITE_OTHER)

## 2024-01-01 DIAGNOSIS — M810 Age-related osteoporosis without current pathological fracture: Secondary | ICD-10-CM | POA: Diagnosis not present

## 2024-01-01 MED ORDER — ROMOSOZUMAB-AQQG 105 MG/1.17ML ~~LOC~~ SOSY: 210.0000 mg | PREFILLED_SYRINGE | SUBCUTANEOUS | Status: AC

## 2024-01-01 NOTE — Progress Notes (Signed)
 Patient was given the Evenity  injection by Me & Rosina B.

## 2024-01-07 DIAGNOSIS — M51362 Other intervertebral disc degeneration, lumbar region with discogenic back pain and lower extremity pain: Secondary | ICD-10-CM | POA: Diagnosis not present

## 2024-01-11 ENCOUNTER — Encounter: Payer: Self-pay | Admitting: Radiology

## 2024-01-12 ENCOUNTER — Encounter: Payer: Self-pay | Admitting: Physical Medicine and Rehabilitation

## 2024-01-12 ENCOUNTER — Ambulatory Visit: Admitting: Physical Medicine and Rehabilitation

## 2024-01-12 DIAGNOSIS — M545 Low back pain, unspecified: Secondary | ICD-10-CM

## 2024-01-12 DIAGNOSIS — M47816 Spondylosis without myelopathy or radiculopathy, lumbar region: Secondary | ICD-10-CM | POA: Diagnosis not present

## 2024-01-12 DIAGNOSIS — G8929 Other chronic pain: Secondary | ICD-10-CM | POA: Diagnosis not present

## 2024-01-12 NOTE — Progress Notes (Signed)
 [name] - 68 y.o. female MRN [id number]  Date of birth: 1955-11-01  Office Visit Note: Visit Date: 01/12/2024 PCP: Joyce Norleen BROCKS, MD Referred by: Joyce Norleen BROCKS, MD  Subjective: Chief Complaint  Patient presents with   Lower Back - Pain   HPI: [name] is a 68 y.o. female who comes in today as a self referral for evaluation of chronic left sided lower back pain, some referral to buttock and lateral hip region. She is patient of Dr. Arley Helling. We received referral from Dr. Helling to perform L4-L5 and L5-S1 medial branch blocks. Pain ongoing for several months, worsens with prolonged standing. She reports severe pain with household tasks such as cooking and cleaning. She describes pain as sore and aching sensation, currently rates as 1 out of 10. Some relief of pain with home exercise regimen, rest and use of medications. She does take Gabapentin  daily. She recently started PT with [facility], she reports significant relief of pain with these treatments.   Recent CT of lumbar spine shows interval healing of nondisplaced fracture involving the left L5 pedicle and transverse process. Stable chronic T12 compression deformity. Stable lower lumbar spondylosis with asymmetric left-sided facet hypertrophy at L4-5 and L5-S1 contributing to mild left lateral recess and left foraminal narrowing. She underwent left L5-S1 interlaminar epidural steroid injection in our office on 10/26/2023. No relief of pain with this procedure. Patient denies focal weakness, numbness and tingling. No recent trauma or falls.   She is currently being managed by Ronal Dragon Persons, PA from osteoporosis standpoint.        Review of Systems  Musculoskeletal:  Positive for back pain.  Neurological:  Negative for tingling, sensory change, focal weakness and weakness.  All other systems reviewed and are negative.  Otherwise per HPI.  Assessment & Plan: Visit Diagnoses:    ICD-10-CM   1. Chronic  left-sided low back pain without sciatica  M54.50    G89.29     2. Spondylosis without myelopathy or radiculopathy, lumbar region  M47.816     3. Facet arthropathy, lumbar  M47.816        Plan: Findings:  Chronic left sided lower back pain, some referral to buttock and lateral hip region.  Her pain has significantly improved with ongoing formal physical therapy.  She does continue to have some mild pain, especially with standing for long periods of time.  Her pain does seem to be more facet mediated.  Recent CT of the lumbar spine shows left sided facet hypertrophy at L4-L5 and L5-S1.  We discussed treatment plan in detail today.  Should her pain worsen we would consider performing diagnostic left L4-L5 and L5-S1 medial branch blocks under fluoroscopic guidance.  If good relief of pain with diagnostic blocks we discussed possibility of longer sustained pain relief with radiofrequency ablation.  I discussed double block paradigm and radiofrequency ablation procedure in detail today, she has no questions at this time.  Would recommend use of pre-procedure sedation for her. I encouraged patient to continue with physical therapy and also continue with home exercises post PT.  Our plan is to have her contact our office should her pain worsen.  She can continue follow-up with Dr. Helling as scheduled.  No red flag symptoms noted upon exam today.    Meds & Orders: No orders of the defined types were placed in this encounter.  No orders of the defined types were placed in this encounter.   Follow-up: Return if  symptoms worsen or fail to improve.   Procedures: No procedures performed      Clinical History: CLINICAL DATA:  Closed fracture of L5 vertebra. Patient fell nine months ago with back pain over the last 7 months. Left buttock and hip pain.   EXAM: CT LUMBAR SPINE WITHOUT CONTRAST   TECHNIQUE: Multidetector CT imaging of the lumbar spine was performed without intravenous contrast  administration. Multiplanar CT image reconstructions were also generated.   RADIATION DOSE REDUCTION: This exam was performed according to the departmental dose-optimization program which includes automated exposure control, adjustment of the mA and/or kV according to patient size and/or use of iterative reconstruction technique.   COMPARISON:  Cardiac CT 11/04/2023, lumbar MRI 08/18/2023 and CTA of the chest, abdomen and pelvis 08/02/2023.   FINDINGS: Segmentation: There are 5 lumbar type vertebral bodies.   Alignment: Moderate convex left scoliosis. No focal angulation or listhesis.   Vertebrae: Unchanged chronic T12 compression deformity. No acute lumbar spine vertebral body fractures are identified. A nondisplaced fracture involving the left L5 pedicle and transverse process on CT of 08/02/2023 appears healed in the interval (best seen on axial image 84/5).   Paraspinal and other soft tissues: No acute paraspinal findings are identified. Minimal aortoiliac atherosclerosis. Low-density lesion in the right hepatic lobe on image 7/6 has been previously identified and appears grossly unchanged.   Disc levels: No acute or significant disc space findings from T12-L1 through L3-4. The disc heights are maintained at these levels, and there is no significant spinal stenosis or foraminal narrowing. At L4-5, there is chronic loss of disc height with annular disc bulging, endplate osteophytes and asymmetric left-sided facet hypertrophy. Resulting mild left lateral recess and mild to moderate left foraminal narrowing appears unchanged from previous MRI. At L5-S1, there is mild loss of disc height with disc bulging and endplate osteophytes asymmetric to the left. Advanced asymmetric facet hypertrophy on the left with osseous fragmentation and mild asymmetric narrowing of the left lateral recess and left foramen, also grossly stable.   IMPRESSION: 1. No acute osseous findings are  identified in the lumbar spine. 2. Interval healing of nondisplaced fracture involving the left L5 pedicle and transverse process. 3. Stable chronic T12 compression deformity. 4. Stable lower lumbar spondylosis with asymmetric left-sided facet hypertrophy at L4-5 and L5-S1 contributing to mild left lateral recess and left foraminal narrowing. 5.  Aortic Atherosclerosis (ICD10-I70.0).     Electronically Signed   By: Elsie Perone M.D.   On: 12/01/2023 11:21   She reports that she has never smoked. She has never used smokeless tobacco. No results for input(s): HGBA1C, LABURIC in the last 8760 hours.  Objective:  VS:  HT:    WT:   BMI:     BP:   HR: bpm  TEMP: ( )  RESP:  Physical Exam Vitals and nursing note reviewed.  HENT:     Head: Normocephalic and atraumatic.     Right Ear: External ear normal.     Left Ear: External ear normal.     Nose: Nose normal.     Mouth/Throat:     Mouth: Mucous membranes are moist.  Eyes:     Extraocular Movements: Extraocular movements intact.  Cardiovascular:     Rate and Rhythm: Normal rate.     Pulses: Normal pulses.  Pulmonary:     Effort: Pulmonary effort is normal.  Abdominal:     General: Abdomen is flat. There is distension.  Musculoskeletal:  General: Tenderness present.     Cervical back: Normal range of motion.     Comments: Patient rises from seated position to standing without difficulty. Pain noted with facet loading and lumbar extension. 5/5 strength noted with bilateral hip flexion, knee flexion/extension, ankle dorsiflexion/plantarflexion and EHL. No clonus noted bilaterally. No pain upon palpation of greater trochanters. No pain with internal/external rotation of bilateral hips. Sensation intact bilaterally. Negative slump test bilaterally. Ambulates without aid, gait steady.     Skin:    General: Skin is warm and dry.     Capillary Refill: Capillary refill takes less than 2 seconds.  Neurological:      General: No focal deficit present.     Mental Status: She is alert and oriented to person, place, and time.  Psychiatric:        Mood and Affect: Mood normal.        Behavior: Behavior normal.     Ortho Exam  Imaging: No results found.  Past Medical/Family/Surgical/Social History: Medications & Allergies reviewed per EMR, new medications updated. 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   Osteoporosis    Scoliosis    Family History  Problem Relation Age of Onset   Arthritis Mother    Hyperlipidemia Mother    Atrial fibrillation Mother    Skin cancer Father    Depression Father    Parkinson's disease Father    CAD Father    Colon cancer Neg Hx    Colon polyps Neg Hx    Esophageal cancer Neg Hx    Stomach cancer Neg Hx    Rectal cancer Neg Hx    Past Surgical History:  Procedure Laterality Date   CESAREAN SECTION  1988   SHOULDER ARTHROSCOPY WITH SUBACROMIAL DECOMPRESSION AND OPEN ROTATOR C Right 12/09/2013   Procedure: RIGHT SHOULDER ARTHROSCOPY WITH SUBACROMIAL DECOMPRESSION AND MINI OPEN ROTATOR CUFF REPAIR, AND BICEPS TENDODESIS;  Surgeon: Elspeth JONELLE Her, MD;  Location: [facility] OR;  Service: Orthopedics;  Laterality: Right;   TONSILLECTOMY     Social History   Occupational History    Occupation: Management/Librarian  Tobacco Use   Smoking status: Never   Smokeless tobacco: Never  Vaping Use   Vaping status: Never Used  Substance and Sexual Activity   Alcohol use: Yes    Comment: occ   Drug use: No   Sexual activity: Yes    Partners: Male    Birth control/protection: Post-menopausal    Comment: 1st intercourse- 23, partner- 1

## 2024-01-12 NOTE — Progress Notes (Signed)
 Pain Scale   Average Pain 1 Patient advising she has chronic lower back pain that radiates to left hip area. However, pain is actually manageable with the new PT that she is doing.        +Driver, -BT, -Dye Allergies.

## 2024-01-16 ENCOUNTER — Ambulatory Visit
Admission: RE | Admit: 2024-01-16 | Discharge: 2024-01-16 | Disposition: A | Discharge status: Home / Self Care | Source: Ambulatory Visit | Attending: Family Medicine

## 2024-01-16 VITALS — BP 120/71 | HR 103 | Temp 99.5°F | Resp 15

## 2024-01-16 DIAGNOSIS — R052 Subacute cough: Secondary | ICD-10-CM | POA: Diagnosis not present

## 2024-01-16 DIAGNOSIS — B349 Viral infection, unspecified: Secondary | ICD-10-CM | POA: Diagnosis not present

## 2024-01-16 LAB — POC COVID19/FLU A&B COMBO
Covid Antigen, POC: NEGATIVE
Influenza A Antigen, POC: NEGATIVE
Influenza B Antigen, POC: NEGATIVE

## 2024-01-16 MED ORDER — IBUPROFEN 600 MG PO TABS: 600.0000 mg | ORAL_TABLET | Freq: Four times a day (QID) | ORAL | 0 refills | Status: AC | PRN

## 2024-01-16 MED ORDER — PSEUDOEPHEDRINE HCL 30 MG PO TABS: 30.0000 mg | ORAL_TABLET | Freq: Three times a day (TID) | ORAL | 0 refills | Status: AC | PRN

## 2024-01-16 MED ORDER — PROMETHAZINE-DM 6.25-15 MG/5ML PO SYRP: 5.0000 mL | ORAL_SOLUTION | Freq: Every evening | ORAL | 0 refills | Status: DC | PRN

## 2024-01-16 NOTE — ED Triage Notes (Signed)
 Pt present with c/o sore throat x last night. Pt states she has been aching. States the sore throat has gotten better. C/o fatigue and a mild cough.   Home interventions: Zyrtec yesterday @ 1030am

## 2024-01-16 NOTE — ED Provider Notes (Signed)
 [facility]  Note:  This document was prepared using Conservation officer, historic buildings and may include unintentional dictation errors.  MRN: [id number] DOB: [birth year]  Subjective:   [name] is a 68 y.o. female presenting for 1 day history of acute onset scratchy throat, mild cough, malaise and fatigue.  At worst symptoms this morning with laryngitis.  As the day has progressed, reports that she feels better.  She did take Zyrtec last night.  No chest pain, shortness of breath or wheezing.  No asthma.  No smoking of any kind including cigarettes, cigars, vaping, marijuana use.  No drug use.   Current Facility-Administered Medications:    [START ON 01/31/2024] Romosozumab -aqqg (EVENITY ) 105 MG/1. injection 210 mg, 210 mg, Subcutaneous, Q30 days, Persons, Ronal Dragon, [place]  Current Outpatient Medications:    aspirin EC 81 MG tablet, Take 81 mg by mouth daily., Disp: , Rfl:    Cholecalciferol (VITAMIN D ) 2000 UNITS CAPS, Take 2,000 Units by mouth daily., Disp: , Rfl:    cycloSPORINE (RESTASIS) 0.05 % ophthalmic emulsion, Place 1 drop into both eyes 2 (two) times daily., Disp: , Rfl:    Estradiol  (YUVAFEM ) 10 MCG TABS vaginal tablet, PLACE 1 INSERT VAGINALLY TWICE WEEKLY ON TUESDAYS AND FRIDAYS, Disp: 24 tablet, Rfl: 2   famotidine (PEPCID) 20 MG tablet, Take 10 mg by mouth daily. , Disp: , Rfl:    gabapentin  (NEURONTIN ) 300 MG capsule, Take 300 mg by mouth 2 (two) times daily., Disp: , Rfl:    latanoprost (XALATAN) 0.005 % ophthalmic solution, Place 1 drop into both eyes at bedtime., Disp: , Rfl:    Omega-3 Fatty Acids (FISH OIL PO), Take 1 capsule by mouth daily., Disp: , Rfl:    acetaminophen  (TYLENOL ) 325 MG tablet, Take 325 mg by mouth every 6 (six) hours as needed., Disp: , Rfl:    diazepam  (VALIUM ) 5 MG tablet, Take one tablet by mouth with light food one hour prior to procedure., Disp: 1 tablet, Rfl: 0   ibuprofen (ADVIL) 200 MG tablet, Take 200 mg by  mouth every 6 (six) hours as needed., Disp: , Rfl:    No Known Allergies  Past Medical History:  Diagnosis Date   Anxiety    very high   GERD (gastroesophageal reflux disease)    Glaucoma    Heart murmur    Pt saw Cardiologist when she was in her 58s, not since.   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   Osteoporosis    Scoliosis      Past Surgical History:  Procedure Laterality Date   CESAREAN SECTION  1988   SHOULDER ARTHROSCOPY WITH SUBACROMIAL DECOMPRESSION AND OPEN ROTATOR C Right 12/09/2013   Procedure: RIGHT SHOULDER ARTHROSCOPY WITH SUBACROMIAL DECOMPRESSION AND MINI OPEN ROTATOR CUFF REPAIR, AND BICEPS TENDODESIS;  Surgeon: Elspeth JONELLE Her, MD;  Location: [facility] OR;  Service: Orthopedics;  Laterality: Right;   TONSILLECTOMY      Family History  Problem Relation Age of Onset   Arthritis Mother    Hyperlipidemia Mother    Atrial fibrillation Mother    Skin cancer Father    Depression Father    Parkinson's disease Father    CAD Father    Colon cancer Neg Hx    Colon polyps Neg Hx    Esophageal cancer Neg Hx    Stomach cancer Neg Hx  Rectal cancer Neg Hx     Social History   Tobacco Use   Smoking status: Never   Smokeless tobacco: Never  Vaping Use   Vaping status: Never Used  Substance Use Topics   Alcohol use: Yes    Comment: occ   Drug use: No    ROS   Objective:   Vitals: BP 120/71   Pulse (!) 103   Temp 99.5 F (37.5 C)   Resp 15   LMP  (LMP Unknown) Comment: sexually active  SpO2 96%   Physical Exam Constitutional:      General: She is not in acute distress.    Appearance: Normal appearance. She is well-developed and normal weight. She is not ill-appearing, toxic-appearing or diaphoretic.  HENT:     Head: Normocephalic and atraumatic.     Right Ear: Tympanic membrane, ear canal and external ear normal. No drainage or tenderness. No middle ear effusion. There  is no impacted cerumen. Tympanic membrane is not erythematous or bulging.     Left Ear: Tympanic membrane, ear canal and external ear normal. No drainage or tenderness.  No middle ear effusion. There is no impacted cerumen. Tympanic membrane is not erythematous or bulging.     Nose: Nose normal. No congestion or rhinorrhea.     Mouth/Throat:     Mouth: Mucous membranes are moist. No oral lesions.     Pharynx: No pharyngeal swelling, oropharyngeal exudate, posterior oropharyngeal erythema or uvula swelling.     Tonsils: No tonsillar exudate or tonsillar abscesses.  Eyes:     General: No scleral icterus.       Right eye: No discharge.        Left eye: No discharge.     Extraocular Movements: Extraocular movements intact.     Right eye: Normal extraocular motion.     Left eye: Normal extraocular motion.     Conjunctiva/sclera: Conjunctivae normal.  Cardiovascular:     Rate and Rhythm: Normal rate and regular rhythm.     Heart sounds: Normal heart sounds. No murmur heard.    No friction rub. No gallop.  Pulmonary:     Effort: Pulmonary effort is normal. No respiratory distress.     Breath sounds: No stridor. No wheezing, rhonchi or rales.  Chest:     Chest wall: No tenderness.  Musculoskeletal:     Cervical back: Normal range of motion and neck supple.  Lymphadenopathy:     Cervical: No cervical adenopathy.  Skin:    General: Skin is warm and dry.  Neurological:     General: No focal deficit present.     Mental Status: She is alert and oriented to person, place, and time.  Psychiatric:        Mood and Affect: Mood normal.        Behavior: Behavior normal.     Results for orders placed or performed during the hospital encounter of 01/16/24 (from the past 24 hours)  POC Covid19/Flu A&B Antigen     Status: None   Collection Time: 01/16/24 10:23 AM  Result Value Ref Range   Influenza A Antigen, POC Negative Negative   Influenza B Antigen, POC Negative Negative   Covid Antigen,  POC Negative Negative    Assessment and Plan :   PDMP not reviewed this encounter.  1. Acute viral syndrome   2. Subacute cough    Deferred imaging given clear cardiopulmonary exam, hemodynamically stable vital signs.  Suspect viral URI, viral syndrome. Physical exam findings  reassuring and vital signs stable for discharge. Advised supportive care, offered symptomatic relief. Counseled patient on potential for adverse effects with medications prescribed/recommended today, ER and return-to-clinic precautions discussed, patient verbalized understanding.     Christopher Savannah, [place] 01/16/24 1115

## 2024-01-16 NOTE — Discharge Instructions (Signed)
 We will manage this as a viral illness. For sore throat or cough try using a honey-based tea. Use 3 teaspoons of honey with juice squeezed from half lemon. Place shaved pieces of ginger into 1/2-1 cup of water and warm over stove top. Then mix the ingredients and repeat every 4 hours as needed. Please take ibuprofen 600mg  every 6 hours with food alternating with OR taken together with Tylenol  500mg -650mg  every 6 hours for throat pain, fevers, aches and pains. Hydrate very well with at least 2 liters of water. Eat light meals such as soups (chicken and noodles, vegetable, chicken and wild rice).  Do not eat foods that you are allergic to.  Taking an antihistamine like Zyrtec (10mg  daily) can help against postnasal drainage, sinus congestion which can cause sinus pain, sinus headaches, throat pain, painful swallowing, coughing.  You can take this together with pseudoephedrine (Sudafed) at a dose of 30mg  3 times a day or twice daily as needed for the same kind of nasal drip, congestion.  Use cough syrup at bedtime.

## 2024-01-27 DIAGNOSIS — M51362 Other intervertebral disc degeneration, lumbar region with discogenic back pain and lower extremity pain: Secondary | ICD-10-CM | POA: Diagnosis not present

## 2024-02-02 ENCOUNTER — Encounter: Admitting: Physical Medicine and Rehabilitation

## 2024-02-02 ENCOUNTER — Ambulatory Visit: Admitting: Physician Assistant

## 2024-02-02 DIAGNOSIS — M810 Age-related osteoporosis without current pathological fracture: Secondary | ICD-10-CM

## 2024-02-02 MED ORDER — ROMOSOZUMAB-AQQG 105 MG/1.17ML ~~LOC~~ SOSY
210.0000 mg | PREFILLED_SYRINGE | SUBCUTANEOUS | Status: AC
  Administered 2024-02-02: 210 mg via SUBCUTANEOUS

## 2024-02-02 NOTE — Addendum Note (Signed)
 Addended by: EILLEEN ROSINA HERO on: 02/02/2024 02:51 PM   Modules accepted: Orders

## 2024-02-02 NOTE — Progress Notes (Signed)
 Office Visit Note   Patient: [name]           Date of Birth: May 07, 1955           MRN: [id number] Visit Date: 02/02/2024              Requested by: Joyce Norleen BROCKS, MD [address] [place],  [place] [address] PCP: Joyce Norleen BROCKS, MD  No chief complaint on file.     HPI: Patient comes in for her ninth Evenity  injection.  Been very tolerant of the injections in the past denies any myalgias body aching or paresthesias.  Assessment & Plan: Visit Diagnoses:  1. Age-related osteoporosis without current pathological fracture     Plan: Injection administered SQ without difficulty will follow-up in a month calcium drawn today  Follow-Up Instructions: No follow-ups on file.   Ortho Exam  Patient is alert, oriented, no adenopathy, well-dressed, normal affect, normal respiratory effort.     Imaging: No results found. No images are attached to the encounter.  Labs: Lab Results  Component Value Date   ESRSEDRATE 2 07/24/2016   CRP 1.5 07/24/2016     Lab Results  Component Value Date   ALBUMIN 4.5 03/26/2022   ALBUMIN 4.8 03/19/2021   ALBUMIN 4.8 03/05/2020    No results found for: MG Lab Results  Component Value Date   VD25OH 56 04/13/2023   VD25OH 34.7 03/26/2022   VD25OH 46.7 10/16/2020    No results found for: PREALBUMIN    Latest Ref Rng & Units 08/02/2023    8:22 AM 02/17/2023   12:12 AM 03/26/2022   10:29 AM  CBC EXTENDED  WBC 4.0 - 10.5 K/uL 7.3  9.4  6.3   RBC 3.87 - 5.11 MIL/uL 5.29  4.91  5.12   Hemoglobin 12.0 - 15.0 g/dL 83.7  84.4  84.8   HCT 36.0 - 46.0 % 48.6  45.7  45.5   Platelets 150 - 400 K/uL 369  305  319   NEUT# 1.4 - 7.0 x10E3/uL   3.4   Lymph# 0.7 - 3.1 x10E3/uL   1.8      There is no height or weight on file to calculate BMI.  Orders:  No orders of the defined types were placed in this encounter.  Meds ordered this encounter  Medications   Romosozumab -aqqg (EVENITY ) 105 MG/1. injection 210 mg     Restricted to Outpatient Use.  Do Not Tube.     Procedures: No procedures performed  Clinical Data: No additional findings.  ROS:  All other systems negative, except as noted in the HPI. Review of Systems  Objective: Vital Signs: LMP  (LMP Unknown) Comment: sexually active  Specialty Comments:  CLINICAL DATA:  Closed fracture of L5 vertebra. Patient fell nine months ago with back pain over the last 7 months. Left buttock and hip pain.   EXAM: CT LUMBAR SPINE WITHOUT CONTRAST   TECHNIQUE: Multidetector CT imaging of the lumbar spine was performed without intravenous contrast administration. Multiplanar CT image reconstructions were also generated.   RADIATION DOSE REDUCTION: This exam was performed according to the departmental dose-optimization program which includes automated exposure control, adjustment of the mA and/or kV according to patient size and/or use of iterative reconstruction technique.   COMPARISON:  Cardiac CT 11/04/2023, lumbar MRI 08/18/2023 and CTA of the chest, abdomen and pelvis 08/02/2023.   FINDINGS: Segmentation: There are 5 lumbar type vertebral bodies.   Alignment: Moderate convex left scoliosis. No focal angulation or  listhesis.   Vertebrae: Unchanged chronic T12 compression deformity. No acute lumbar spine vertebral body fractures are identified. A nondisplaced fracture involving the left L5 pedicle and transverse process on CT of 08/02/2023 appears healed in the interval (best seen on axial image 84/5).   Paraspinal and other soft tissues: No acute paraspinal findings are identified. Minimal aortoiliac atherosclerosis. Low-density lesion in the right hepatic lobe on image 7/6 has been previously identified and appears grossly unchanged.   Disc levels: No acute or significant disc space findings from T12-L1 through L3-4. The disc heights are maintained at these levels, and there is no significant spinal stenosis or foraminal  narrowing. At L4-5, there is chronic loss of disc height with annular disc bulging, endplate osteophytes and asymmetric left-sided facet hypertrophy. Resulting mild left lateral recess and mild to moderate left foraminal narrowing appears unchanged from previous MRI. At L5-S1, there is mild loss of disc height with disc bulging and endplate osteophytes asymmetric to the left. Advanced asymmetric facet hypertrophy on the left with osseous fragmentation and mild asymmetric narrowing of the left lateral recess and left foramen, also grossly stable.   IMPRESSION: 1. No acute osseous findings are identified in the lumbar spine. 2. Interval healing of nondisplaced fracture involving the left L5 pedicle and transverse process. 3. Stable chronic T12 compression deformity. 4. Stable lower lumbar spondylosis with asymmetric left-sided facet hypertrophy at L4-5 and L5-S1 contributing to mild left lateral recess and left foraminal narrowing. 5.  Aortic Atherosclerosis (ICD10-I70.0).     Electronically Signed   By: Elsie Perone M.D.   On: 12/01/2023 11:21  PMFS History: Patient Active Problem List   Diagnosis Date Noted   Hyperlipidemia 03/25/2022   Herpes labialis 02/16/2021   Gastroesophageal reflux disease without esophagitis 02/15/2018   Lactose intolerance 02/15/2018   History of vitamin D  deficiency 02/15/2018   Age-related osteoporosis without current pathological fracture 02/15/2018   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   Spondylosis 05/26/2007   Disorder of gallbladder 03/11/2007   Past Medical History:  Diagnosis Date   Anxiety    very high   GERD (gastroesophageal reflux disease)    Glaucoma    Heart murmur    Pt saw Cardiologist when she was in her 72s, not since.   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   Osteoporosis    Scoliosis     Family History  Problem Relation Age of Onset   Arthritis  Mother    Hyperlipidemia Mother    Atrial fibrillation Mother    Skin cancer Father    Depression Father    Parkinson's disease Father    CAD Father    Colon cancer Neg Hx    Colon polyps Neg Hx    Esophageal cancer Neg Hx    Stomach cancer Neg Hx    Rectal cancer Neg Hx     Past Surgical History:  Procedure Laterality Date   CESAREAN SECTION  1988   SHOULDER ARTHROSCOPY WITH SUBACROMIAL DECOMPRESSION AND OPEN ROTATOR C Right 12/09/2013   Procedure: RIGHT SHOULDER ARTHROSCOPY WITH SUBACROMIAL DECOMPRESSION AND MINI OPEN ROTATOR CUFF REPAIR, AND BICEPS TENDODESIS;  Surgeon: Elspeth JONELLE Her, MD;  Location: [facility] OR;  Service: Orthopedics;  Laterality: Right;   TONSILLECTOMY     Social History   Occupational History   Occupation: Management/Librarian  Tobacco Use   Smoking status: Never   Smokeless tobacco: Never  Vaping  Use   Vaping status: Never Used  Substance and Sexual Activity   Alcohol use: Yes    Comment: occ   Drug use: No   Sexual activity: Yes    Partners: Male    Birth control/protection: Post-menopausal    Comment: 1st intercourse- 23, partner- 1

## 2024-02-03 LAB — CALCIUM: Calcium: 9.4 mg/dL (ref 8.6–10.4)

## 2024-02-17 DIAGNOSIS — M51362 Other intervertebral disc degeneration, lumbar region with discogenic back pain and lower extremity pain: Secondary | ICD-10-CM | POA: Diagnosis not present

## 2024-02-19 ENCOUNTER — Encounter: Payer: Self-pay | Admitting: Gastroenterology

## 2024-02-19 ENCOUNTER — Ambulatory Visit: Admitting: Gastroenterology

## 2024-02-19 VITALS — BP 110/78 | HR 68 | Ht 63.0 in | Wt 139.0 lb

## 2024-02-19 DIAGNOSIS — K449 Diaphragmatic hernia without obstruction or gangrene: Secondary | ICD-10-CM

## 2024-02-19 DIAGNOSIS — K219 Gastro-esophageal reflux disease without esophagitis: Secondary | ICD-10-CM

## 2024-02-19 DIAGNOSIS — K222 Esophageal obstruction: Secondary | ICD-10-CM

## 2024-02-19 DIAGNOSIS — R1013 Epigastric pain: Secondary | ICD-10-CM | POA: Insufficient documentation

## 2024-02-19 DIAGNOSIS — K2100 Gastro-esophageal reflux disease with esophagitis, without bleeding: Secondary | ICD-10-CM

## 2024-02-19 MED ORDER — FAMOTIDINE 20 MG PO TABS: 20.0000 mg | ORAL_TABLET | Freq: Two times a day (BID) | ORAL | 5 refills | Status: AC

## 2024-02-19 NOTE — Progress Notes (Signed)
 02/19/2024 [name] [id number] [birth year]  Discussed the use of AI scribe software for clinical note transcription with the patient, who gave verbal consent to proceed.  History of Present Illness [name] is a 68 year old female with a history of hiatal hernia who presents with epigastric abdominal pain and gastrointestinal symptoms.  She is a patient of Dr. Trenna.  Last seen here in 2019.  She experiences epigastric abdominal pain, which occasionally becomes severe, particularly at night. A particularly painful episode occurred about a month ago after consuming a milk-based white chili, which she suspects may have triggered the pain. The pain is described as 'really bad' and is alleviated with a heating pad. It does not radiate and typically resolves by the next day. During these episodes, she also experiences bloating and a sensation of tight clothing.  She reports soreness upon waking and sitting up in the morning, which she describes as a 'shift' that causes discomfort. She takes famotidine 10 mg nightly, increasing to 20 mg if symptoms worsen, which she believes controls her symptoms most of the time. If she skips a dose, she experiences heartburn and acid reflux symptoms at night. She has a history of a small hiatal hernia and esophageal narrowing. No significant issues with food getting stuck, except occasionally when eating quickly, such as with an Egg McMuffin in the morning.  Her past medical history includes a CT scan of the chest, abdomen, and pelvis in May 2025, which showed no significant abdominal abnormalities but did reveal a compression fracture in her back and a sternal fracture, but no intra-abdominal concerns.  She has a history of spondylosis with deterioration of the L4 and L5 vertebrae, for which she previously took Advil  daily. She stopped taking Advil  about three to four weeks ago and reports some improvement in her pain since then. She  describes her pain as a combination of severe episodes and constant discomfort.  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   EGD 07/2017: - Esophagogastric landmarks identified. - Benign- appearing esophageal stenosis. - Small hiatal hernia. - Normal examined duodenum. - No specimens collected.   Past Medical History:  Diagnosis Date   Anxiety    very high   GERD (gastroesophageal reflux disease)    Glaucoma    Heart murmur    Pt saw Cardiologist when she was in her 24s, not since.   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   Osteoporosis    Scoliosis    Past Surgical History:  Procedure Laterality Date   CESAREAN SECTION  1988   SHOULDER ARTHROSCOPY WITH SUBACROMIAL DECOMPRESSION AND OPEN ROTATOR C Right 12/09/2013   Procedure: RIGHT SHOULDER ARTHROSCOPY WITH SUBACROMIAL DECOMPRESSION AND MINI OPEN ROTATOR CUFF REPAIR, AND BICEPS TENDODESIS;  Surgeon: Elspeth JONELLE Her, MD;  Location: [facility];  Service: Orthopedics;  Laterality: Right;   TONSILLECTOMY      reports that she has never smoked. She has never used smokeless tobacco. She reports current alcohol use. She reports that she does not use drugs. family history includes Arthritis in her mother; Atrial fibrillation in her mother; CAD in her father; Depression in her father; Hyperlipidemia in her mother; Parkinson's disease in her father; Skin cancer in her father. Allergies[1]    Outpatient Encounter Medications as  of 02/19/2024  Medication Sig   acetaminophen  (TYLENOL ) 325 MG tablet Take 325 mg by mouth every 6 (six) hours as needed.   aspirin EC 81 MG tablet Take 81 mg by mouth daily.   Cholecalciferol (VITAMIN D ) 2000 UNITS CAPS Take 2,000 Units by mouth daily.   cycloSPORINE (RESTASIS) 0.05 % ophthalmic emulsion Place 1 drop into both eyes 2 (two) times  daily.   diazepam  (VALIUM ) 5 MG tablet Take one tablet by mouth with light food one hour prior to procedure.   Estradiol  (YUVAFEM ) 10 MCG TABS vaginal tablet PLACE 1 INSERT VAGINALLY TWICE WEEKLY ON TUESDAYS AND FRIDAYS   famotidine (PEPCID) 20 MG tablet Take 10 mg by mouth daily.    gabapentin  (NEURONTIN ) 300 MG capsule Take 300 mg by mouth 2 (two) times daily.   ibuprofen  (ADVIL ) 600 MG tablet Take 1 tablet (600 mg total) by mouth every 6 (six) hours as needed.   latanoprost (XALATAN) 0.005 % ophthalmic solution Place 1 drop into both eyes at bedtime.   Omega-3 Fatty Acids (FISH OIL PO) Take 1 capsule by mouth daily.   promethazine -dextromethorphan (PROMETHAZINE -DM) 6.25-15 MG/5ML syrup Take 5 mLs by mouth at bedtime as needed for cough.   pseudoephedrine  (SUDAFED) 30 MG tablet Take 1 tablet (30 mg total) by mouth every 8 (eight) hours as needed for congestion.   Facility-Administered Encounter Medications as of 02/19/2024  Medication   Romosozumab -aqqg (EVENITY ) 105 MG/1. injection 210 mg     REVIEW OF SYSTEMS  : All other systems reviewed and negative except where noted in the History of Present Illness.   PHYSICAL EXAM: BP 110/78   Pulse 68   Ht 5' 3 (1.6 m)   Wt 139 lb (63 kg)   LMP  (LMP Unknown) Comment: sexually active  BMI 24.62 kg/m  General: Well developed white female in no acute distress Head: Normocephalic and atraumatic Eyes:  Sclerae anicteric, conjunctiva pink. Ears: Normal auditory acuity Lungs: Clear throughout to auscultation; no W/R/R. Heart: Regular rate and rhythm; no M/R/G. Abdomen: Soft, non-distended.  BS present.  Mild epigastric TTP. Musculoskeletal: Symmetrical with no gross deformities  Skin: No lesions on visible extremities Neurological: Alert oriented x 4, grossly non-focal Psychological:  Alert and cooperative. Normal mood and affect  Assessment & Plan Gastroesophageal reflux disease with esophagitis Intermittent nocturnal pain and  discomfort in the epigastrium, likely due to GERD with esophagitis. Symptoms managed with famotidine 10 mg nightly, occasionally increased to 20 mg. No significant esophageal inflammation on previous endoscopy. Prefers famotidine over PPIs due to concerns about long-term side effects, including osteoporosis. Discussed potential for short-term PPI use if symptoms persist and endoscopy shows inflammation. Endoscopy considered if symptoms do not improve with increased famotidine. - Increased famotidine to 20 mg twice daily.  Prescription sent to pharmacy. - Will consider endoscopy if symptoms do not improve in 6-8 weeks.  Follow-up appt made.  Hiatal hernia and esophageal stenosis Small hiatal hernia and esophageal stenosis noted on previous endoscopy. No significant inflammation. Symptoms include occasional sensation of food getting stuck, particularly with fast eating. No current need for intervention as symptoms are managed with lifestyle modifications and famotidine. - Continue current management with famotidine and lifestyle modifications.   CC:  Joyce Norleen BROCKS, MD       [1] No Known Allergies

## 2024-02-19 NOTE — Patient Instructions (Signed)
 We have sent the following medications to your pharmacy for you to pick up at your convenience: Famotidine 20 mg twice daily.   _______________________________________________________  If your blood pressure at your visit was 140/90 or greater, please contact your primary care physician to follow up on this.  _______________________________________________________  If you are age 67 or older, your body mass index should be between 23-30. Your Body mass index is 24.62 kg/m. If this is out of the aforementioned range listed, please consider follow up with your Primary Care Provider.  If you are age 47 or younger, your body mass index should be between 19-25. Your Body mass index is 24.62 kg/m. If this is out of the aformentioned range listed, please consider follow up with your Primary Care Provider.   ________________________________________________________  The [facility] providers would like to encourage you to use MYCHART to communicate with providers for non-urgent requests or questions.  Due to long hold times on the telephone, sending your provider a message by [facility] may be a faster and more efficient way to get a response.  Please allow 48 business hours for a response.  Please remember that this is for non-urgent requests.  _______________________________________________________  [facility] is using a team-based approach to care.  Your team is made up of your doctor and two to three APPS. Our APPS (Nurse Practitioners and Physician Assistants) work with your physician to ensure care continuity for you. They are fully qualified to address your health concerns and develop a treatment plan. They communicate directly with your gastroenterologist to care for you. Seeing the Advanced Practice Practitioners on your physician's team can help you by facilitating care more promptly, often allowing for earlier appointments, access to diagnostic testing, procedures, and other specialty  referrals.

## 2024-02-26 ENCOUNTER — Encounter: Payer: Self-pay | Admitting: Sports Medicine

## 2024-03-08 ENCOUNTER — Ambulatory Visit: Admitting: Physician Assistant

## 2024-03-09 ENCOUNTER — Ambulatory Visit: Admitting: Physician Assistant

## 2024-03-09 DIAGNOSIS — M810 Age-related osteoporosis without current pathological fracture: Secondary | ICD-10-CM | POA: Diagnosis not present

## 2024-03-09 MED ORDER — ROMOSOZUMAB-AQQG 105 MG/1.17ML ~~LOC~~ SOSY
210.0000 mg | PREFILLED_SYRINGE | Freq: Once | SUBCUTANEOUS | Status: AC
  Administered 2024-03-09: 210 mg via SUBCUTANEOUS

## 2024-03-09 NOTE — Progress Notes (Signed)
 "  Office Visit Note   Patient: [name]           Date of Birth: 1955-11-24           MRN: [id number] Visit Date: 03/09/2024              Requested by: Joyce Norleen BROCKS, MD [address] [place],  [place] [address] PCP: Joyce Norleen BROCKS, MD  Chief Complaint  Patient presents with   Injections    Evenity        HPI: Patient returns today for her ninth Evenity  injection.  She is tolerating the previous ones in the past  Assessment & Plan: Visit Diagnoses:  1. Age-related osteoporosis without current pathological fracture     Plan: Will follow-up in 1 month.  Does not need labs.  Will talk to her about ordering a new bone scan at that time  Follow-Up Instructions: No follow-ups on file.   Ortho Exam  Patient is alert, oriented, no adenopathy, well-dressed, normal affect, normal respiratory effort.     Imaging: No results found. No images are attached to the encounter.  Labs: Lab Results  Component Value Date   ESRSEDRATE 2 07/24/2016   CRP 1.5 07/24/2016     Lab Results  Component Value Date   ALBUMIN 4.5 03/26/2022   ALBUMIN 4.8 03/19/2021   ALBUMIN 4.8 03/05/2020    No results found for: MG Lab Results  Component Value Date   VD25OH 56 04/13/2023   VD25OH 34.7 03/26/2022   VD25OH 46.7 10/16/2020    No results found for: PREALBUMIN    Latest Ref Rng & Units 08/02/2023    8:22 AM 02/17/2023   12:12 AM 03/26/2022   10:29 AM  CBC EXTENDED  WBC 4.0 - 10.5 K/uL 7.3  9.4  6.3   RBC 3.87 - 5.11 MIL/uL 5.29  4.91  5.12   Hemoglobin 12.0 - 15.0 g/dL 83.7  84.4  84.8   HCT 36.0 - 46.0 % 48.6  45.7  45.5   Platelets 150 - 400 K/uL 369  305  319   NEUT# 1.4 - 7.0 x10E3/uL   3.4   Lymph# 0.7 - 3.1 x10E3/uL   1.8      There is no height or weight on file to calculate BMI.  Orders:  No orders of the defined types were placed in this encounter.  Meds ordered this encounter  Medications   Romosozumab -aqqg (EVENITY ) 105 MG/1. injection  210 mg    Restricted to Outpatient Use.  Do Not Tube.     Procedures: No procedures performed  Clinical Data: No additional findings.  ROS:  All other systems negative, except as noted in the HPI. Review of Systems  Objective: Vital Signs: LMP  (LMP Unknown) Comment: sexually active  Specialty Comments:  CLINICAL DATA:  Closed fracture of L5 vertebra. Patient fell nine months ago with back pain over the last 7 months. Left buttock and hip pain.   EXAM: CT LUMBAR SPINE WITHOUT CONTRAST   TECHNIQUE: Multidetector CT imaging of the lumbar spine was performed without intravenous contrast administration. Multiplanar CT image reconstructions were also generated.   RADIATION DOSE REDUCTION: This exam was performed according to the departmental dose-optimization program which includes automated exposure control, adjustment of the mA and/or kV according to patient size and/or use of iterative reconstruction technique.   COMPARISON:  Cardiac CT 11/04/2023, lumbar MRI 08/18/2023 and CTA of the chest, abdomen and pelvis 08/02/2023.   FINDINGS: Segmentation: There are 5  lumbar type vertebral bodies.   Alignment: Moderate convex left scoliosis. No focal angulation or listhesis.   Vertebrae: Unchanged chronic T12 compression deformity. No acute lumbar spine vertebral body fractures are identified. A nondisplaced fracture involving the left L5 pedicle and transverse process on CT of 08/02/2023 appears healed in the interval (best seen on axial image 84/5).   Paraspinal and other soft tissues: No acute paraspinal findings are identified. Minimal aortoiliac atherosclerosis. Low-density lesion in the right hepatic lobe on image 7/6 has been previously identified and appears grossly unchanged.   Disc levels: No acute or significant disc space findings from T12-L1 through L3-4. The disc heights are maintained at these levels, and there is no significant spinal stenosis or  foraminal narrowing. At L4-5, there is chronic loss of disc height with annular disc bulging, endplate osteophytes and asymmetric left-sided facet hypertrophy. Resulting mild left lateral recess and mild to moderate left foraminal narrowing appears unchanged from previous MRI. At L5-S1, there is mild loss of disc height with disc bulging and endplate osteophytes asymmetric to the left. Advanced asymmetric facet hypertrophy on the left with osseous fragmentation and mild asymmetric narrowing of the left lateral recess and left foramen, also grossly stable.   IMPRESSION: 1. No acute osseous findings are identified in the lumbar spine. 2. Interval healing of nondisplaced fracture involving the left L5 pedicle and transverse process. 3. Stable chronic T12 compression deformity. 4. Stable lower lumbar spondylosis with asymmetric left-sided facet hypertrophy at L4-5 and L5-S1 contributing to mild left lateral recess and left foraminal narrowing. 5.  Aortic Atherosclerosis (ICD10-I70.0).     Electronically Signed   By: Elsie Perone M.D.   On: 12/01/2023 11:21  PMFS History: Patient Active Problem List   Diagnosis Date Noted   Abdominal pain, epigastric 02/19/2024   Hyperlipidemia 03/25/2022   Herpes labialis 02/16/2021   Gastroesophageal reflux disease without esophagitis 02/15/2018   Lactose intolerance 02/15/2018   History of vitamin D  deficiency 02/15/2018   Age-related osteoporosis without current pathological fracture 02/15/2018   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   Spondylosis 05/26/2007   Disorder of gallbladder 03/11/2007   Past Medical History:  Diagnosis Date   Anxiety    very high   GERD (gastroesophageal reflux disease)    Glaucoma    Heart murmur    Pt saw Cardiologist when she was in her 69s, not since.   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   Osteoporosis    Scoliosis     Family  History  Problem Relation Age of Onset   Arthritis Mother    Hyperlipidemia Mother    Atrial fibrillation Mother    Skin cancer Father    Depression Father    Parkinson's disease Father    CAD Father    Colon cancer Neg Hx    Colon polyps Neg Hx    Esophageal cancer Neg Hx    Stomach cancer Neg Hx    Rectal cancer Neg Hx     Past Surgical History:  Procedure Laterality Date   CESAREAN SECTION  1988   SHOULDER ARTHROSCOPY WITH SUBACROMIAL DECOMPRESSION AND OPEN ROTATOR C Right 12/09/2013   Procedure: RIGHT SHOULDER ARTHROSCOPY WITH SUBACROMIAL DECOMPRESSION AND MINI OPEN ROTATOR CUFF REPAIR, AND BICEPS TENDODESIS;  Surgeon: Elspeth JONELLE Her, MD;  Location: [facility] OR;  Service: Orthopedics;  Laterality: Right;   TONSILLECTOMY     Social History  Occupational History   Occupation: Management/Librarian  Tobacco Use   Smoking status: Never   Smokeless tobacco: Never  Vaping Use   Vaping status: Never Used  Substance and Sexual Activity   Alcohol use: Yes    Comment: occ   Drug use: No   Sexual activity: Yes    Partners: Male    Birth control/protection: Post-menopausal    Comment: 1st intercourse- 23, partner- 1       "

## 2024-03-15 ENCOUNTER — Encounter: Payer: Self-pay | Admitting: Obstetrics and Gynecology

## 2024-03-15 ENCOUNTER — Ambulatory Visit (INDEPENDENT_AMBULATORY_CARE_PROVIDER_SITE_OTHER): Admitting: Obstetrics and Gynecology

## 2024-03-15 ENCOUNTER — Other Ambulatory Visit (HOSPITAL_COMMUNITY)
Admission: RE | Admit: 2024-03-15 | Discharge: 2024-03-15 | Disposition: A | Discharge status: Home / Self Care | Source: Ambulatory Visit | Attending: Obstetrics and Gynecology | Admitting: Obstetrics and Gynecology

## 2024-03-15 VITALS — BP 102/62 | HR 88 | Temp 98.1°F | Ht 63.75 in | Wt 142.0 lb

## 2024-03-15 DIAGNOSIS — Z01419 Encounter for gynecological examination (general) (routine) without abnormal findings: Secondary | ICD-10-CM | POA: Diagnosis not present

## 2024-03-15 DIAGNOSIS — Z124 Encounter for screening for malignant neoplasm of cervix: Secondary | ICD-10-CM | POA: Insufficient documentation

## 2024-03-15 DIAGNOSIS — Z1331 Encounter for screening for depression: Secondary | ICD-10-CM | POA: Diagnosis not present

## 2024-03-15 DIAGNOSIS — N952 Postmenopausal atrophic vaginitis: Secondary | ICD-10-CM

## 2024-03-15 NOTE — Assessment & Plan Note (Signed)
 Cervical cancer screening performed according to ASCCP guidelines. Wants to discontinue PAP if normal this year Encouraged annual mammogram screening Cologuard due this month, f/u with PCP DXA UTD, f/u with ortho Labs and immunizations with her primary Encouraged safe sexual practices as indicated Encouraged healthy lifestyle practices with diet and exercise For patients under 50-68yo, I recommend 1200mg  calcium daily and 600IU of vitamin D  daily.

## 2024-03-15 NOTE — Progress Notes (Signed)
 "  69 y.o. [id number] postmenopausal female with osteoporosis (managed by Ortho on [address] ), [facility] here for B&P exam. Married. Retired comptroller, works management consultant. PCP: Joyce Norleen BROCKS, MD   She reports no concerns today. Vagifem  prescribed by PCP  Urine sample provided: No  Postmenopausal bleeding: none Pelvic discharge or pain: none Breast mass, nipple discharge or skin changes : none Sexually active: Yes   Last PAP: 12/02/18 NIL Last mammogram: 08/07/22 Birads 1, Density B, did not go in 2025 due to rib fracture after fall, planning to reschedule Last DXA: 09/24/22 T-score -3.0, managed by PCP and ortho Last colonoscopy: 02/26/18; cologuard 03/2021 negative  Exercising: Yes, walking daily 30 mins Smoker:No  Flowsheet Row Office Visit from 03/15/2024 in [facility]  PHQ-2 Total Score 0       GYN HISTORY: Hx of ankle and rib fracture  OB History  Gravida Para Term Preterm AB Living  2 2 2   2   SAB IAB Ectopic Multiple Live Births      2    # Outcome Date GA Lbr Len/2nd Weight Sex Type Anes PTL Lv  2 Term      Vag-Spont   LIV  1 Term      CS-Unspec   LIV   Past Medical History:  Diagnosis Date   Anxiety    very high   GERD (gastroesophageal reflux disease)    Glaucoma    Heart murmur    Pt saw Cardiologist when she was in her 79s, not since.   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   Osteoporosis    Scoliosis    Past Surgical History:  Procedure Laterality Date   CESAREAN SECTION  1988   SHOULDER ARTHROSCOPY WITH SUBACROMIAL DECOMPRESSION AND OPEN ROTATOR C Right 12/09/2013   Procedure: RIGHT SHOULDER ARTHROSCOPY WITH SUBACROMIAL DECOMPRESSION AND MINI OPEN ROTATOR CUFF REPAIR, AND BICEPS TENDODESIS;  Surgeon: Elspeth JONELLE Her, MD;  Location: [facility] OR;  Service: Orthopedics;  Laterality: Right;   TONSILLECTOMY     Medications Ordered Prior to Encounter[1] Social  History   Socioeconomic History   Marital status: Married    Spouse name: Not on file   Number of children: 2   Years of education: Masters   Highest education level: Not on file  Occupational History   Occupation: Management/Librarian  Tobacco Use   Smoking status: Never   Smokeless tobacco: Never  Vaping Use   Vaping status: Never Used  Substance and Sexual Activity   Alcohol use: Yes    Comment: occ   Drug use: No   Sexual activity: Yes    Partners: Male    Birth control/protection: Post-menopausal    Comment: 1st intercourse- 23, partner- 1  Other Topics Concern   Not on file  Social History Narrative   Lives at home with her husband.   Right-handed.   6-8 cups caffeine per day.   Social Drivers of Health   Tobacco Use: Low Risk (03/15/2024)   Patient History    Smoking Tobacco Use: Never    Smokeless Tobacco Use: Never    Passive Exposure: Not on file  Financial Resource Strain: Low Risk (06/02/2023)   Overall Financial Resource Strain (CARDIA)    Difficulty of Paying Living Expenses: Not hard at all  Food Insecurity: No Food Insecurity (06/02/2023)   Hunger Vital Sign    Worried About  Running Out of Food in the Last Year: Never true    Ran Out of Food in the Last Year: Never true  Transportation Needs: No Transportation Needs (06/02/2023)   PRAPARE - Administrator, Civil Service (Medical): No    Lack of Transportation (Non-Medical): No  Physical Activity: Insufficiently Active (06/02/2023)   Exercise Vital Sign    Days of Exercise per Week: 2 days    Minutes of Exercise per Session: 20 min  Stress: Stress Concern Present (06/02/2023)   [employer] of Occupational Health - Occupational Stress Questionnaire    Feeling of Stress : Rather much  Social Connections: Moderately Integrated (06/02/2023)   Social Connection and Isolation Panel    Frequency of Communication with Friends and Family: More than three times a week    Frequency of Social  Gatherings with Friends and Family: Once a week    Attends Religious Services: More than 4 times per year    Active Member of [employer] or Organizations: No    Attends Engineer, Structural: Not on file    Marital Status: Married  Catering Manager Violence: Not At Risk (06/02/2023)   Humiliation, Afraid, Rape, and Kick questionnaire    Fear of Current or Ex-Partner: No    Emotionally Abused: No    Physically Abused: No    Sexually Abused: No  Depression (PHQ2-9): Low Risk (03/15/2024)   Depression (PHQ2-9)    PHQ-2 Score: 0  Alcohol Screen: Not on file  Housing: Low Risk (06/02/2023)   Housing Stability Vital Sign    Unable to Pay for Housing in the Last Year: No    Number of Times Moved in the Last Year: 0    Homeless in the Last Year: No  Utilities: Not At Risk (06/02/2023)   AHC Utilities    Threatened with loss of utilities: No  Health Literacy: Not on file   Family History  Problem Relation Age of Onset   Arthritis Mother    Hyperlipidemia Mother    Atrial fibrillation Mother    Skin cancer Father    Depression Father    Parkinson's disease Father    CAD Father    Colon cancer Neg Hx    Colon polyps Neg Hx    Esophageal cancer Neg Hx    Stomach cancer Neg Hx    Rectal cancer Neg Hx    Allergies[2]    PE Today's Vitals   03/15/24 1123  BP: 102/62  Pulse: 88  Temp: 98.1 F (36.7 C)  TempSrc: Oral  SpO2: 97%  Weight: 142 lb (64.4 kg)  Height: 5' 3.75 (1.619 m)   Body mass index is 24.57 kg/m.  Physical Exam Vitals reviewed. Exam conducted with a chaperone present.  Constitutional:      General: She is not in acute distress.    Appearance: Normal appearance.  HENT:     Head: Normocephalic and atraumatic.     Nose: Nose normal.  Eyes:     Extraocular Movements: Extraocular movements intact.     Conjunctiva/sclera: Conjunctivae normal.  Pulmonary:     Effort: Pulmonary effort is normal.  Chest:     Chest wall: No mass or tenderness.  Breasts:     Right: Normal. No swelling, mass, nipple discharge, skin change or tenderness.     Left: Normal. No swelling, mass, nipple discharge, skin change or tenderness.  Abdominal:     General: There is no distension.     Palpations: Abdomen is soft.  Tenderness: There is no abdominal tenderness.  Genitourinary:    Exam position: Lithotomy position.     Urethra: No prolapse.     Vagina: Normal. No vaginal discharge or bleeding.     Cervix: Normal. No lesion.     Uterus: Normal. Not enlarged and not tender.      Adnexa: Right adnexa normal and left adnexa normal.     Comments: Vaginal atrophy Pain with Andra, non with teen spec Musculoskeletal:        General: Normal range of motion.     Cervical back: Normal range of motion.  Lymphadenopathy:     Upper Body:     Right upper body: No axillary adenopathy.     Left upper body: No axillary adenopathy.     Lower Body: No right inguinal adenopathy. No left inguinal adenopathy.  Skin:    General: Skin is warm and dry.  Neurological:     General: No focal deficit present.     Mental Status: She is alert.  Psychiatric:        Mood and Affect: Mood normal.        Behavior: Behavior normal.      Assessment and Plan:        Encounter for breast and pelvic examination Assessment & Plan: Cervical cancer screening performed according to ASCCP guidelines. Wants to discontinue PAP if normal this year Encouraged annual mammogram screening Cologuard due this month, f/u with PCP DXA UTD, f/u with ortho Labs and immunizations with her primary Encouraged safe sexual practices as indicated Encouraged healthy lifestyle practices with diet and exercise For patients under 50-70yo, I recommend 1200mg  calcium daily and 600IU of vitamin D  daily.    Negative depression screening  Cervical cancer screening -     Cytology - PAP   Dietrick Barris LULLA Pa, MD       [1]  Current Outpatient Medications on File Prior to Visit  Medication Sig Dispense  Refill   acetaminophen  (TYLENOL ) 325 MG tablet Take 325 mg by mouth every 6 (six) hours as needed.     aspirin EC 81 MG tablet Take 81 mg by mouth daily.     Cholecalciferol (VITAMIN D ) 2000 UNITS CAPS Take 2,000 Units by mouth daily.     cycloSPORINE (RESTASIS) 0.05 % ophthalmic emulsion Place 1 drop into both eyes 2 (two) times daily.     Estradiol  (YUVAFEM ) 10 MCG TABS vaginal tablet PLACE 1 INSERT VAGINALLY TWICE WEEKLY ON TUESDAYS AND FRIDAYS 24 tablet 2   famotidine  (PEPCID ) 20 MG tablet Take 1 tablet (20 mg total) by mouth 2 (two) times daily. 60 tablet 5   gabapentin  (NEURONTIN ) 300 MG capsule Take 300 mg by mouth 2 (two) times daily.     ibuprofen  (ADVIL ) 600 MG tablet Take 1 tablet (600 mg total) by mouth every 6 (six) hours as needed. 30 tablet 0   latanoprost (XALATAN) 0.005 % ophthalmic solution Place 1 drop into both eyes at bedtime.     Omega-3 Fatty Acids (FISH OIL PO) Take 1 capsule by mouth daily.     pseudoephedrine  (SUDAFED) 30 MG tablet Take 1 tablet (30 mg total) by mouth every 8 (eight) hours as needed for congestion. 30 tablet 0   Romosozumab -aqqg (EVENITY ) 105 MG/1. SOSY injection      No current facility-administered medications on file prior to visit.  [2] No Known Allergies  "

## 2024-03-15 NOTE — Patient Instructions (Signed)
 For patients under 50-68yo, I recommend 1200mg  calcium  daily and 600IU of vitamin D daily. For patients over 68yo, I recommend 1200mg  calcium  daily and 800IU of vitamin D daily.  Health Maintenance, Female Adopting a healthy lifestyle and getting preventive care are important in promoting health and wellness. Ask your health care provider about: The right schedule for you to have regular tests and exams. Things you can do on your own to prevent diseases and keep yourself healthy. What should I know about diet, weight, and exercise? Eat a healthy diet  Eat a diet that includes plenty of vegetables, fruits, low-fat dairy products, and lean protein. Do not eat a lot of foods that are high in solid fats, added sugars, or sodium. Maintain a healthy weight Body mass index (BMI) is used to identify weight problems. It estimates body fat based on height and weight. Your health care provider can help determine your BMI and help you achieve or maintain a healthy weight. Get regular exercise Get regular exercise. This is one of the most important things you can do for your health. Most adults should: Exercise for at least 150 minutes each week. The exercise should increase your heart rate and make you sweat (moderate-intensity exercise). Do strengthening exercises at least twice a week. This is in addition to the moderate-intensity exercise. Spend less time sitting. Even light physical activity can be beneficial. Watch cholesterol and blood lipids Have your blood tested for lipids and cholesterol at 68 years of age, then have this test every 5 years. Have your cholesterol levels checked more often if: Your lipid or cholesterol levels are high. You are older than 68 years of age. You are at high risk for heart disease. What should I know about cancer screening? Depending on your health history and family history, you may need to have cancer screening at various ages. This may include screening  for: Breast cancer. Cervical cancer. Colorectal cancer. Skin cancer. Lung cancer. What should I know about heart disease, diabetes, and high blood pressure? Blood pressure and heart disease High blood pressure causes heart disease and increases the risk of stroke. This is more likely to develop in people who have high blood pressure readings or are overweight. Have your blood pressure checked: Every 3-5 years if you are 25-57 years of age. Every year if you are 24 years old or older. Diabetes Have regular diabetes screenings. This checks your fasting blood sugar level. Have the screening done: Once every three years after age 62 if you are at a normal weight and have a low risk for diabetes. More often and at a younger age if you are overweight or have a high risk for diabetes. What should I know about preventing infection? Hepatitis B If you have a higher risk for hepatitis B, you should be screened for this virus. Talk with your health care provider to find out if you are at risk for hepatitis B infection. Hepatitis C Testing is recommended for: Everyone born from [birth year] through 1965. Anyone with known risk factors for hepatitis C. Sexually transmitted infections (STIs) Get screened for STIs, including gonorrhea and chlamydia, if: You are sexually active and are younger than 68 years of age. You are older than 68 years of age and your health care provider tells you that you are at risk for this type of infection. Your sexual activity has changed since you were last screened, and you are at increased risk for chlamydia or gonorrhea. Ask your health care provider if  you are at risk. Ask your health care provider about whether you are at high risk for HIV. Your health care provider may recommend a prescription medicine to help prevent HIV infection. If you choose to take medicine to prevent HIV, you should first get tested for HIV. You should then be tested every 3 months for as long as you  are taking the medicine. Osteoporosis and menopause Osteoporosis is a disease in which the bones lose minerals and strength with aging. This can result in bone fractures. If you are 72 years old or older, or if you are at risk for osteoporosis and fractures, ask your health care provider if you should: Be screened for bone loss. Take a calcium  or vitamin D supplement to lower your risk of fractures. Be given hormone replacement therapy (HRT) to treat symptoms of menopause. Follow these instructions at home: Alcohol use Do not drink alcohol if: Your health care provider tells you not to drink. You are pregnant, may be pregnant, or are planning to become pregnant. If you drink alcohol: Limit how much you have to: 0-1 drink a day. Know how much alcohol is in your drink. In the [place] one drink equals one 12 oz bottle of beer (355 mL), one 5 oz glass of wine (148 mL), or one 1 oz glass of hard liquor (44 mL). Lifestyle Do not use any products that contain nicotine or tobacco. These products include cigarettes, chewing tobacco, and vaping devices, such as e-cigarettes. If you need help quitting, ask your health care provider. Do not use street drugs. Do not share needles. Ask your health care provider for help if you need support or information about quitting drugs. General instructions Schedule regular health, dental, and eye exams. Stay current with your vaccines. Tell your health care provider if: You often feel depressed. You have ever been abused or do not feel safe at home. Summary Adopting a healthy lifestyle and getting preventive care are important in promoting health and wellness. Follow your health care provider's instructions about healthy diet, exercising, and getting tested or screened for diseases. Follow your health care provider's instructions on monitoring your cholesterol and blood pressure. This information is not intended to replace advice given to you by your health  care provider. Make sure you discuss any questions you have with your health care provider. Document Revised: 07/16/2020 Document Reviewed: 07/16/2020 Elsevier Patient Education  2024 [employer].

## 2024-03-17 ENCOUNTER — Ambulatory Visit (HOSPITAL_BASED_OUTPATIENT_CLINIC_OR_DEPARTMENT_OTHER): Payer: Self-pay | Admitting: Obstetrics and Gynecology

## 2024-03-17 LAB — CYTOLOGY - PAP: Diagnosis: NEGATIVE

## 2024-04-05 ENCOUNTER — Ambulatory Visit: Payer: Self-pay | Admitting: *Deleted

## 2024-04-05 ENCOUNTER — Ambulatory Visit: Admitting: Family Medicine

## 2024-04-05 ENCOUNTER — Encounter: Payer: Self-pay | Admitting: Family Medicine

## 2024-04-05 VITALS — BP 108/62 | HR 80 | Temp 100.0°F | Ht 63.5 in | Wt 142.4 lb

## 2024-04-05 DIAGNOSIS — R52 Pain, unspecified: Secondary | ICD-10-CM

## 2024-04-05 DIAGNOSIS — R058 Other specified cough: Secondary | ICD-10-CM

## 2024-04-05 DIAGNOSIS — R509 Fever, unspecified: Secondary | ICD-10-CM

## 2024-04-05 DIAGNOSIS — R6883 Chills (without fever): Secondary | ICD-10-CM | POA: Diagnosis not present

## 2024-04-05 MED ORDER — ONDANSETRON 4 MG PO TBDP: 4.0000 mg | ORAL_TABLET | Freq: Three times a day (TID) | ORAL | 0 refills | Status: AC | PRN

## 2024-04-05 NOTE — Progress Notes (Signed)
" ° °  Subjective:    Patient ID: [name], female    DOB: 11-10-1955, 69 y.o.   MRN: [id number]  Discussed the use of AI scribe software for clinical note transcription with the patient, who gave verbal consent to proceed.  History of Present Illness   [name] is a 69 year old female who presents with nausea, sore throat, and cough.  Her symptoms began on Saturday night with a scratchy throat, which progressed to a painful, scratchy cough during the night. She has experienced significant nausea, describing it as severe and unbearable, primarily occurring at bedtime. The nausea was present for two consecutive nights and again yesterday morning, but not today. She has not been taking any medication for nausea as she is unsure of what to take.  She experienced one episode of diarrhea, described as 'like water,' which occurred yesterday. She has been taking Advil  as directed for aches and headaches, which provided relief, but she is unsure if it contributed to the diarrhea. Her appetite has decreased, and she reports her stomach 'churning' and 'growling.'  No current nausea.           Review of Systems     Objective:    Physical Exam Alert and in no distress. Tympanic membranes and canals are normal. Pharyngeal area is normal. Neck is supple without adenopathy or thyromegaly. Cardiac exam shows a regular sinus rhythm without murmurs or gallops. Lungs are clear to auscultation. Abdominal exam shows no masses or tenderness with decreased bowel sounds             Assessment & Plan:  Assessment and Plan    Acute viral syndrome with respiratory and gastrointestinal symptoms Acute viral syndrome with sore throat, cough, fever, nausea, and diarrhea. Negative for influenza A, B, and COVID-19. Likely non-specific viral infection. - Continue symptomatic treatment with acetaminophen , ibuprofen , or naproxen as needed for fever and body aches. - Advised to monitor symptoms and  contact via MyChart if condition worsens.  Nausea Intermittent severe nausea, not typical of common viral infections, suggesting variant viral presentation. - Recommended over-the-counter Emetrol. - Prescribed ondansetron  (Zofran ) as needed.        "

## 2024-04-05 NOTE — Telephone Encounter (Signed)
 FYI Only or Action Required?: FYI only for provider: appointment scheduled on 1/27.  Patient was last seen in primary care on 07/30/2023 by Joyce Norleen BROCKS, MD.  Called Nurse Triage reporting Influenza.  Symptoms began several days ago.  Interventions attempted: OTC medications: tylenol .  Symptoms are: slightly better today.  Triage Disposition: See Physician Within 24 Hours  Patient/caregiver understands and will follow disposition?: Yes   Message from [place] S sent at 04/05/2024  9:20 AM EST  Reason for Triage: flu like symptoms: aching, fatigue, fever, cough, nauseous at night   Reason for Disposition  Fever present > 3 days (72 hours)  Answer Assessment - Initial Assessment Questions 1. SYMPTOMS: What is your main symptom or concern? (e.g., cough, fever, shortness of breath, muscle aches)    Nausea    Fatigue, fever, cough 2. ONSET: When did the symptoms start?      Saturday night 3. COUGH: Do you have a cough? If Yes, ask: How bad is the cough?       Yes- using Delsym, dry 4. FEVER: Do you have a fever? If Yes, ask: What is your temperature, how was it measured, and when did it start?     Yes- 99-100- sometimes normal 5. BREATHING DIFFICULTY: Are you having any difficulty breathing? (e.g., normal; shortness of breath, wheezing, unable to speak)      no 6. BETTER-SAME-WORSE: Are you getting better, staying the same or getting worse compared to yesterday?  If getting worse, ask, In what way?     Slightly better 7. OTHER SYMPTOMS: Do you have any other symptoms?  (e.g., chills, fatigue, headache, loss of smell or taste, muscle pain, sore throat)     Nausea, body aches, diarrhea 8. INFLUENZA EXPOSURE: Was there any known exposure to influenza (flu) before the symptoms began?      no 9. INFLUENZA SUSPECTED: Why do you think you have influenza? (e.g., positive flu self-test at home, symptoms after exposure).     symptoms 10. INFLUENZA VACCINE: Have  you had the flu vaccine? If Yes, ask: When did you last get it?       yes 11. HIGH RISK FOR COMPLICATIONS: Do you have any chronic medical problems? (e.g., asthma, heart or lung disease, obesity, weak immune system)       age  Protocols used: Influenza (Flu) Suspected-A-AH

## 2024-04-06 ENCOUNTER — Telehealth: Payer: Self-pay | Admitting: Internal Medicine

## 2024-04-06 NOTE — Telephone Encounter (Signed)
 Medication is not covered by patient's plan  Copied from CRM #[id number]. Topic: Clinical - Prescription Issue >> Apr 06, 2024  8:11 AM Donna BRAVO wrote: Reason for CRM: pharmacist stated the insurance company requires a prior auth for ondansetron  (ZOFRAN -ODT) 4 MG disintegrating tablet

## 2024-04-08 ENCOUNTER — Other Ambulatory Visit (HOSPITAL_COMMUNITY): Payer: Self-pay

## 2024-04-08 ENCOUNTER — Ambulatory Visit: Admitting: Gastroenterology

## 2024-04-08 NOTE — Telephone Encounter (Signed)
 Pharmacy Patient Advocate Encounter   Received notification from Physician's Office that prior authorization for Ondansetron  4 mg odt is required/requested.   Insurance verification completed.   The patient is insured through [facility] ADVANTAGE/RX ADVANCE.   Per test claim: Per test claim, medication is not covered due to plan/benefit exclusion, PA not submitted at this time  Pt must have cancer diagnoses

## 2024-04-11 NOTE — Telephone Encounter (Signed)
 Pt was notified.   She is doing ok, nausea has subsided. Patient just has a cough now that hasn't gone away but doing better

## 2024-04-12 ENCOUNTER — Other Ambulatory Visit: Payer: Self-pay

## 2024-04-12 ENCOUNTER — Ambulatory Visit: Admitting: Physician Assistant

## 2024-04-12 VITALS — Ht 63.5 in | Wt 142.4 lb

## 2024-04-12 DIAGNOSIS — M810 Age-related osteoporosis without current pathological fracture: Secondary | ICD-10-CM | POA: Diagnosis not present

## 2024-04-12 NOTE — Progress Notes (Unsigned)
 "  Office Visit Note   Patient: [name]           Date of Birth: [birth year]           MRN: [id number] Visit Date: 04/12/2024              Requested by: Joyce Norleen BROCKS, MD [address] [place],  [place] [address] PCP: Joyce Norleen BROCKS, MD  No chief complaint on file.     HPI: Patient comes in today for her 10th Evenity  injection she is tolerated them quite well has had no problems.  Assessment & Plan: Visit Diagnoses:  1. Age-related osteoporosis without current pathological fracture     Plan: Will go forward with injection in 1 month.  Follow-Up Instructions: No follow-ups on file.   Ortho Exam  Patient is alert, oriented, no adenopathy, well-dressed, normal affect, normal respiratory effort.     Imaging: No results found. No images are attached to the encounter.  Labs: Lab Results  Component Value Date   ESRSEDRATE 2 07/24/2016   CRP 1.5 07/24/2016     Lab Results  Component Value Date   ALBUMIN 4.5 03/26/2022   ALBUMIN 4.8 03/19/2021   ALBUMIN 4.8 03/05/2020    No results found for: MG Lab Results  Component Value Date   VD25OH 56 04/13/2023   VD25OH 34.7 03/26/2022   VD25OH 46.7 10/16/2020    No results found for: PREALBUMIN    Latest Ref Rng & Units 08/02/2023    8:22 AM 02/17/2023   12:12 AM 03/26/2022   10:29 AM  CBC EXTENDED  WBC 4.0 - 10.5 K/uL 7.3  9.4  6.3   RBC 3.87 - 5.11 MIL/uL 5.29  4.91  5.12   Hemoglobin 12.0 - 15.0 g/dL 83.7  84.4  84.8   HCT 36.0 - 46.0 % 48.6  45.7  45.5   Platelets 150 - 400 K/uL 369  305  319   NEUT# 1.4 - 7.0 x10E3/uL   3.4   Lymph# 0.7 - 3.1 x10E3/uL   1.8      There is no height or weight on file to calculate BMI.  Orders:  No orders of the defined types were placed in this encounter.  No orders of the defined types were placed in this encounter.    Procedures: No procedures performed  Clinical Data: No additional findings.  ROS:  All other systems negative, except as  noted in the HPI. Review of Systems  Objective: Vital Signs: LMP  (LMP Unknown) Comment: sexually active  Specialty Comments:  CLINICAL DATA:  Closed fracture of L5 vertebra. Patient fell nine months ago with back pain over the last 7 months. Left buttock and hip pain.   EXAM: CT LUMBAR SPINE WITHOUT CONTRAST   TECHNIQUE: Multidetector CT imaging of the lumbar spine was performed without intravenous contrast administration. Multiplanar CT image reconstructions were also generated.   RADIATION DOSE REDUCTION: This exam was performed according to the departmental dose-optimization program which includes automated exposure control, adjustment of the mA and/or kV according to patient size and/or use of iterative reconstruction technique.   COMPARISON:  Cardiac CT 11/04/2023, lumbar MRI 08/18/2023 and CTA of the chest, abdomen and pelvis 08/02/2023.   FINDINGS: Segmentation: There are 5 lumbar type vertebral bodies.   Alignment: Moderate convex left scoliosis. No focal angulation or listhesis.   Vertebrae: Unchanged chronic T12 compression deformity. No acute lumbar spine vertebral body fractures are identified. A nondisplaced fracture involving the left L5 pedicle  and transverse process on CT of 08/02/2023 appears healed in the interval (best seen on axial image 84/5).   Paraspinal and other soft tissues: No acute paraspinal findings are identified. Minimal aortoiliac atherosclerosis. Low-density lesion in the right hepatic lobe on image 7/6 has been previously identified and appears grossly unchanged.   Disc levels: No acute or significant disc space findings from T12-L1 through L3-4. The disc heights are maintained at these levels, and there is no significant spinal stenosis or foraminal narrowing. At L4-5, there is chronic loss of disc height with annular disc bulging, endplate osteophytes and asymmetric left-sided facet hypertrophy. Resulting mild left lateral recess  and mild to moderate left foraminal narrowing appears unchanged from previous MRI. At L5-S1, there is mild loss of disc height with disc bulging and endplate osteophytes asymmetric to the left. Advanced asymmetric facet hypertrophy on the left with osseous fragmentation and mild asymmetric narrowing of the left lateral recess and left foramen, also grossly stable.   IMPRESSION: 1. No acute osseous findings are identified in the lumbar spine. 2. Interval healing of nondisplaced fracture involving the left L5 pedicle and transverse process. 3. Stable chronic T12 compression deformity. 4. Stable lower lumbar spondylosis with asymmetric left-sided facet hypertrophy at L4-5 and L5-S1 contributing to mild left lateral recess and left foraminal narrowing. 5.  Aortic Atherosclerosis (ICD10-I70.0).     Electronically Signed   By: Elsie Perone M.D.   On: 12/01/2023 11:21  PMFS History: Patient Active Problem List   Diagnosis Date Noted   Encounter for breast and pelvic examination 03/15/2024   Abdominal pain, epigastric 02/19/2024   Hyperlipidemia 03/25/2022   Herpes labialis 02/16/2021   Gastroesophageal reflux disease without esophagitis 02/15/2018   Lactose intolerance 02/15/2018   History of vitamin D  deficiency 02/15/2018   Age-related osteoporosis without current pathological fracture 02/15/2018   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   Spondylosis 05/26/2007   Disorder of gallbladder 03/11/2007   Past Medical History:  Diagnosis Date   Anxiety    very high   GERD (gastroesophageal reflux disease)    Glaucoma    Heart murmur    Pt saw Cardiologist when she was in her 1s, not since.   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   Osteoporosis    Scoliosis     Family History  Problem Relation Age of Onset   Arthritis Mother    Hyperlipidemia Mother    Atrial fibrillation Mother    Skin cancer Father     Depression Father    Parkinson's disease Father    CAD Father    Colon cancer Neg Hx    Colon polyps Neg Hx    Esophageal cancer Neg Hx    Stomach cancer Neg Hx    Rectal cancer Neg Hx     Past Surgical History:  Procedure Laterality Date   CESAREAN SECTION  1988   SHOULDER ARTHROSCOPY WITH SUBACROMIAL DECOMPRESSION AND OPEN ROTATOR C Right 12/09/2013   Procedure: RIGHT SHOULDER ARTHROSCOPY WITH SUBACROMIAL DECOMPRESSION AND MINI OPEN ROTATOR CUFF REPAIR, AND BICEPS TENDODESIS;  Surgeon: Elspeth JONELLE Her, MD;  Location: [facility] OR;  Service: Orthopedics;  Laterality: Right;   TONSILLECTOMY     Social History   Occupational History   Occupation: Management/Librarian  Tobacco Use   Smoking status: Never   Smokeless tobacco: Never  Vaping Use   Vaping status: Never Used  Substance and Sexual  Activity   Alcohol use: Yes    Comment: occ   Drug use: No   Sexual activity: Yes    Partners: Male    Birth control/protection: Post-menopausal    Comment: 1st intercourse- 23, partner- 1       "

## 2024-04-12 NOTE — Progress Notes (Unsigned)
 Bilateral upper arm subcutaneous injections. Evenity  #10

## 2024-04-13 MED ORDER — ROMOSOZUMAB-AQQG 105 MG/1.17ML ~~LOC~~ SOSY
210.0000 mg | PREFILLED_SYRINGE | SUBCUTANEOUS | Status: AC
  Administered 2024-04-12: 210 mg via SUBCUTANEOUS

## 2024-04-29 ENCOUNTER — Ambulatory Visit: Admitting: Gastroenterology

## 2024-05-13 ENCOUNTER — Ambulatory Visit: Admitting: Physician Assistant

## 2024-06-03 ENCOUNTER — Ambulatory Visit: Payer: Self-pay | Admitting: Family Medicine
